# Patient Record
Sex: Female | Born: 1965 | Race: White | Hispanic: No | Marital: Married | Smoking: Never smoker
Health system: Southern US, Community
[De-identification: ages and names within clinical notes are randomized; demographics above are authoritative.]

## PROBLEM LIST (undated history)

## (undated) ENCOUNTER — Ambulatory Visit
Admission: EM | Disposition: A | Discharge status: Other Acute Inpatient Hospital | Payer: No Typology Code available for payment source

## (undated) DIAGNOSIS — M1990 Unspecified osteoarthritis, unspecified site: Secondary | ICD-10-CM

## (undated) DIAGNOSIS — R9431 Abnormal electrocardiogram [ECG] [EKG]: Secondary | ICD-10-CM

## (undated) DIAGNOSIS — I4711 Inappropriate sinus tachycardia, so stated: Secondary | ICD-10-CM

## (undated) DIAGNOSIS — G43909 Migraine, unspecified, not intractable, without status migrainosus: Secondary | ICD-10-CM

## (undated) DIAGNOSIS — A498 Other bacterial infections of unspecified site: Secondary | ICD-10-CM

## (undated) DIAGNOSIS — M329 Systemic lupus erythematosus, unspecified: Secondary | ICD-10-CM

## (undated) DIAGNOSIS — I5189 Other ill-defined heart diseases: Secondary | ICD-10-CM

## (undated) DIAGNOSIS — T7840XA Allergy, unspecified, initial encounter: Secondary | ICD-10-CM

## (undated) DIAGNOSIS — R002 Palpitations: Secondary | ICD-10-CM

## (undated) DIAGNOSIS — R000 Tachycardia, unspecified: Secondary | ICD-10-CM

## (undated) DIAGNOSIS — K219 Gastro-esophageal reflux disease without esophagitis: Secondary | ICD-10-CM

## (undated) DIAGNOSIS — K259 Gastric ulcer, unspecified as acute or chronic, without hemorrhage or perforation: Secondary | ICD-10-CM

## (undated) DIAGNOSIS — J45909 Unspecified asthma, uncomplicated: Secondary | ICD-10-CM

## (undated) DIAGNOSIS — A630 Anogenital (venereal) warts: Secondary | ICD-10-CM

## (undated) DIAGNOSIS — F32A Depression, unspecified: Secondary | ICD-10-CM

## (undated) DIAGNOSIS — F329 Major depressive disorder, single episode, unspecified: Secondary | ICD-10-CM

## (undated) HISTORY — DX: Other ill-defined heart diseases: I51.89

## (undated) HISTORY — DX: Unspecified osteoarthritis, unspecified site: M19.90

## (undated) HISTORY — DX: Other bacterial infections of unspecified site: A49.8

## (undated) HISTORY — DX: Depression, unspecified: F32.A

## (undated) HISTORY — DX: Migraine, unspecified, not intractable, without status migrainosus: G43.909

## (undated) HISTORY — DX: Inappropriate sinus tachycardia, so stated: I47.11

## (undated) HISTORY — PX: UMBILICAL HERNIA REPAIR: SHX196

## (undated) HISTORY — DX: Tachycardia, unspecified: R00.0

## (undated) HISTORY — DX: Unspecified asthma, uncomplicated: J45.909

## (undated) HISTORY — DX: Anogenital (venereal) warts: A63.0

## (undated) HISTORY — DX: Abnormal electrocardiogram (ECG) (EKG): R94.31

## (undated) HISTORY — DX: Systemic lupus erythematosus, unspecified: M32.9

## (undated) HISTORY — DX: Gastric ulcer, unspecified as acute or chronic, without hemorrhage or perforation: K25.9

## (undated) HISTORY — DX: Palpitations: R00.2

## (undated) HISTORY — DX: Gastro-esophageal reflux disease without esophagitis: K21.9

## (undated) HISTORY — PX: ENDOSCOPIC VEIN LASER TREATMENT: SHX1508

## (undated) HISTORY — DX: Allergy, unspecified, initial encounter: T78.40XA

## (undated) HISTORY — PX: BREAST CYST ASPIRATION: SHX578

## (undated) HISTORY — DX: Major depressive disorder, single episode, unspecified: F32.9

---

## 2006-12-06 HISTORY — PX: CATARACT EXTRACTION: SUR2

## 2012-12-06 LAB — HM COLONOSCOPY

## 2014-07-04 HISTORY — PX: OTHER SURGICAL HISTORY: SHX169

## 2015-09-06 LAB — HM MAMMOGRAPHY: HM Mammogram: NORMAL (ref 0–4)

## 2015-09-30 ENCOUNTER — Ambulatory Visit: Payer: Self-pay | Admitting: Cardiovascular Disease

## 2015-10-22 ENCOUNTER — Ambulatory Visit
Admission: RE | Admit: 2015-10-22 | Discharge: 2015-10-22 | Disposition: A | Discharge status: Home / Self Care | Payer: BLUE CROSS/BLUE SHIELD | Source: Ambulatory Visit | Attending: Occupational Medicine | Admitting: Occupational Medicine

## 2015-10-22 ENCOUNTER — Other Ambulatory Visit: Payer: Self-pay | Admitting: Occupational Medicine

## 2015-10-22 DIAGNOSIS — R7611 Nonspecific reaction to tuberculin skin test without active tuberculosis: Secondary | ICD-10-CM | POA: Insufficient documentation

## 2015-11-11 ENCOUNTER — Ambulatory Visit (INDEPENDENT_AMBULATORY_CARE_PROVIDER_SITE_OTHER): Payer: BLUE CROSS/BLUE SHIELD | Admitting: Family Medicine

## 2015-11-11 ENCOUNTER — Encounter: Payer: Self-pay | Admitting: Family Medicine

## 2015-11-11 VITALS — BP 100/60 | HR 117 | Temp 98.2°F | Ht 65.25 in | Wt 147.2 lb

## 2015-11-11 DIAGNOSIS — F418 Other specified anxiety disorders: Secondary | ICD-10-CM | POA: Diagnosis not present

## 2015-11-11 DIAGNOSIS — R000 Tachycardia, unspecified: Secondary | ICD-10-CM

## 2015-11-11 DIAGNOSIS — Z13220 Encounter for screening for lipoid disorders: Secondary | ICD-10-CM | POA: Diagnosis not present

## 2015-11-11 DIAGNOSIS — M329 Systemic lupus erythematosus, unspecified: Secondary | ICD-10-CM | POA: Diagnosis not present

## 2015-11-11 DIAGNOSIS — J4540 Moderate persistent asthma, uncomplicated: Secondary | ICD-10-CM | POA: Insufficient documentation

## 2015-11-11 DIAGNOSIS — Z0000 Encounter for general adult medical examination without abnormal findings: Secondary | ICD-10-CM | POA: Diagnosis not present

## 2015-11-11 DIAGNOSIS — F331 Major depressive disorder, recurrent, moderate: Secondary | ICD-10-CM | POA: Insufficient documentation

## 2015-11-11 DIAGNOSIS — F3341 Major depressive disorder, recurrent, in partial remission: Secondary | ICD-10-CM | POA: Insufficient documentation

## 2015-11-11 DIAGNOSIS — G4700 Insomnia, unspecified: Secondary | ICD-10-CM | POA: Insufficient documentation

## 2015-11-11 DIAGNOSIS — Z1239 Encounter for other screening for malignant neoplasm of breast: Secondary | ICD-10-CM | POA: Diagnosis not present

## 2015-11-11 DIAGNOSIS — F32A Depression, unspecified: Secondary | ICD-10-CM

## 2015-11-11 DIAGNOSIS — J4530 Mild persistent asthma, uncomplicated: Secondary | ICD-10-CM

## 2015-11-11 DIAGNOSIS — F419 Anxiety disorder, unspecified: Secondary | ICD-10-CM

## 2015-11-11 DIAGNOSIS — K219 Gastro-esophageal reflux disease without esophagitis: Secondary | ICD-10-CM | POA: Insufficient documentation

## 2015-11-11 DIAGNOSIS — R7989 Other specified abnormal findings of blood chemistry: Secondary | ICD-10-CM

## 2015-11-11 DIAGNOSIS — F329 Major depressive disorder, single episode, unspecified: Secondary | ICD-10-CM | POA: Insufficient documentation

## 2015-11-11 NOTE — Progress Notes (Signed)
Pre visit review using our clinic review tool, if applicable. No additional management support is needed unless otherwise documented below in the visit note. 

## 2015-11-11 NOTE — Progress Notes (Signed)
Subjective:  Patient ID: Christina Meza, female    DOB: Jul 30, 1966  Age: 49 y.o. MRN: [id number]  CC: Establish care.  HPI Christina Meza is a 49 y.o. female presents to the clinic today to establish care.  Preventative Healthcare  Pap smear: UTD. Last done 9/16.  Mammogram/Breast exam: Patient is due for mammogram. Will schedule today.  Colonoscopy: Up-to-date. Performed in 2014.  Immunizations  Tetanus - 09/2015.  Pneumococcal - patient has not had this vaccination. Does qualify for this given her underlying illnesses.  Flu - UTD.  Zoster - N/A.  Hepatitis C screening - N/A.  Labs: In need of labs today.  Exercise: She reports regular exercise.  Alcohol use: See below.  Smoking/tobacco use: No.  Regular dental exams: Yes.   Wears seat belt: Yes.   PMH, Surgical Hx, Family Hx, Social History reviewed and updated as below.  Past Medical History  Diagnosis Date  . Asthma   . Depression   . Arthritis   . GERD (gastroesophageal reflux disease)   . SLE (systemic lupus erythematosus) (HCC)   . C. difficile diarrhea 1955,8009   Past Surgical History  Procedure Laterality Date  . Vulvar excision for hpv  07/04/2014  . Cataract extraction  2008  . Umbilical hernia repair     Family History  Problem Relation Age of Onset  . Arthritis Mother   . Hypertension Maternal Grandmother   . Hyperlipidemia Maternal Grandfather   . Heart disease Maternal Grandfather   . Stroke Maternal Grandfather   . Hypertension Maternal Grandfather   . Colon cancer Paternal Grandfather     Social History  Substance Use Topics  . Smoking status: Never Smoker   . Smokeless tobacco: Never Used  . Alcohol Use: 0.0 - 0.6 oz/week    0-1 Standard drinks or equivalent per week   Review of Systems  HENT: Positive for ear pain and voice change.   Musculoskeletal:       Muscle cramps/aches. Joint pain/swelling.  Psychiatric/Behavioral:       Anxiety/stress.  All other  systems reviewed and are negative.  Objective:   Today's Vitals: BP 100/60 mmHg  Pulse 117  Temp(Src) 98.2 F (36.8 C) (Oral)  Ht 5' 5.25" (1.657 m)  Wt 147 lb 4 oz (66.792 kg)  BMI 24.33 kg/m2  SpO2 97%  Physical Exam  Constitutional: She is oriented to person, place, and time. She appears well-developed and well-nourished. No distress.  HENT:  Head: Normocephalic and atraumatic.  Nose: Nose normal.  Mouth/Throat: Oropharynx is clear and moist. No oropharyngeal exudate.  Normal TM's bilaterally.   Eyes: Conjunctivae are normal. Pupils are equal, round, and reactive to light. No scleral icterus.  Neck: Neck supple. No thyromegaly present.  Cardiovascular: Regular rhythm.  Tachycardia present.   No murmur heard. Pulmonary/Chest: Effort normal and breath sounds normal. She has no wheezes. She has no rales.  Abdominal: Soft. She exhibits no distension. There is no tenderness. There is no rebound and no guarding.  Musculoskeletal:  Evidence of arthritis in the hands bilaterally.  No obvious joint effusions.  Lymphadenopathy:    She has no cervical adenopathy.  Neurological: She is alert and oriented to person, place, and time.  Skin: Skin is warm and dry. No rash noted.  Psychiatric:  Flat affect and depressed mood.  Vitals reviewed.  Assessment & Plan:   Problem List Items Addressed This Visit    SLE (systemic lupus erythematosus) (HCC)    Patient stable at this time on  Plaquenil, CellCept, and 8 mg of prednisone daily. Patient has had trouble getting to her appointment in [place] with her current rheumatologist given her work schedule. As a result, she would like to see a closer rheumatologist. Will place a referral today.      Relevant Orders   CBC w/Diff   Comp Met (CMET)   TSH   Sinus tachycardia ([place])    Patient has a history of sinus tachycardia. She has already established appointment with Dr. Fletcher Anon.      Preventative health care - Primary    Mammogram  scheduled. Vaccine and tetanus up-to-date. Patient is a candidate for pneumococcal vaccination. This was not discussed today as it was noted after the fact. Will discuss at follow-up visit. Given vulvar excision, patient elects to see GYN for her gynecological needs. Her Pap smear is up-to-date. Ring labs today: CBC, CMP, lipid, TSH.       Insomnia   GERD (gastroesophageal reflux disease)   Relevant Medications   ondansetron (ZOFRAN) 4 MG tablet   Probiotic Product (PROBIOTIC DAILY PO)   ranitidine (ZANTAC) 300 MG tablet   Asthma, chronic   Relevant Medications   predniSONE (DELTASONE) 10 MG tablet   predniSONE (DELTASONE) 10 MG tablet   mometasone (ASMANEX) 220 MCG/INH inhaler   Anxiety and depression    Other Visit Diagnoses    Breast cancer screening        Relevant Orders    MM Digital Screening    Screening, lipid        Relevant Orders    Lipid panel      Outpatient Encounter Prescriptions as of 11/11/2015  Medication Sig  . Biotin 5000 MCG TABS Take by mouth.  . cetirizine (ZYRTEC) 10 MG tablet Take 10 mg by mouth at bedtime.  . Diclofenac Sodium (VOLTAREN OP) Apply to eye.  . hydroxychloroquine (PLAQUENIL) 200 MG tablet Take 400 mg by mouth every other day.  . metronidazole (NORITATE) 1 % cream Apply topically daily.  . mometasone (ASMANEX) 220 MCG/INH inhaler Inhale 2 puffs into the lungs daily.  . Multiple Vitamins-Minerals (MULTIVITAMIN WITH MINERALS) tablet Take 1 tablet by mouth daily.  . mycophenolate (CELLCEPT) 500 MG tablet Take 500 mg by mouth daily.  . ondansetron (ZOFRAN) 4 MG tablet Take 4 mg by mouth every 8 (eight) hours as needed for nausea or vomiting.  . predniSONE (DELTASONE) 10 MG tablet Take 10 mg by mouth every other day.  . predniSONE (DELTASONE) 10 MG tablet Take 9 mg by mouth every other day.  . Probiotic Product (PROBIOTIC DAILY PO) Take by mouth.  . ranitidine (ZANTAC) 300 MG tablet Take 300 mg by mouth at bedtime.  . sertraline (ZOLOFT) 50  MG tablet Take 50 mg by mouth daily.  . valACYclovir (VALTREX) 500 MG tablet Take 500 mg by mouth at bedtime.  . verapamil (CALAN) 120 MG tablet Take 120 mg by mouth at bedtime.  . vitamin E 400 UNIT capsule Take 400 Units by mouth daily.  Marland Kitchen zolpidem (AMBIEN) 5 MG tablet Take 5 mg by mouth daily.   No facility-administered encounter medications on file as of 11/11/2015.   Follow-up: Return in about 6 months (around 05/11/2016).  [place]

## 2015-11-11 NOTE — Patient Instructions (Addendum)
It was nice to see you today.  We will call with your rheumatology referral and the eye referral.  Below is a list of gyn practices in the area. There are also some great ones in [place]. [facility]).  The eye group is [facility], near the hospital.    We will call with your lab results.  Follow up:  Return in about 6 months (around 05/11/2016). (or sooner if needed).  Take care  Dr. Adriana Simas

## 2015-11-12 ENCOUNTER — Encounter: Payer: Self-pay | Admitting: Family Medicine

## 2015-11-12 DIAGNOSIS — R000 Tachycardia, unspecified: Secondary | ICD-10-CM | POA: Insufficient documentation

## 2015-11-12 LAB — COMPREHENSIVE METABOLIC PANEL
ALBUMIN: 3.9 g/dL (ref 3.5–5.2)
ALK PHOS: 76 U/L (ref 39–117)
ALT: 21 U/L (ref 0–35)
AST: 30 U/L (ref 0–37)
BUN: 11 mg/dL (ref 6–23)
CO2: 25 mEq/L (ref 19–32)
CREATININE: 0.9 mg/dL (ref 0.40–1.20)
Calcium: 9.6 mg/dL (ref 8.4–10.5)
Chloride: 104 mEq/L (ref 96–112)
GFR: 70.61 mL/min (ref >60.00)
GLUCOSE: 93 mg/dL (ref 70–99)
Potassium: 4.8 mEq/L (ref 3.5–5.1)
SODIUM: 140 meq/L (ref 135–145)
TOTAL PROTEIN: 7 g/dL (ref 6.0–8.3)
Total Bilirubin: 0.4 mg/dL (ref 0.2–1.2)

## 2015-11-12 LAB — CBC WITH DIFFERENTIAL/PLATELET
BASOS ABS: 0 10*3/uL (ref 0.0–0.1)
Basophils Relative: 0.1 % (ref 0.0–3.0)
EOS ABS: 0 10*3/uL (ref 0.0–0.7)
EOS PCT: 0.2 % (ref 0.0–5.0)
HCT: 44.6 % (ref 36.0–46.0)
HEMOGLOBIN: 14.4 g/dL (ref 12.0–15.0)
Lymphocytes Relative: 12 % (ref 12.0–46.0)
Lymphs Abs: 0.4 10*3/uL — ABNORMAL LOW (ref 0.7–4.0)
MCHC: 32.2 g/dL (ref 30.0–36.0)
MCV: 103 fl — ABNORMAL HIGH (ref 78.0–100.0)
MONO ABS: 0.3 10*3/uL (ref 0.1–1.0)
Monocytes Relative: 8.9 % (ref 3.0–12.0)
Neutro Abs: 2.4 10*3/uL (ref 1.4–7.7)
Neutrophils Relative %: 78.8 % — ABNORMAL HIGH (ref 43.0–77.0)
Platelets: 223 10*3/uL (ref 150.0–400.0)
RBC: 4.33 Mil/uL (ref 3.87–5.11)
RDW: 14.1 % (ref 11.5–15.5)
WBC: 3.1 10*3/uL — AB (ref 4.0–10.5)

## 2015-11-12 LAB — LIPID PANEL
CHOL/HDL RATIO: 4
Cholesterol: 186 mg/dL (ref 0–200)
HDL: 42.8 mg/dL (ref >39.00)
NonHDL: 142.92
Triglycerides: 217 mg/dL — ABNORMAL HIGH (ref 0.0–149.0)
VLDL: 43.4 mg/dL — AB (ref 0.0–40.0)

## 2015-11-12 LAB — LDL CHOLESTEROL, DIRECT: Direct LDL: 110 mg/dL

## 2015-11-12 LAB — TSH: TSH: 1.55 u[IU]/mL (ref 0.35–4.50)

## 2015-11-12 NOTE — Assessment & Plan Note (Signed)
Patient has a history of sinus tachycardia. She has already established appointment with Dr. Kirke Corin.

## 2015-11-12 NOTE — Assessment & Plan Note (Signed)
Mammogram scheduled. Vaccine and tetanus up-to-date. Patient is a candidate for pneumococcal vaccination. This was not discussed today as it was noted after the fact. Will discuss at follow-up visit. Given vulvar excision, patient elects to see GYN for her gynecological needs. Her Pap smear is up-to-date. Ring labs today: CBC, CMP, lipid, TSH.

## 2015-11-12 NOTE — Assessment & Plan Note (Addendum)
Patient stable at this time on Plaquenil, CellCept, and 8 mg of prednisone daily. Patient has had trouble getting to her appointment in [place] with her current rheumatologist given her work schedule. As a result, she would like to see a closer rheumatologist. Will place a referral today. Initially, placing referral to ophthalmology as she needs an eye exam given the fact that she's on Plaquenil.

## 2015-11-17 ENCOUNTER — Ambulatory Visit (INDEPENDENT_AMBULATORY_CARE_PROVIDER_SITE_OTHER): Payer: BLUE CROSS/BLUE SHIELD | Admitting: Cardiovascular Disease

## 2015-11-17 ENCOUNTER — Encounter: Payer: Self-pay | Admitting: Cardiovascular Disease

## 2015-11-17 ENCOUNTER — Encounter (INDEPENDENT_AMBULATORY_CARE_PROVIDER_SITE_OTHER): Payer: Self-pay

## 2015-11-17 VITALS — BP 98/70 | HR 100 | Ht 65.0 in | Wt 148.2 lb

## 2015-11-17 DIAGNOSIS — R000 Tachycardia, unspecified: Secondary | ICD-10-CM

## 2015-11-17 DIAGNOSIS — R002 Palpitations: Secondary | ICD-10-CM

## 2015-11-17 DIAGNOSIS — R0602 Shortness of breath: Secondary | ICD-10-CM | POA: Diagnosis not present

## 2015-11-17 MED ORDER — VERAPAMIL HCL ER 120 MG PO TBCR: 120.0000 mg | EXTENDED_RELEASE_TABLET | Freq: Every day | ORAL | Status: DC

## 2015-11-17 NOTE — Assessment & Plan Note (Addendum)
I spent 30 minutes reviewing her previous records. She has history of inappropriate sinus tachycardia with stable symptoms on current dose of verapamil. I made no changes and refill her medication. The patient does have known history of SLE and I will consider a follow-up echocardiogram next year to make sure she does not have any cardiac involvement. Most recent echocardiogram in 2014 showed no significant abnormalities.

## 2015-11-17 NOTE — Patient Instructions (Signed)
Medication Instructions: No change.   Labwork: None.   Procedures/Testing: None.   Follow-Up: 1 year with Dr. Vincentina Sollers  Any Additional Special Instructions Will Be Listed Below (If Applicable).   

## 2015-11-17 NOTE — Progress Notes (Signed)
HPI  This is a pleasant 49 year old female who is here today to establish cardiovascular care. She moved from [place]. She is a Scientist, clinical (histocompatibility and immunogenetics) at [employer]. She has known history of inappropriate sinus tachycardia treated with verapamil. She has chronic medical conditions that include systemic lupus on chronic immunosuppressive therapy including prednisone, CellCept and Plaquenil. She also has asthma. She is not a smoker and has no family history of heart disease. Her symptoms have been reasonably controlled with verapamil. She does give out of breath with activities. She is usually able to go up 2 flights of stairs but if any more than that she definitely gets dyspneic. No chest pain. Most recent echocardiogram in November 2014 showed normal LV systolic function with mild mitral and tricuspid regurgitation. She had a nuclear stress test in 2010 which showed no evidence of ischemia. No chest pain, orthopnea, dizziness or syncope.   Allergies  Allergen Reactions  . Bactrim [Sulfamethoxazole-Trimethoprim]   . Celebrex [Celecoxib]   . Lasix [Furosemide]   . Sulfa Antibiotics      Current Outpatient Prescriptions on File Prior to Visit  Medication Sig Dispense Refill  . Biotin 5000 MCG TABS Take by mouth.    . cetirizine (ZYRTEC) 10 MG tablet Take 10 mg by mouth at bedtime.    . hydroxychloroquine (PLAQUENIL) 200 MG tablet Take 400 mg by mouth every other day.    . metronidazole (NORITATE) 1 % cream Apply topically daily.    . mometasone (ASMANEX) 220 MCG/INH inhaler Inhale 2 puffs into the lungs daily.    . Multiple Vitamins-Minerals (MULTIVITAMIN WITH MINERALS) tablet Take 1 tablet by mouth daily.    . mycophenolate (CELLCEPT) 500 MG tablet Take 500 mg by mouth 2 (two) times daily.     . ondansetron (ZOFRAN) 4 MG tablet Take 4 mg by mouth every 8 (eight) hours as needed for nausea or vomiting.    . predniSONE (DELTASONE) 10 MG tablet Take 8 mg by mouth every other day.     . Probiotic Product  (PROBIOTIC DAILY PO) Take by mouth.    . ranitidine (ZANTAC) 300 MG tablet Take 300 mg by mouth at bedtime.    . sertraline (ZOLOFT) 50 MG tablet Take 50 mg by mouth daily.    . valACYclovir (VALTREX) 500 MG tablet Take 500 mg by mouth at bedtime.    . vitamin E 400 UNIT capsule Take 400 Units by mouth daily.    Marland Kitchen zolpidem (AMBIEN) 5 MG tablet Take 5 mg by mouth daily.     No current facility-administered medications on file prior to visit.     Past Medical History  Diagnosis Date  . Asthma   . Depression   . Arthritis   . GERD (gastroesophageal reflux disease)   . SLE (systemic lupus erythematosus) (HCC)   . C. difficile diarrhea 1610,9604  . Palpitations   . Inappropriate sinus tachycardia [facility])      Past Surgical History  Procedure Laterality Date  . Vulvar excision for hpv  07/04/2014  . Cataract extraction  2008  . Umbilical hernia repair       Family History  Problem Relation Age of Onset  . Arthritis Mother   . Hypertension Maternal Grandmother   . Hyperlipidemia Maternal Grandfather   . Heart disease Maternal Grandfather   . Stroke Maternal Grandfather   . Hypertension Maternal Grandfather   . Colon cancer Paternal Grandfather      Social History   Social History  . Marital Status: Married  Spouse Name: N/A  . Number of Children: N/A  . Years of Education: N/A   Occupational History  . Not on file.   Social History Main Topics  . Smoking status: Never Smoker   . Smokeless tobacco: Never Used  . Alcohol Use: 0.0 - 0.6 oz/week    0-1 Standard drinks or equivalent per week  . Drug Use: No  . Sexual Activity:    Partners: Male   Other Topics Concern  . 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        ROS A 10 point review of system was performed. It is negative other than that mentioned in the history of present illness.   PHYSICAL EXAM   BP 98/70 mmHg  Pulse 100  Ht 5\' 5"  (1.651 m)  Wt 148 lb 4 oz (67.246 kg)  BMI  24.67 kg/m2 Constitutional: She is oriented to person, place, and time. She appears well-developed and well-nourished. No distress.  HENT: No nasal discharge.  Head: Normocephalic and atraumatic.  Eyes: Pupils are equal and round. No discharge.  Neck: Normal range of motion. Neck supple. No JVD present. No thyromegaly present.  Cardiovascular: Normal rate, regular rhythm, normal heart sounds. Exam reveals no gallop and no friction rub. No murmur heard.  Pulmonary/Chest: Effort normal and breath sounds normal. No stridor. No respiratory distress. She has no wheezes. She has no rales. She exhibits no tenderness.  Abdominal: Soft. Bowel sounds are normal. She exhibits no distension. There is no tenderness. There is no rebound and no guarding.  Musculoskeletal: Normal range of motion. She exhibits no edema and no tenderness.  Neurological: She is alert and oriented to person, place, and time. Coordination normal.  Skin: Skin is warm and dry. No rash noted. She is not diaphoretic. No erythema. No pallor.  Psychiatric: She has a normal mood and affect. Her behavior is normal. Judgment and thought content normal.     EKG: Normal sinus rhythm with mildly prolonged QT interval. No significant ST or T wave changes.   ASSESSMENT AND PLAN

## 2015-11-26 ENCOUNTER — Ambulatory Visit
Admission: RE | Admit: 2015-11-26 | Discharge: 2015-11-26 | Disposition: A | Discharge status: Home / Self Care | Payer: BLUE CROSS/BLUE SHIELD | Source: Ambulatory Visit | Attending: Family Medicine | Admitting: Family Medicine

## 2015-11-26 DIAGNOSIS — Z1231 Encounter for screening mammogram for malignant neoplasm of breast: Secondary | ICD-10-CM | POA: Diagnosis present

## 2015-11-26 DIAGNOSIS — Z1239 Encounter for other screening for malignant neoplasm of breast: Secondary | ICD-10-CM

## 2015-12-16 DIAGNOSIS — Z7952 Long term (current) use of systemic steroids: Secondary | ICD-10-CM | POA: Insufficient documentation

## 2015-12-16 DIAGNOSIS — R768 Other specified abnormal immunological findings in serum: Secondary | ICD-10-CM | POA: Insufficient documentation

## 2015-12-16 DIAGNOSIS — I7300 Raynaud's syndrome without gangrene: Secondary | ICD-10-CM | POA: Insufficient documentation

## 2016-03-14 ENCOUNTER — Emergency Department: Payer: BLUE CROSS/BLUE SHIELD

## 2016-03-14 ENCOUNTER — Inpatient Hospital Stay
Admission: EM | Admit: 2016-03-14 | Discharge: 2016-03-16 | DRG: 872 | Disposition: A | Discharge status: Home / Self Care | Payer: BLUE CROSS/BLUE SHIELD | Attending: Internal Medicine | Admitting: Internal Medicine

## 2016-03-14 DIAGNOSIS — Z8249 Family history of ischemic heart disease and other diseases of the circulatory system: Secondary | ICD-10-CM | POA: Diagnosis not present

## 2016-03-14 DIAGNOSIS — Z823 Family history of stroke: Secondary | ICD-10-CM | POA: Diagnosis not present

## 2016-03-14 DIAGNOSIS — A419 Sepsis, unspecified organism: Secondary | ICD-10-CM | POA: Diagnosis present

## 2016-03-14 DIAGNOSIS — I10 Essential (primary) hypertension: Secondary | ICD-10-CM | POA: Diagnosis present

## 2016-03-14 DIAGNOSIS — Z8261 Family history of arthritis: Secondary | ICD-10-CM

## 2016-03-14 DIAGNOSIS — K529 Noninfective gastroenteritis and colitis, unspecified: Secondary | ICD-10-CM | POA: Diagnosis present

## 2016-03-14 DIAGNOSIS — Z800 Family history of malignant neoplasm of digestive organs: Secondary | ICD-10-CM | POA: Diagnosis not present

## 2016-03-14 DIAGNOSIS — I7300 Raynaud's syndrome without gangrene: Secondary | ICD-10-CM | POA: Diagnosis present

## 2016-03-14 DIAGNOSIS — J45909 Unspecified asthma, uncomplicated: Secondary | ICD-10-CM | POA: Diagnosis present

## 2016-03-14 DIAGNOSIS — K219 Gastro-esophageal reflux disease without esophagitis: Secondary | ICD-10-CM | POA: Diagnosis present

## 2016-03-14 DIAGNOSIS — M329 Systemic lupus erythematosus, unspecified: Secondary | ICD-10-CM | POA: Diagnosis present

## 2016-03-14 LAB — CBC WITH DIFFERENTIAL/PLATELET
BLASTS: 0 %
Band Neutrophils: 13 %
Basophils Absolute: 0 10*3/uL (ref 0–0.1)
Basophils Relative: 0 %
Eosinophils Absolute: 0 10*3/uL (ref 0–0.7)
Eosinophils Relative: 0 %
HCT: 38.2 % (ref 35.0–47.0)
Hemoglobin: 12.5 g/dL (ref 12.0–16.0)
LYMPHS ABS: 0.3 10*3/uL — AB (ref 1.0–3.6)
LYMPHS PCT: 4 %
MCH: 31.5 pg (ref 26.0–34.0)
MCHC: 32.7 g/dL (ref 32.0–36.0)
MCV: 96.4 fL (ref 80.0–100.0)
MONOS PCT: 3 %
Metamyelocytes Relative: 0 %
Monocytes Absolute: 0.2 10*3/uL (ref 0.2–0.9)
Myelocytes: 0 %
NEUTROS ABS: 7.3 10*3/uL — AB (ref 1.4–6.5)
NRBC: 0 /100{WBCs}
Neutrophils Relative %: 80 %
Other: 0 %
PLATELETS: 227 10*3/uL (ref 150–440)
Promyelocytes Absolute: 0 %
RBC: 3.96 MIL/uL (ref 3.80–5.20)
RDW: 13.3 % (ref 11.5–14.5)
WBC: 7.8 10*3/uL (ref 3.6–11.0)

## 2016-03-14 LAB — COMPREHENSIVE METABOLIC PANEL
ALBUMIN: 3.6 g/dL (ref 3.5–5.0)
ALT: 17 U/L (ref 14–54)
ANION GAP: 6 (ref 5–15)
AST: 33 U/L (ref 15–41)
Alkaline Phosphatase: 75 U/L (ref 38–126)
BILIRUBIN TOTAL: 0.6 mg/dL (ref 0.3–1.2)
BUN: 23 mg/dL — ABNORMAL HIGH (ref 6–20)
CO2: 23 mmol/L (ref 22–32)
Calcium: 8.4 mg/dL — ABNORMAL LOW (ref 8.9–10.3)
Chloride: 103 mmol/L (ref 101–111)
Creatinine, Ser: 0.84 mg/dL (ref 0.44–1.00)
GFR calc Af Amer: >60 mL/min (ref >60)
GLUCOSE: 102 mg/dL — AB (ref 65–99)
POTASSIUM: 3.8 mmol/L (ref 3.5–5.1)
Sodium: 132 mmol/L — ABNORMAL LOW (ref 135–145)
TOTAL PROTEIN: 7.4 g/dL (ref 6.5–8.1)

## 2016-03-14 LAB — INFLUENZA PANEL BY PCR (TYPE A & B)
H1N1FLUPCR: NOT DETECTED
Influenza A By PCR: NEGATIVE
Influenza B By PCR: NEGATIVE

## 2016-03-14 LAB — URINALYSIS COMPLETE WITH MICROSCOPIC (ARMC ONLY)
BILIRUBIN URINE: NEGATIVE
Bacteria, UA: NONE SEEN
Glucose, UA: NEGATIVE mg/dL
Hgb urine dipstick: NEGATIVE
KETONES UR: NEGATIVE mg/dL
Leukocytes, UA: NEGATIVE
NITRITE: NEGATIVE
Protein, ur: NEGATIVE mg/dL
SPECIFIC GRAVITY, URINE: 1.024 (ref 1.005–1.030)
Squamous Epithelial / LPF: NONE SEEN
pH: 5 (ref 5.0–8.0)

## 2016-03-14 LAB — RAPID INFLUENZA A&B ANTIGENS
Influenza A (ARMC): NEGATIVE
Influenza B (ARMC): NEGATIVE

## 2016-03-14 LAB — C DIFFICILE QUICK SCREEN W PCR REFLEX
C DIFFICILE (CDIFF) INTERP: NEGATIVE
C DIFFICILE (CDIFF) TOXIN: NEGATIVE
C DIFFICLE (CDIFF) ANTIGEN: NEGATIVE

## 2016-03-14 LAB — SEDIMENTATION RATE: Sed Rate: 54 mm/hr — ABNORMAL HIGH (ref 0–20)

## 2016-03-14 LAB — LACTIC ACID, PLASMA: LACTIC ACID, VENOUS: 1.7 mmol/L (ref 0.5–2.0)

## 2016-03-14 LAB — POCT RAPID STREP A: STREPTOCOCCUS, GROUP A SCREEN (DIRECT): NEGATIVE

## 2016-03-14 MED ORDER — ACETAMINOPHEN 650 MG RE SUPP: 650.0000 mg | RECTAL | Status: DC | PRN

## 2016-03-14 MED ORDER — SODIUM CHLORIDE 0.9 % IV BOLUS (SEPSIS)
1000.0000 mL | INTRAVENOUS | Status: AC
  Administered 2016-03-14 (×2): 1000 mL via INTRAVENOUS

## 2016-03-14 MED ORDER — VALACYCLOVIR HCL 500 MG PO TABS
500.0000 mg | ORAL_TABLET | Freq: Every day | ORAL | Status: DC
  Administered 2016-03-14 – 2016-03-15 (×2): 500 mg via ORAL
  Filled 2016-03-14 (×2): qty 1

## 2016-03-14 MED ORDER — HYDROXYCHLOROQUINE SULFATE 200 MG PO TABS: 400.0000 mg | ORAL_TABLET | ORAL | Status: DC

## 2016-03-14 MED ORDER — VANCOMYCIN HCL IN DEXTROSE 1-5 GM/200ML-% IV SOLN
1000.0000 mg | Freq: Once | INTRAVENOUS | Status: AC
  Administered 2016-03-14: 1000 mg via INTRAVENOUS
  Filled 2016-03-14: qty 200

## 2016-03-14 MED ORDER — METHYLPREDNISOLONE SODIUM SUCC 40 MG IJ SOLR
40.0000 mg | Freq: Every day | INTRAMUSCULAR | Status: DC
  Administered 2016-03-14 – 2016-03-16 (×3): 40 mg via INTRAVENOUS
  Filled 2016-03-14 (×3): qty 1

## 2016-03-14 MED ORDER — MYCOPHENOLATE MOFETIL 500 MG PO TABS
500.0000 mg | ORAL_TABLET | Freq: Two times a day (BID) | ORAL | Status: DC
  Administered 2016-03-14: 500 mg via ORAL
  Filled 2016-03-14 (×3): qty 1

## 2016-03-14 MED ORDER — PIPERACILLIN-TAZOBACTAM 3.375 G IVPB
3.3750 g | Freq: Three times a day (TID) | INTRAVENOUS | Status: DC
  Administered 2016-03-14 – 2016-03-15 (×3): 3.375 g via INTRAVENOUS
  Filled 2016-03-14 (×4): qty 50

## 2016-03-14 MED ORDER — ONDANSETRON HCL 4 MG PO TABS
4.0000 mg | ORAL_TABLET | Freq: Four times a day (QID) | ORAL | Status: DC | PRN
  Administered 2016-03-16: 4 mg via ORAL
  Filled 2016-03-14: qty 1

## 2016-03-14 MED ORDER — PIPERACILLIN-TAZOBACTAM 3.375 G IVPB 30 MIN
3.3750 g | Freq: Once | INTRAVENOUS | Status: AC
  Administered 2016-03-14: 3.375 g via INTRAVENOUS
  Filled 2016-03-14: qty 50

## 2016-03-14 MED ORDER — ZOLPIDEM TARTRATE 5 MG PO TABS
5.0000 mg | ORAL_TABLET | Freq: Every evening | ORAL | Status: DC | PRN
  Administered 2016-03-14: 5 mg via ORAL
  Filled 2016-03-14: qty 1

## 2016-03-14 MED ORDER — MOMETASONE FUROATE 220 MCG/INH IN AEPB: 2.0000 | INHALATION_SPRAY | Freq: Every day | RESPIRATORY_TRACT | Status: DC

## 2016-03-14 MED ORDER — SODIUM CHLORIDE 0.9 % IV BOLUS (SEPSIS)
500.0000 mL | INTRAVENOUS | Status: AC
  Administered 2016-03-14: 500 mL via INTRAVENOUS

## 2016-03-14 MED ORDER — SODIUM CHLORIDE 0.9 % IV SOLN
INTRAVENOUS | Status: DC
  Administered 2016-03-14 – 2016-03-16 (×6): via INTRAVENOUS

## 2016-03-14 MED ORDER — ADULT MULTIVITAMIN W/MINERALS CH
1.0000 | ORAL_TABLET | Freq: Every day | ORAL | Status: DC
  Administered 2016-03-14 – 2016-03-16 (×3): 1 via ORAL
  Filled 2016-03-14 (×4): qty 1

## 2016-03-14 MED ORDER — HYDROXYCHLOROQUINE SULFATE 200 MG PO TABS
200.0000 mg | ORAL_TABLET | ORAL | Status: DC
  Administered 2016-03-14 – 2016-03-16 (×2): 200 mg via ORAL
  Filled 2016-03-14 (×2): qty 1

## 2016-03-14 MED ORDER — LORATADINE 10 MG PO TABS
10.0000 mg | ORAL_TABLET | Freq: Every day | ORAL | Status: DC
  Administered 2016-03-14 – 2016-03-16 (×3): 10 mg via ORAL
  Filled 2016-03-14 (×4): qty 1

## 2016-03-14 MED ORDER — VITAMIN E 45 MG (100 UNIT) PO CAPS
400.0000 [IU] | ORAL_CAPSULE | Freq: Every day | ORAL | Status: DC
  Administered 2016-03-14 – 2016-03-16 (×3): 400 [IU] via ORAL
  Filled 2016-03-14 (×3): qty 4

## 2016-03-14 MED ORDER — VANCOMYCIN HCL IN DEXTROSE 750-5 MG/150ML-% IV SOLN
750.0000 mg | Freq: Two times a day (BID) | INTRAVENOUS | Status: DC
  Administered 2016-03-14 – 2016-03-15 (×2): 750 mg via INTRAVENOUS
  Filled 2016-03-14 (×3): qty 150

## 2016-03-14 MED ORDER — ENOXAPARIN SODIUM 40 MG/0.4ML ~~LOC~~ SOLN
40.0000 mg | SUBCUTANEOUS | Status: DC
  Administered 2016-03-14 – 2016-03-15 (×2): 40 mg via SUBCUTANEOUS
  Filled 2016-03-14 (×2): qty 0.4

## 2016-03-14 MED ORDER — ONDANSETRON HCL 4 MG/2ML IJ SOLN
4.0000 mg | Freq: Four times a day (QID) | INTRAMUSCULAR | Status: DC | PRN
  Administered 2016-03-14: 4 mg via INTRAVENOUS
  Filled 2016-03-14: qty 2

## 2016-03-14 MED ORDER — VERAPAMIL HCL ER 120 MG PO TBCR
120.0000 mg | EXTENDED_RELEASE_TABLET | Freq: Every day | ORAL | Status: DC
  Administered 2016-03-14 – 2016-03-15 (×2): 120 mg via ORAL
  Filled 2016-03-14 (×3): qty 1

## 2016-03-14 MED ORDER — ACETAMINOPHEN 325 MG PO TABS
650.0000 mg | ORAL_TABLET | Freq: Four times a day (QID) | ORAL | Status: DC | PRN
  Administered 2016-03-15: 650 mg via ORAL
  Filled 2016-03-14: qty 2

## 2016-03-14 MED ORDER — SERTRALINE HCL 50 MG PO TABS
50.0000 mg | ORAL_TABLET | Freq: Every day | ORAL | Status: DC
  Administered 2016-03-14 – 2016-03-16 (×3): 50 mg via ORAL
  Filled 2016-03-14 (×4): qty 1

## 2016-03-14 MED ORDER — FAMOTIDINE 20 MG PO TABS
40.0000 mg | ORAL_TABLET | Freq: Every day | ORAL | Status: DC
  Administered 2016-03-14 – 2016-03-15 (×2): 40 mg via ORAL
  Filled 2016-03-14 (×2): qty 2

## 2016-03-14 MED ORDER — BUDESONIDE 0.25 MG/2ML IN SUSP
0.2500 mg | Freq: Two times a day (BID) | RESPIRATORY_TRACT | Status: DC
  Administered 2016-03-14: 0.25 mg via RESPIRATORY_TRACT
  Filled 2016-03-14: qty 2

## 2016-03-14 MED ORDER — VITAMIN D 1000 UNITS PO TABS
2000.0000 [IU] | ORAL_TABLET | Freq: Every day | ORAL | Status: DC
  Administered 2016-03-14 – 2016-03-16 (×3): 2000 [IU] via ORAL
  Filled 2016-03-14 (×4): qty 2

## 2016-03-14 NOTE — ED Notes (Signed)
Strep test negative.

## 2016-03-14 NOTE — H&P (Signed)
[facility] [place] [facility]   PATIENT NAME: Christina Meza    MR#:  [id number]  DATE OF BIRTH:  10-09-1966  DATE OF ADMISSION:  03/14/2016  PRIMARY CARE PHYSICIAN: Tommie Sams, DO   REQUESTING/REFERRING PHYSICIAN: Dr. Jene Every  CHIEF COMPLAINT:   Chief Complaint  Patient presents with  . Tachycardia  . Emesis    HISTORY OF PRESENT ILLNESS:  Christina Meza  is a 50 y.o. female with a known history of Systemic lupus erythematosus, asthma, depression, osteoarthritis, GERD, history of C. difficile colitis, who presents to the hospital due to fatigue, malaise, nausea and vomiting. Patient says that she was in a usual state of health until this morning she developed some nausea and multiple episodes of vomiting which was nonbloody and bilious in nature. It is not any abdominal pain or diarrhea with the symptoms. She does complain of malaise, body aches and chills but did not measure her temperature at home. She was not feeling well and therefore came to the ER for further evaluation and was noted to have a fever of 102 also noted to be tachycardic and suspected to have sepsis but the source is unclear. Patient is on chronic immunosuppressants given her history of lupus and therefore a hospitalist services were contacted for further treatment and evaluation.  PAST MEDICAL HISTORY:   Past Medical History  Diagnosis Date  . Asthma   . Depression   . Arthritis   . GERD (gastroesophageal reflux disease)   . SLE (systemic lupus erythematosus) (HCC)   . C. difficile diarrhea [id number]  . Palpitations   . Inappropriate sinus tachycardia (HCC)     PAST SURGICAL HISTORY:   Past Surgical History  Procedure Laterality Date  . Vulvar excision for hpv  07/04/2014  . Cataract extraction  2008  . Umbilical hernia repair    . Breast cyst aspiration Left 4-5 yrs ago    NEG    SOCIAL HISTORY:   Social History  Substance Use Topics  . Smoking  status: Never Smoker   . Smokeless tobacco: Never Used  . Alcohol Use: 0.0 - 0.6 oz/week    0-1 Standard drinks or equivalent per week    FAMILY HISTORY:   Family History  Problem Relation Age of Onset  . Arthritis Mother   . Hypertension Maternal Grandmother   . Hyperlipidemia Maternal Grandfather   . Heart disease Maternal Grandfather   . Stroke Maternal Grandfather   . Hypertension Maternal Grandfather   . Colon cancer Paternal Grandfather   . Breast cancer Neg Hx     DRUG ALLERGIES:   Allergies  Allergen Reactions  . Bactrim [Sulfamethoxazole-Trimethoprim]   . Celebrex [Celecoxib]   . Lasix [Furosemide]   . Sulfa Antibiotics     REVIEW OF SYSTEMS:   Review of Systems  Constitutional: Positive for fever, chills and malaise/fatigue. Negative for weight loss.  HENT: Negative for congestion, nosebleeds and tinnitus.   Eyes: Negative for blurred vision, double vision and redness.  Respiratory: Positive for shortness of breath. Negative for cough and hemoptysis.   Cardiovascular: Negative for chest pain, orthopnea, leg swelling and PND.  Gastrointestinal: Positive for nausea and vomiting. Negative for abdominal pain, diarrhea and melena.  Genitourinary: Negative for dysuria, urgency and hematuria.  Musculoskeletal: Negative for joint pain and falls.  Neurological: Negative for dizziness, tingling, sensory change, focal weakness, seizures, weakness and headaches.  Endo/Heme/Allergies: Negative for polydipsia. Does not bruise/bleed easily.  Psychiatric/Behavioral: Negative for depression and memory  loss. The patient is not nervous/anxious.     MEDICATIONS AT HOME:   Prior to Admission medications   Medication Sig Start Date End Date Taking? Authorizing Provider  acetaminophen (TYLENOL) 650 MG suppository Place 650 mg rectally every 4 (four) hours as needed.    Historical Provider, MD  Biotin 5000 MCG TABS Take by mouth.    Historical Provider, MD  cetirizine (ZYRTEC)  10 MG tablet Take 10 mg by mouth at bedtime.    Historical Provider, MD  Cholecalciferol (VITAMIN D) 2000 UNITS tablet Take 2,000 Units by mouth daily.    Historical Provider, MD  hydroxychloroquine (PLAQUENIL) 200 MG tablet Take 400 mg by mouth every other day.    Historical Provider, MD  levalbuterol (XOPENEX HFA) 45 MCG/ACT inhaler Inhale 1 puff into the lungs every 4 (four) hours as needed for wheezing.    Historical Provider, MD  metronidazole (NORITATE) 1 % cream Apply topically daily.    Historical Provider, MD  mometasone [facility]) 220 MCG/INH inhaler Inhale 2 puffs into the lungs daily.    Historical Provider, MD  Multiple Vitamins-Minerals (MULTIVITAMIN WITH MINERALS) tablet Take 1 tablet by mouth daily.    Historical Provider, MD  mycophenolate (CELLCEPT) 500 MG tablet Take 500 mg by mouth 2 (two) times daily.     Historical Provider, MD  ondansetron (ZOFRAN) 4 MG tablet Take 4 mg by mouth every 8 (eight) hours as needed for nausea or vomiting.    Historical Provider, MD  predniSONE (DELTASONE) 10 MG tablet Take 8 mg by mouth every other day.     Historical Provider, MD  Probiotic Product (PROBIOTIC DAILY PO) Take by mouth.    Historical Provider, MD  ranitidine (ZANTAC) 300 MG tablet Take 300 mg by mouth at bedtime.    Historical Provider, MD  sertraline (ZOLOFT) 50 MG tablet Take 50 mg by mouth daily.    Historical Provider, MD  valACYclovir (VALTREX) 500 MG tablet Take 500 mg by mouth at bedtime.    Historical Provider, MD  verapamil (CALAN-SR) 120 MG CR tablet Take 1 tablet (120 mg total) by mouth at bedtime. 11/17/15   Iran Ouch, MD  vitamin E 400 UNIT capsule Take 400 Units by mouth daily.    Historical Provider, MD  zolpidem (AMBIEN) 5 MG tablet Take 5 mg by mouth daily.    Historical Provider, MD      VITAL SIGNS:  Blood pressure 112/56, pulse 126, temperature 102.2 F (39 C), temperature source Oral, resp. rate 20, height  (1.626 m), weight 68.04 kg (150 lb),  last menstrual period 07/15/2015, SpO2 98 %.  PHYSICAL EXAMINATION:  Physical Exam  GENERAL:  50 y.o.-year-old patient lying in the bed shivering and in mild distress.  EYES: Pupils equal, round, reactive to light and accommodation. No scleral icterus. Extraocular muscles intact.  HEENT: Head atraumatic, normocephalic. Oropharynx and nasopharynx clear. No oropharyngeal erythema, moist oral mucosa  NECK:  Supple, no jugular venous distention. No thyroid enlargement, no tenderness.  LUNGS: Normal breath sounds bilaterally, no wheezing, rales, rhonchi. No use of accessory muscles of respiration.  CARDIOVASCULAR: S1, S2 RRR. No murmurs, rubs, gallops, clicks.  ABDOMEN: Soft, nontender, nondistended. Bowel sounds present. No organomegaly or mass.  EXTREMITIES: No pedal edema, cyanosis, or clubbing. + 2 pedal & radial pulses b/l.   NEUROLOGIC: Cranial nerves II through XII are intact. No focal Motor or sensory deficits appreciated b/l PSYCHIATRIC: The patient is alert and oriented x 3. Good affect.  SKIN: No obvious  rash, lesion, or ulcer.   LABORATORY PANEL:   CBC  Recent Labs Lab 03/14/16 1233  WBC 7.8  HGB 12.5  HCT 38.2  PLT 227   ------------------------------------------------------------------------------------------------------------------  Chemistries   Recent Labs Lab 03/14/16 1233  NA 132*  K 3.8  CL 103  CO2 23  GLUCOSE 102*  BUN 23*  CREATININE 0.84  CALCIUM 8.4*  AST 33  ALT 17  ALKPHOS 75  BILITOT 0.6   ------------------------------------------------------------------------------------------------------------------  Cardiac Enzymes No results for input(s): TROPONINI in the last 168 hours. ------------------------------------------------------------------------------------------------------------------  RADIOLOGY:  Dg Chest Port 1 View  03/14/2016  CLINICAL DATA:  50 year old with current history of lupus, presenting with nausea and vomiting,  tachycardia and hypotension. EXAM: PORTABLE CHEST 1 VIEW COMPARISON:  10/22/2015. FINDINGS: Suboptimal inspiration accounts for crowded bronchovascular markings diffusely and atelectasis in the bases, and accentuates the cardiac silhouette. Taking this into account, cardiac silhouette likely upper normal in size. Lungs otherwise clear. No localized airspace consolidation. No pleural effusions. No pneumothorax. Normal pulmonary vascularity. IMPRESSION: Suboptimal inspiration accounts for bibasilar atelectasis. No acute cardiopulmonary disease otherwise. Electronically Signed   By: Hulan Saas M.D.   On: 03/14/2016 12:51     IMPRESSION AND PLAN:   49 year old female with past medical history of lupus, GERD, osteoarthritis, history of C. difficile colitis, depression, asthma who presents to the hospital due to fever, malaise, body aches and noted to be septic.  1. Sepsis-patient needs criteria given her fever, tachycardia. The source is still unclear. -Her chest x-ray is negative for any acute abnormalities, urinalysis is negative. She does have nonspecific symptoms consistent with flu but her antigens are negative. I will get a flu for PCR. -Continue supportive care with IV fluids, antipyretics, follow blood, sputum cultures. -Empirically treated with vancomycin and Zosyn. Follow fever curve.  2. Nausea/vomiting-etiology unclear and could be related to the underlying viral illness. -Supportive care with IV fluids, antiemetics. Place on clear liquid diet. If continues to have symptoms we'll get CT scan of abdomen pelvis.  3. History of systemic lupus-continue CellCept, Plaquenil  -I will give her stress dose steroids given her sepsis. -I will get a rheumatology consult to see if this is a lupus flare. I will check a CRP, sedimentation rate.  4. GERD-continue Pepcid.  5. Essential hypertension-continue verapamil.  6. Asthma-no acute exacerbation. Continue Asmanex.    All the records are  reviewed and case discussed with ED provider. Management plans discussed with the patient, family and they are in agreement.  CODE STATUS: Full code  TOTAL TIME TAKING CARE OF THIS PATIENT: 45 minutes.    Houston Siren M.D on 03/14/2016 at 3:44 PM  Between 7am to 6pm - Pager - [phone]  After 6pm go to [email or web address] - password [email or web address] [facility]  [facility]  Office  [phone]  CC: Primary care physician; Tommie Sams, DO

## 2016-03-14 NOTE — ED Notes (Addendum)
History of lupus states with fever vomiting and joint pain since yesterday. Multiple episodes of vomiting today. Husband states she has blacked out a few times today that lasted about 10 secs or more.

## 2016-03-14 NOTE — ED Provider Notes (Signed)
[facility] Emergency Department Provider Note  ____________________________________________    I have reviewed the triage vital signs and the nursing notes.   HISTORY  Chief Complaint Tachycardia and Emesis    HPI Christina Meza is a 50 y.o. female who presents with complaints of nausea vomiting and body aches since yesterday. Patient reports numerous episodes of vomiting today. Husband reports she has "blacked out" several times today. Patient does have a history of lupus she is on CellCept, prednisone and Plaquenil. She has had chills, she is unsure if she has been febrile. She denies short of breath. No recent travel. She has had a mild sore throat.    Past Medical History  Diagnosis Date  . Asthma   . Depression   . Arthritis   . GERD (gastroesophageal reflux disease)   . SLE (systemic lupus erythematosus) (HCC)   . C. difficile diarrhea [id number]  . Palpitations   . Inappropriate sinus tachycardia [facility])     Patient Active Problem List   Diagnosis Date Noted  . Inappropriate sinus tachycardia (HCC) 11/17/2015  . Sinus tachycardia (HCC) 11/12/2015  . SLE (systemic lupus erythematosus) (HCC) 11/11/2015  . Preventative health care 11/11/2015  . Anxiety and depression 11/11/2015  . Asthma, chronic 11/11/2015  . GERD (gastroesophageal reflux disease) 11/11/2015  . Insomnia 11/11/2015    Past Surgical History  Procedure Laterality Date  . Vulvar excision for hpv  07/04/2014  . Cataract extraction  2008  . Umbilical hernia repair    . Breast cyst aspiration Left 4-5 yrs ago    NEG    Current Outpatient Rx  Name  Route  Sig  Dispense  Refill  . acetaminophen (TYLENOL) 650 MG suppository   Rectal   Place 650 mg rectally every 4 (four) hours as needed.         . Biotin 5000 MCG TABS   Oral   Take by mouth.         . cetirizine (ZYRTEC) 10 MG tablet   Oral   Take 10 mg by mouth at bedtime.         . Cholecalciferol (VITAMIN  D) 2000 UNITS tablet   Oral   Take 2,000 Units by mouth daily.         . hydroxychloroquine (PLAQUENIL) 200 MG tablet   Oral   Take 400 mg by mouth every other day.         . levalbuterol (XOPENEX HFA) 45 MCG/ACT inhaler   Inhalation   Inhale 1 puff into the lungs every 4 (four) hours as needed for wheezing.         . metronidazole (NORITATE) 1 % cream   Topical   Apply topically daily.         . mometasone (ASMANEX) 220 MCG/INH inhaler   Inhalation   Inhale 2 puffs into the lungs daily.         . Multiple Vitamins-Minerals (MULTIVITAMIN WITH MINERALS) tablet   Oral   Take 1 tablet by mouth daily.         . mycophenolate (CELLCEPT) 500 MG tablet   Oral   Take 500 mg by mouth 2 (two) times daily.          . ondansetron (ZOFRAN) 4 MG tablet   Oral   Take 4 mg by mouth every 8 (eight) hours as needed for nausea or vomiting.         . predniSONE (DELTASONE) 10 MG tablet   Oral  Take 8 mg by mouth every other day.          . Probiotic Product (PROBIOTIC DAILY PO)   Oral   Take by mouth.         . ranitidine (ZANTAC) 300 MG tablet   Oral   Take 300 mg by mouth at bedtime.         . sertraline (ZOLOFT) 50 MG tablet   Oral   Take 50 mg by mouth daily.         . valACYclovir (VALTREX) 500 MG tablet   Oral   Take 500 mg by mouth at bedtime.         . verapamil (CALAN-SR) 120 MG CR tablet   Oral   Take 1 tablet (120 mg total) by mouth at bedtime.   90 tablet   3   . vitamin E 400 UNIT capsule   Oral   Take 400 Units by mouth daily.         Marland Kitchen zolpidem (AMBIEN) 5 MG tablet   Oral   Take 5 mg by mouth daily.           Allergies Bactrim; Celebrex; Lasix; and Sulfa antibiotics  Family History  Problem Relation Age of Onset  . Arthritis Mother   . Hypertension Maternal Grandmother   . Hyperlipidemia Maternal Grandfather   . Heart disease Maternal Grandfather   . Stroke Maternal Grandfather   . Hypertension Maternal  Grandfather   . Colon cancer Paternal Grandfather   . Breast cancer Neg Hx     Social History Social History  Substance Use Topics  . Smoking status: Never Smoker   . Smokeless tobacco: Never Used  . Alcohol Use: 0.0 - 0.6 oz/week    0-1 Standard drinks or equivalent per week    Review of Systems  Constitutional: Positive for chills Eyes: Negative for redness ENT: Positive for sore throat Cardiovascular: Negative for chest pain Respiratory: Negative for shortness of breath. Negative for cough Gastrointestinal: Negative for abdominal pain. Positive for nausea and vomiting, negative for diarrhea Genitourinary: Negative for dysuria. Musculoskeletal: Mild low back pain and joint pain diffusely Skin: Negative for rash. Neurological: Negative for focal weakness Psychiatric: no anxiety    ____________________________________________   PHYSICAL EXAM:  VITAL SIGNS: ED Triage Vitals  Enc Vitals Group     BP 03/14/16 1216 123/58 mmHg     Pulse Rate 03/14/16 1216 146     Resp 03/14/16 1216 20     Temp 03/14/16 1216 99.6 F (37.6 C)     Temp Source 03/14/16 1216 Oral     SpO2 03/14/16 1216 96 %     Weight 03/14/16 1216 150 lb (68.04 kg)     Height 03/14/16 1216 5\' 4"  (1.626 m)     Head Cir --      Peak Flow --      Pain Score 03/14/16 1217 8     Pain Loc --      Pain Edu? --      Excl. in GC? --     Constitutional: Alert and oriented. Ill-appearing Eyes: Conjunctivae are normal. No erythema or injection ENT   Head: Normocephalic and atraumatic.   Mouth/Throat: Mucous membranes are moist. Cardiovascular: Significant tachycardia, 145, regular rhythm. Normal and symmetric distal pulses are present in the upper extremities. No murmurs or rubs  Respiratory: Normal respiratory effort without tachypnea nor retractions. Breath sounds are clear and equal bilaterally.  Gastrointestinal: Soft and non-tender in all quadrants.  No distention. There is no CVA  tenderness. Genitourinary: deferred Musculoskeletal: Nontender with normal range of motion in all extremities. No lower extremity tenderness nor edema. Neurologic:  Normal speech and language. No gross focal neurologic deficits are appreciated. Skin:  Skin is warm, dry and intact. No rash noted. Psychiatric: Patient exhibits appropriate insight and judgment.  ____________________________________________    LABS (pertinent positives/negatives)  Labs Reviewed  COMPREHENSIVE METABOLIC PANEL - Abnormal; Notable for the following:    Sodium 132 (*)    Glucose, Bld 102 (*)    BUN 23 (*)    Calcium 8.4 (*)    All other components within normal limits  CULTURE, BLOOD (ROUTINE X 2)  CULTURE, BLOOD (ROUTINE X 2)  URINE CULTURE  RAPID INFLUENZA A&B ANTIGENS ([facility] ONLY)  LACTIC ACID, PLASMA  CBC WITH DIFFERENTIAL/PLATELET  LACTIC ACID, PLASMA  URINALYSIS COMPLETEWITH MICROSCOPIC ([facility] ONLY)  MYCOPHENOLIC ACID (CELLCEPT)  POCT RAPID STREP A    ____________________________________________   EKG  ED ECG REPORT I, Jene Every, the attending physician, personally viewed and interpreted this ECG.   Date: 03/14/2016  EKG Time: 12:23 PM  Rate: 145  Rhythm: sinus tachycardia  Axis: Normal  Intervals:none  ST&T Change: Nonspecific   ____________________________________________    RADIOLOGY  Chest x-ray unremarkable  ____________________________________________   PROCEDURES  Procedure(s) performed: none  Critical Care performed: none  ____________________________________________   INITIAL IMPRESSION / ASSESSMENT AND PLAN / ED COURSE  Pertinent labs & imaging results that were available during my care of the patient were reviewed by me and considered in my medical decision making (see chart for details).  Patient presents with heart rate of 145, ill-appearing with reports of fever at home. Further, she is immunosuppressed because of her lupus. Code sepsis called, 2  IVs started, lactate sent, blood cultures drawn and 30 MLS per kilogram fluid started. Empiric antibiotics given as well  Differential diagnosis includes  sepsis or possibly influenza or gastroenteritis   ----------------------------------------- 2:45 PM on 03/14/2016 -----------------------------------------  Patient's heart rate has improved with fluids to mid 120s. Lab work is reassuring. This point I will admit the patient to the hospital for further workup  ____________________________________________   FINAL CLINICAL IMPRESSION(S) / ED DIAGNOSES  Final diagnoses:  Sepsis, due to unspecified organism [facility])          Jene Every, MD 03/14/16 1445

## 2016-03-14 NOTE — Progress Notes (Signed)
Pharmacy Antibiotic Note  Christina Meza is a 50 y.o. female admitted on 03/14/2016 with sepsis.  Pharmacy has been consulted for vancomycin & piperacillin/tazobactam dosing.  Plan: Zosyn 3.375g IV q8h (4 hour infusion).   Vancomycin 1000 mg one time dose given today ~1400. Will order vancomycin 750 mg IV q12h (stacked dose to begin 6 hours after initial dose at 2000 tonight).  Vancomycin trough goal 15-20 mcg/mL Vancomycin trough scheduled for 4/11 at 0730 which is prior to 5th dose and should represent steady state  Kinetics: Adj BW: 60 kg     CrCl 76 mL/min Ke: 0.067 Half-life: ~10.3 hrs    Vd: 42 L   Height: 5\' 4"  (162.6 cm) Weight: 150 lb (68.04 kg) IBW/kg (Calculated) : 54.7  Temp (24hrs), Avg:100.3 F (37.9 C), Min:99.3 F (37.4 C), Max:102.2 F (39 C)   Recent Labs Lab 03/14/16 1233  WBC 7.8  CREATININE 0.84  LATICACIDVEN 1.7    Estimated Creatinine Clearance: 76.7 mL/min (by C-G formula based on Cr of 0.84).    Allergies  Allergen Reactions  . Bactrim [Sulfamethoxazole-Trimethoprim] Itching  . Celebrex [Celecoxib] Other (See Comments)    Recommended not taking due to a stomach issue.   . Lasix [Furosemide] Itching  . Sulfa Antibiotics Itching   Thank you for allowing pharmacy to be a part of this patient's care.  Cindi Carbon, PharmD Clinical Pharmacist 03/14/2016 7:01 PM

## 2016-03-14 NOTE — Progress Notes (Signed)
Pt came to floor at 1700. VSS. Pt and husband were oriented to room and safety plan.

## 2016-03-15 LAB — CBC
HCT: 33.7 % — ABNORMAL LOW (ref 35.0–47.0)
Hemoglobin: 11.2 g/dL — ABNORMAL LOW (ref 12.0–16.0)
MCH: 31.3 pg (ref 26.0–34.0)
MCHC: 33.1 g/dL (ref 32.0–36.0)
MCV: 94.5 fL (ref 80.0–100.0)
PLATELETS: 205 10*3/uL (ref 150–440)
RBC: 3.56 MIL/uL — AB (ref 3.80–5.20)
RDW: 13.1 % (ref 11.5–14.5)
WBC: 6.3 10*3/uL (ref 3.6–11.0)

## 2016-03-15 LAB — BASIC METABOLIC PANEL
Anion gap: 4 — ABNORMAL LOW (ref 5–15)
BUN: 15 mg/dL (ref 6–20)
CHLORIDE: 112 mmol/L — AB (ref 101–111)
CO2: 22 mmol/L (ref 22–32)
CREATININE: 0.64 mg/dL (ref 0.44–1.00)
Calcium: 8 mg/dL — ABNORMAL LOW (ref 8.9–10.3)
GFR calc non Af Amer: >60 mL/min (ref >60)
GLUCOSE: 119 mg/dL — AB (ref 65–99)
Potassium: 3.8 mmol/L (ref 3.5–5.1)
Sodium: 138 mmol/L (ref 135–145)

## 2016-03-15 LAB — C-REACTIVE PROTEIN: CRP: 8.6 mg/dL — ABNORMAL HIGH (ref <1.0)

## 2016-03-15 MED ORDER — METRONIDAZOLE IN NACL 5-0.79 MG/ML-% IV SOLN
500.0000 mg | Freq: Three times a day (TID) | INTRAVENOUS | Status: DC
  Administered 2016-03-15 – 2016-03-16 (×2): 500 mg via INTRAVENOUS
  Filled 2016-03-15 (×6): qty 100

## 2016-03-15 MED ORDER — MYCOPHENOLATE MOFETIL 250 MG PO CAPS
500.0000 mg | ORAL_CAPSULE | Freq: Two times a day (BID) | ORAL | Status: DC
  Administered 2016-03-15: 500 mg via ORAL
  Filled 2016-03-15 (×2): qty 2

## 2016-03-15 MED ORDER — CIPROFLOXACIN IN D5W 400 MG/200ML IV SOLN
400.0000 mg | Freq: Two times a day (BID) | INTRAVENOUS | Status: DC
Start: 2016-03-15 — End: 2016-03-16
  Administered 2016-03-15 – 2016-03-16 (×2): 400 mg via INTRAVENOUS
  Filled 2016-03-15 (×4): qty 200

## 2016-03-15 MED ORDER — LOPERAMIDE HCL 2 MG PO CAPS
2.0000 mg | ORAL_CAPSULE | Freq: Once | ORAL | Status: AC
  Administered 2016-03-15: 2 mg via ORAL
  Filled 2016-03-15: qty 1

## 2016-03-15 MED ORDER — BUDESONIDE 0.25 MG/2ML IN SUSP: 0.2500 mg | Freq: Two times a day (BID) | RESPIRATORY_TRACT | Status: DC | PRN

## 2016-03-15 MED FILL — Mycophenolate Mofetil Cap 250 MG: ORAL | Qty: 2 | Status: AC

## 2016-03-15 NOTE — Consult Note (Signed)
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 Presented with fatigue, positive AMA, skin photosensitivity, malar rash. History of occasional Raynaud's. She has been on immunosuppression. Most recently Plaquenil, prednisone and CellCept. Recent evaluation showed a white count 3100. CellCept was lowered to 500 mg every other day. Some increased in joint stiffness and fatigue at the end of the day on lower dose She's not had any recent rash. There's been no recent Raynaud's or pleurisy. No ulcers. Yesterday she awakened in the night with nausea. Then developed vomiting. Since then to stop vomiting is now had diarrhea. Did have fever with some chills. Negative. She is on broad antibiotics. She had some tachycardia. Now able to keep down liquids. She was given stress steroid dose in the form of Solu-Medrol IV 40. Recent creatinine normal at 0.6. White count 6000. Negative urinalysis. Cultures negative. Sedimentation rate 54.  PMH: Lupus. Tachycardia. Asthma. Depression. Reflux.  SURGICAL HISTORY: Cataracts. Hernia repair  Family History: Negative for connective tissue disease  Social History: No regular cigarettes or alcohol  Allergies:  Allergies  Allergen Reactions  . Bactrim [Sulfamethoxazole-Trimethoprim] Itching  . Celebrex [Celecoxib] Other (See Comments)    Recommended not taking due to a stomach issue.   . Lasix [Furosemide] Itching  . Sulfa Antibiotics Itching    Medications:  Scheduled: . cholecalciferol  2,000 Units Oral Daily  . enoxaparin (LOVENOX) injection  40 mg Subcutaneous Q24H  . famotidine  40 mg Oral QHS  . hydroxychloroquine  200 mg Oral QODAY  . hydroxychloroquine  400 mg Oral QODAY  . loratadine  10 mg Oral Daily  . methylPREDNISolone (SOLU-MEDROL) injection  40 mg Intravenous Daily  . multivitamin with minerals  1 tablet Oral Daily  .  mycophenolate  500 mg Oral BID  . piperacillin-tazobactam (ZOSYN)  IV  3.375 g Intravenous 3 times per day  . sertraline  50 mg Oral Daily  . valACYclovir  500 mg Oral QHS  . vancomycin  750 mg Intravenous Q12H  . verapamil  120 mg Oral QHS  . vitamin E  400 Units Oral Daily        ROS: No chest pain. No abdominal pain. No photosensitivity. No facial rash. No recent hair thinning.   PHYSICAL EXAM: Blood pressure 104/63, pulse 95, temperature 98.1 F (36.7 C), temperature source Oral, resp. rate 26, height 5\' 4"  (1.626 m), weight 68.04 kg (150 lb), last menstrual period 07/15/2015, SpO2 99 %. Pleasant female. No acute distress. Sclera clear. Face without any discoid lesions. Oropharynx clear. No thyromegaly. Clear chest. Regular rhythm. No murmur. Minimally tender abdomen with good bowel sounds. No significant edema. No hand or knee synovitis  Assessment: Recent gastroenteritis No evidence of active lupus flare  Recommendations: Agree with's stress steroids until she is able to eat and take oral meds Hold CellCept during this infection. May resume at prior dose after discharge  Christina Meza 03/15/2016, 1:41 PM

## 2016-03-15 NOTE — Progress Notes (Signed)
[facility] [place] at [facility]   PATIENT NAME: Christina Meza    MR#:  [id number]  DATE OF BIRTH:  June 12, 1966  SUBJECTIVE:  CHIEF COMPLAINT:  Patient is having diarrhea. Denies any blood in stool.. Decreased appetite. Has nausea but no vomiting ,reporting lower abdominal cramps  REVIEW OF SYSTEMS:  CONSTITUTIONAL: No fever, fatigue or weakness.  EYES: No blurred or double vision.  EARS, NOSE, AND THROAT: No tinnitus or ear pain.  RESPIRATORY: No cough, shortness of breath, wheezing or hemoptysis.  CARDIOVASCULAR: No chest pain, orthopnea, edema.  GASTROINTESTINAL: Has nausea, diarrhea and lower abdominal cramps. Denies any abdominal pain and vomiting improved GENITOURINARY: No dysuria, hematuria.  ENDOCRINE: No polyuria, nocturia,  HEMATOLOGY: No anemia, easy bruising or bleeding SKIN: No rash or lesion. MUSCULOSKELETAL: No joint pain or arthritis.   NEUROLOGIC: No tingling, numbness, weakness.  PSYCHIATRY: No anxiety or depression.   DRUG ALLERGIES:   Allergies  Allergen Reactions  . Bactrim [Sulfamethoxazole-Trimethoprim] Itching  . Celebrex [Celecoxib] Other (See Comments)    Recommended not taking due to a stomach issue.   . Lasix [Furosemide] Itching  . Sulfa Antibiotics Itching    VITALS:  Blood pressure 115/64, pulse 103, temperature 98.6 F (37 C), temperature source Oral, resp. rate 17, height  (1.626 m), weight 68.04 kg (150 lb), last menstrual period 07/15/2015, SpO2 100 %.  PHYSICAL EXAMINATION:  GENERAL:  50 y.o.-year-old patient lying in the bed with no acute distress.  EYES: Pupils equal, round, reactive to light and accommodation. No scleral icterus. Extraocular muscles intact.  HEENT: Head atraumatic, normocephalic. Oropharynx and nasopharynx clear.  NECK:  Supple, no jugular venous distention. No thyroid enlargement, no tenderness.  LUNGS: Normal breath sounds bilaterally, no wheezing, rales,rhonchi or crepitation.  No use of accessory muscles of respiration.  CARDIOVASCULAR: S1, S2 normal. No murmurs, rubs, or gallops.  ABDOMEN: Soft, nontender, nondistended. Bowel sounds present. No organomegaly or mass.  EXTREMITIES: No pedal edema, cyanosis, or clubbing.  NEUROLOGIC: Cranial nerves II through XII are intact. Muscle strength 5/5 in all extremities. Sensation intact. Gait not checked.  PSYCHIATRIC: The patient is alert and oriented x 3.  SKIN: No obvious rash, lesion, or ulcer.    LABORATORY PANEL:   CBC  Recent Labs Lab 03/15/16 0631  WBC 6.3  HGB 11.2*  HCT 33.7*  PLT 205   ------------------------------------------------------------------------------------------------------------------  Chemistries   Recent Labs Lab 03/14/16 1233 03/15/16 0631  NA 132* 138  K 3.8 3.8  CL 103 112*  CO2 23 22  GLUCOSE 102* 119*  BUN 23* 15  CREATININE 0.84 0.64  CALCIUM 8.4* 8.0*  AST 33  --   ALT 17  --   ALKPHOS 75  --   BILITOT 0.6  --    ------------------------------------------------------------------------------------------------------------------  Cardiac Enzymes No results for input(s): TROPONINI in the last 168 hours. ------------------------------------------------------------------------------------------------------------------  RADIOLOGY:  Dg Chest Port 1 View  03/14/2016  CLINICAL DATA:  50 year old with current history of lupus, presenting with nausea and vomiting, tachycardia and hypotension. EXAM: PORTABLE CHEST 1 VIEW COMPARISON:  10/22/2015. FINDINGS: Suboptimal inspiration accounts for crowded bronchovascular markings diffusely and atelectasis in the bases, and accentuates the cardiac silhouette. Taking this into account, cardiac silhouette likely upper normal in size. Lungs otherwise clear. No localized airspace consolidation. No pleural effusions. No pneumothorax. Normal pulmonary vascularity. IMPRESSION: Suboptimal inspiration accounts for bibasilar atelectasis. No  acute cardiopulmonary disease otherwise. Electronically Signed   By: Hulan Saas M.D.   On: 03/14/2016 12:51  EKG:   Orders placed or performed during the hospital encounter of 03/14/16  . EKG 12-Lead  . EKG 12-Lead    ASSESSMENT AND PLAN:   50 year old female with past medical history of lupus, GERD, osteoarthritis, history of C. difficile colitis, depression, asthma who presents to the hospital due to fever, malaise, body aches and noted to be septic.  1. Sepsis-patient needs criteria given her fever, tachycardia. The source is probably acute gastroenteritis he is he -Her chest x-ray is negative for any acute abnormalities, urinalysis is negative. She does have nonspecific symptoms consistent with flu but her antigens are negative. Negative flu for PCR. -Continue supportive care with IV fluids, antipyretics -Blood and urine cultures are negative to date -Empirically treated with vancomycin and Zosyn. Changing antibiotics to IV Cipro and Flagyl  Follow fever curve. On stress dose steroids-  2-acute gastroenteritis probably viral etiology- Will get stool culture and sensitivity given the past history of C. difficile colitis will check stool for C. Difficile -Supportive care with IV fluids, antiemetics.  Place on clear liquid diet. If continues to have symptoms we'll get CT scan of abdomen pelvis. Patient is started on Cipro and Flagyl  3. History of systemic lupus Hold CellCept, May resume at prior dose after discharge, continue  Plaquenil  -I will continue  stress dose steroids given her sepsis. -Appreciate rheumatology recommendations  4. GERD-continue Pepcid.  5. Essential hypertension-continue verapamil.  6. Asthma-no acute exacerbation. Continue Asmanex.    All the records are reviewed and case discussed with Care Management/Social Workerr. Management plans discussed with the patient, husband and they are in agreement.  CODE STATUS: fc   TOTAL TIME TAKING  CARE OF THIS PATIENT: 35  minutes.   POSSIBLE D/C IN 2-3 DAYS, DEPENDING ON CLINICAL CONDITION.   Ramonita Lab M.D on 03/15/2016 at 3:35 PM  Between 7am to 6pm - Pager - [phone] After 6pm go to [email or web address] - password [email or web address] [facility]  [facility]  Office  [phone]  CC: Primary care physician; Tommie Sams, DO

## 2016-03-16 LAB — BASIC METABOLIC PANEL
ANION GAP: 4 — AB (ref 5–15)
BUN: 13 mg/dL (ref 6–20)
CALCIUM: 8 mg/dL — AB (ref 8.9–10.3)
CHLORIDE: 112 mmol/L — AB (ref 101–111)
CO2: 20 mmol/L — AB (ref 22–32)
CREATININE: 0.66 mg/dL (ref 0.44–1.00)
GFR calc non Af Amer: >60 mL/min (ref >60)
GLUCOSE: 111 mg/dL — AB (ref 65–99)
Potassium: 3.6 mmol/L (ref 3.5–5.1)
Sodium: 136 mmol/L (ref 135–145)

## 2016-03-16 LAB — CBC
HCT: 31.7 % — ABNORMAL LOW (ref 35.0–47.0)
HEMOGLOBIN: 10.7 g/dL — AB (ref 12.0–16.0)
MCH: 31.8 pg (ref 26.0–34.0)
MCHC: 33.7 g/dL (ref 32.0–36.0)
MCV: 94.2 fL (ref 80.0–100.0)
PLATELETS: 186 10*3/uL (ref 150–440)
RBC: 3.36 MIL/uL — AB (ref 3.80–5.20)
RDW: 13.6 % (ref 11.5–14.5)
WBC: 8.2 10*3/uL (ref 3.6–11.0)

## 2016-03-16 LAB — OCCULT BLOOD X 1 CARD TO LAB, STOOL: FECAL OCCULT BLD: NEGATIVE

## 2016-03-16 LAB — MYCOPHENOLIC ACID (CELLCEPT)
MPA Glucuronide: 12 ug/mL — ABNORMAL LOW (ref 15–125)
MPA: 1.6 ug/mL (ref 1.0–3.5)

## 2016-03-16 LAB — URINE CULTURE: CULTURE: NO GROWTH

## 2016-03-16 MED ORDER — METOCLOPRAMIDE HCL 5 MG/5ML PO SOLN
5.0000 mg | Freq: Four times a day (QID) | ORAL | Status: DC | PRN
  Filled 2016-03-16: qty 5

## 2016-03-16 MED ORDER — METOCLOPRAMIDE HCL 5 MG/5ML PO SOLN
5.0000 mg | Freq: Four times a day (QID) | ORAL | Status: DC | PRN
Start: 2016-03-16 — End: 2016-03-16
  Administered 2016-03-16: 5 mg via ORAL
  Filled 2016-03-16 (×2): qty 10

## 2016-03-16 MED ORDER — METRONIDAZOLE 500 MG PO TABS: 500.0000 mg | ORAL_TABLET | Freq: Three times a day (TID) | ORAL | Status: DC

## 2016-03-16 MED ORDER — LEVOFLOXACIN 500 MG PO TABS: 500.0000 mg | ORAL_TABLET | Freq: Every day | ORAL | Status: DC

## 2016-03-16 NOTE — Progress Notes (Signed)
Patient discharged home with family.  All discharge instructions reviewed and discharge paperwork given to patient.  Patient verbalized understanding.  IV removed in tact. Prescriptions given to patient with all questions and concerns addressed. Patient's parents at bedside for transfer home.

## 2016-03-22 LAB — CULTURE, BLOOD (ROUTINE X 2)
Culture: NO GROWTH
Culture: NO GROWTH

## 2016-03-24 NOTE — Discharge Summary (Signed)
[place] [facility] [place] NAME: Christina Meza    MR#:  [id number]  DATE OF BIRTH:  May 15, 1966  DATE OF ADMISSION:  03/14/2016 ADMITTING PHYSICIAN: Henreitta Leber, MD  DATE OF DISCHARGE: 03/16/2016  4:39 PM  PRIMARY CARE PHYSICIAN: Coral Spikes, DO    ADMISSION DIAGNOSIS:  Sepsis, due to unspecified organism ([place]) [A41.9]  DISCHARGE DIAGNOSIS:  Active Problems:   Sepsis ([place])   SECONDARY DIAGNOSIS:   Past Medical History  Diagnosis Date  . Asthma   . Depression   . Arthritis   . GERD (gastroesophageal reflux disease)   . SLE (systemic lupus erythematosus) ([place])   . C. difficile diarrhea 0539,7673  . Palpitations   . Inappropriate sinus tachycardia [facility])     HOSPITAL COURSE:   50 year old female with past medical history of lupus, GERD, osteoarthritis, history of C. difficile colitis, depression, asthma who presents to the hospital due to fever, malaise, body aches and noted to be septic.  1. Sepsis-patient met criteria given her fever, tachycardia. The source is probably acute gastroenteritis  -Her chest x-ray is negative for any acute abnormalities, urinalysis is negative. She does have nonspecific symptoms consistent with flu but her antigens are negative. Negative flu for PCR. -Continue supportive care with IV fluids, antipyretics -Blood and urine cultures are negative to date -Empirically treated with vancomycin and Zosyn. Changed  antibiotics to  Cipro and Flagyl Given stress dose steroids during the hospital course  2-acute gastroenteritis probably viral etiology- Neg stool for C. difficile colitis -Supportive care with IV fluids, antiemetics provided during the hospital course and clinically patient's situation improved. Place on clear liquid diet and diet advanced as tolerated Patient was given ciprofloxacin and Flagyl   3. History of systemic lupus Held CellCept during hospital course and resumed at   discharge, continue Plaquenil  - stress dose steroids given her sepsis. -Appreciate rheumatology recommendations  4. GERD-continue Pepcid.  5. Essential hypertension-continue verapamil.  6. Asthma-no acute exacerbation. Continue Asmanex.   DISCHARGE CONDITIONS:   fair  CONSULTS OBTAINED:  Treatment Team:  Emmaline Kluver., MD   PROCEDURES none   DRUG ALLERGIES:   Allergies  Allergen Reactions  . Bactrim [Sulfamethoxazole-Trimethoprim] Itching  . Celebrex [Celecoxib] Other (See Comments)    Recommended not taking due to a stomach issue.   . Lasix [Furosemide] Itching  . Sulfa Antibiotics Itching    DISCHARGE MEDICATIONS:   Discharge Medication List as of 03/16/2016  3:03 PM    START taking these medications   Details  levofloxacin (LEVAQUIN) 500 MG tablet Take 1 tablet (500 mg total) by mouth daily., Starting 03/16/2016, Until Discontinued, Print    metroNIDAZOLE (FLAGYL) 500 MG tablet Take 1 tablet (500 mg total) by mouth 3 (three) times daily., Starting 03/16/2016, Until Discontinued, Print      CONTINUE these medications which have NOT CHANGED   Details  Biotin 5000 MCG TABS Take by mouth., Until Discontinued, Historical Med    cetirizine (ZYRTEC) 10 MG tablet Take 10 mg by mouth at bedtime., Until Discontinued, Historical Med    Cholecalciferol (VITAMIN D) 2000 UNITS tablet Take 2,000 Units by mouth daily., Until Discontinued, Historical Med    diclofenac sodium (VOLTAREN) 1 % GEL Apply 1 application topically as needed., Until Discontinued, Historical Med    gabapentin (NEURONTIN) 300 MG capsule Take 600 mg by mouth at bedtime., Until Discontinued, Historical Med    !! hydroxychloroquine (PLAQUENIL) 200 MG tablet Take 200 mg  by mouth every other day., Until Discontinued, Historical Med    metronidazole (NORITATE) 1 % cream Apply topically daily., Until Discontinued, Historical Med    mometasone (ASMANEX) 220 MCG/INH inhaler Inhale 2 puffs into the  lungs daily., Until Discontinued, Historical Med    Multiple Vitamins-Minerals (MULTIVITAMIN WITH MINERALS) tablet Take 1 tablet by mouth every morning. , Until Discontinued, Historical Med    mycophenolate (CELLCEPT) 500 MG tablet Take 500 mg by mouth every other day. , Until Discontinued, Historical Med    ondansetron (ZOFRAN) 4 MG tablet Take 4 mg by mouth every 8 (eight) hours as needed for nausea or vomiting., Until Discontinued, Historical Med    !! predniSONE (DELTASONE) 1 MG tablet Take 6 mg by mouth every other day., Until Discontinued, Historical Med    !! predniSONE (DELTASONE) 5 MG tablet Take 5 mg by mouth every other day., Until Discontinued, Historical Med    Probiotic Product (PROBIOTIC DAILY PO) Take 1 tablet by mouth daily. , Until Discontinued, Historical Med    ranitidine (ZANTAC) 300 MG tablet Take 300 mg by mouth at bedtime., Until Discontinued, Historical Med    sertraline (ZOLOFT) 50 MG tablet Take 50 mg by mouth daily., Until Discontinued, Historical Med    valACYclovir (VALTREX) 500 MG tablet Take 500 mg by mouth at bedtime., Until Discontinued, Historical Med    verapamil (CALAN-SR) 120 MG CR tablet Take 1 tablet (120 mg total) by mouth at bedtime., Starting 11/17/2015, Until Discontinued, Normal    vitamin E 400 UNIT capsule Take 400 Units by mouth daily., Until Discontinued, Historical Med    zolpidem (AMBIEN) 5 MG tablet Take 5 mg by mouth at bedtime as needed for sleep. , Until Discontinued, Historical Med    acetaminophen (TYLENOL) 650 MG suppository Place 650 mg rectally every 4 (four) hours as needed., Until Discontinued, Historical Med    !! hydroxychloroquine (PLAQUENIL) 200 MG tablet Take 400 mg by mouth every other day., Until Discontinued, Historical Med    levalbuterol (XOPENEX HFA) 45 MCG/ACT inhaler Inhale 1 puff into the lungs every 4 (four) hours as needed for wheezing. Reported on 03/14/2016, Until Discontinued, Historical Med     !! -  Potential duplicate medications found. Please discuss with provider.       DISCHARGE INSTRUCTIONS:  Activity as tolerated Follow-up with primary care physician in a week Follow-up with   Rheumatologist in a week Low-salt diet    DIET:   Low-salt diet DISCHARGE CONDITION:  Fair  ACTIVITY:  Activity as tolerated  OXYGEN:  Home Oxygen: No.   Oxygen Delivery: room air  DISCHARGE LOCATION:  home   If you experience worsening of your admission symptoms, develop shortness of breath, life threatening emergency, suicidal or homicidal thoughts you must seek medical attention immediately by calling 911 or calling your MD immediately  if symptoms less severe.  You Must read complete instructions/literature along with all the possible adverse reactions/side effects for all the Medicines you take and that have been prescribed to you. Take any new Medicines after you have completely understood and accpet all the possible adverse reactions/side effects.   Please note  You were cared for by a hospitalist during your hospital stay. If you have any questions about your discharge medications or the care you received while you were in the hospital after you are discharged, you can call the unit and asked to speak with the hospitalist on call if the hospitalist that took care of you is not available. Once you  are discharged, your primary care physician will handle any further medical issues. Please note that NO REFILLS for any discharge medications will be authorized once you are discharged, as it is imperative that you return to your primary care physician (or establish a relationship with a primary care physician if you do not have one) for your aftercare needs so that they can reassess your need for medications and monitor your lab values.     Today  Chief Complaint  Patient presents with  . Tachycardia  . Emesis   Patient's diarrhea significantly improved. Nausea and vomiting resolved and  tolerating diet which was advanced gradually. Feels better today and wants to go home    ROS:  CONSTITUTIONAL: Denies fevers, chills. Denies any fatigue, weakness.  EYES: Denies blurry vision, double vision, eye pain. EARS, NOSE, THROAT: Denies tinnitus, ear pain, hearing loss. RESPIRATORY: Denies cough, wheeze, shortness of breath.  CARDIOVASCULAR: Denies chest pain, palpitations, edema.  GASTROINTESTINAL: Denies nausea, vomiting, diarrhea, abdominal pain. Denies bright red blood per rectum. GENITOURINARY: Denies dysuria, hematuria. ENDOCRINE: Denies nocturia or thyroid problems. HEMATOLOGIC AND LYMPHATIC: Denies easy bruising or bleeding. SKIN: Denies rash or lesion. MUSCULOSKELETAL: Denies pain in neck, back, shoulder, knees, hips or arthritic symptoms.  NEUROLOGIC: Denies paralysis, paresthesias.  PSYCHIATRIC: Denies anxiety or depressive symptoms.   VITAL SIGNS:  Blood pressure 121/71, pulse 95, temperature 98.2 F (36.8 C), temperature source Oral, resp. rate 16, height '5\' 4"'  (1.626 m), weight 68.04 kg (150 lb), last menstrual period 07/15/2015, SpO2 100 %.  I/O:  No intake or output data in the 24 hours ending 03/24/16 1807  PHYSICAL EXAMINATION:  GENERAL:  50 y.o.-year-old patient lying in the bed with no acute distress.  EYES: Pupils equal, round, reactive to light and accommodation. No scleral icterus. Extraocular muscles intact.  HEENT: Head atraumatic, normocephalic. Oropharynx and nasopharynx clear.  NECK:  Supple, no jugular venous distention. No thyroid enlargement, no tenderness.  LUNGS: Normal breath sounds bilaterally, no wheezing, rales,rhonchi or crepitation. No use of accessory muscles of respiration.  CARDIOVASCULAR: S1, S2 normal. No murmurs, rubs, or gallops.  ABDOMEN: Soft, non-tender, non-distended. Bowel sounds present. No organomegaly or mass.  EXTREMITIES: No pedal edema, cyanosis, or clubbing.  NEUROLOGIC: Cranial nerves II through XII are intact.  Muscle strength 5/5 in all extremities. Sensation intact. Gait not checked.  PSYCHIATRIC: The patient is alert and oriented x 3.  SKIN: No obvious rash, lesion, or ulcer.   DATA REVIEW:   CBC No results for input(s): WBC, HGB, HCT, PLT in the last 168 hours.  Chemistries  No results for input(s): NA, K, CL, CO2, GLUCOSE, BUN, CREATININE, CALCIUM, MG, AST, ALT, ALKPHOS, BILITOT in the last 168 hours.  Invalid input(s): GFRCGP  Cardiac Enzymes No results for input(s): TROPONINI in the last 168 hours.  Microbiology Results  Results for orders placed or performed during the hospital encounter of 03/14/16  Blood Culture (routine x 2)     Status: None   Collection Time: 03/14/16 12:33 PM  Result Value Ref Range Status   Specimen Description BLOOD RIGHT ARM  Final   Special Requests BOTTLES DRAWN AEROBIC AND ANAEROBIC [facility]  Final   Culture NO GROWTH 8 DAYS  Final   Report Status 03/22/2016 FINAL  Final  Rapid Influenza A&B Antigens ([employer] only)     Status: None   Collection Time: 03/14/16 12:33 PM  Result Value Ref Range Status   Influenza A ([facility]) NEGATIVE NEGATIVE Final   Influenza B ([facility]) NEGATIVE NEGATIVE  Final  Blood Culture (routine x 2)     Status: None   Collection Time: 03/14/16 12:38 PM  Result Value Ref Range Status   Specimen Description BLOOD LEFT WRIST  Final   Special Requests BOTTLES DRAWN AEROBIC AND ANAEROBIC [place]  Final   Culture NO GROWTH 8 DAYS  Final   Report Status 03/22/2016 FINAL  Final  Urine culture     Status: None   Collection Time: 03/14/16  2:15 PM  Result Value Ref Range Status   Specimen Description URINE, RANDOM  Final   Special Requests NONE  Final   Culture NO GROWTH 2 DAYS  Final   Report Status 03/16/2016 FINAL  Final  C difficile quick scan w PCR reflex     Status: None   Collection Time: 03/14/16  7:01 PM  Result Value Ref Range Status   C Diff antigen NEGATIVE NEGATIVE Final   C Diff toxin NEGATIVE NEGATIVE Final   C Diff  interpretation Negative for C. difficile  Final    RADIOLOGY:  No results found.  EKG:   Orders placed or performed during the hospital encounter of 03/14/16  . EKG 12-Lead  . EKG 12-Lead  . EKG      Management plans discussed with the patient, family and they are in agreement.  CODE STATUS:  Code Status History    Date Active Date Inactive Code Status Order ID Comments User Context   03/14/2016  5:10 PM 03/16/2016  7:40 PM Full Code [id number]  Henreitta Leber, MD Inpatient    Advance Directive Documentation        Most Recent Value   Type of Advance Directive  Living will, Healthcare Power of Attorney   Pre-existing out of facility DNR order (yellow form or pink MOST form)     "MOST" Form in Place?        TOTAL TIME TAKING CARE OF THIS PATIENT: 45 minutes.    '@[facility]' @  on 03/24/2016 at 6:07 PM  Between 7am to 6pm - Pager - [phone]  After 6pm go to [email or web address] - password [email or web address] [place] [facility]  Office  [phone]  CC: Primary care physician; Coral Spikes, DO

## 2016-05-21 ENCOUNTER — Encounter: Payer: Self-pay | Admitting: Family Medicine

## 2016-05-21 ENCOUNTER — Ambulatory Visit (INDEPENDENT_AMBULATORY_CARE_PROVIDER_SITE_OTHER): Payer: BLUE CROSS/BLUE SHIELD | Admitting: Family Medicine

## 2016-05-21 VITALS — BP 102/68 | HR 97 | Temp 98.2°F | Ht 65.0 in | Wt 148.5 lb

## 2016-05-21 DIAGNOSIS — K219 Gastro-esophageal reflux disease without esophagitis: Secondary | ICD-10-CM

## 2016-05-21 DIAGNOSIS — M25531 Pain in right wrist: Secondary | ICD-10-CM | POA: Diagnosis not present

## 2016-05-21 DIAGNOSIS — M25532 Pain in left wrist: Secondary | ICD-10-CM

## 2016-05-21 MED ORDER — ONDANSETRON HCL 4 MG PO TABS: 4.0000 mg | ORAL_TABLET | Freq: Three times a day (TID) | ORAL | Status: DC | PRN

## 2016-05-21 MED ORDER — SERTRALINE HCL 50 MG PO TABS
50.0000 mg | ORAL_TABLET | Freq: Every day | ORAL | Status: DC
Start: 2016-05-21 — End: 2016-08-10

## 2016-05-21 MED ORDER — ZOLPIDEM TARTRATE 5 MG PO TABS: 5.0000 mg | ORAL_TABLET | Freq: Every evening | ORAL | Status: DC | PRN

## 2016-05-21 MED ORDER — PANTOPRAZOLE SODIUM 40 MG PO TBEC: 40.0000 mg | DELAYED_RELEASE_TABLET | Freq: Every day | ORAL | Status: DC

## 2016-05-21 NOTE — Progress Notes (Signed)
Pre visit review using our clinic review tool, if applicable. No additional management support is needed unless otherwise documented below in the visit note. 

## 2016-05-21 NOTE — Patient Instructions (Signed)
Stop the Zantac.  Start the protonix.  Go ahead and increase the prednisone.  Follow up in 6 months.  Take care  Dr. Adriana Simas

## 2016-05-24 ENCOUNTER — Telehealth: Payer: Self-pay

## 2016-05-24 DIAGNOSIS — M25531 Pain in right wrist: Secondary | ICD-10-CM | POA: Insufficient documentation

## 2016-05-24 DIAGNOSIS — M25532 Pain in left wrist: Principal | ICD-10-CM

## 2016-05-24 NOTE — Assessment & Plan Note (Signed)
New problem. Suspect secondary to SLE. Advised to go ahead and increase prednisone to 6 mg daily and discuss with rheumatology.

## 2016-05-24 NOTE — Progress Notes (Signed)
Subjective:  Patient ID: Ginger Carne, female    DOB: 04/18/1966  Age: 50 y.o. MRN: [id number]  CC: Wrist pain, Follow/med refill  HPI:  50 year old female with a history of SLE presents for follow up.  She has a couple of concerns today.  Wrist pain  Patient reports a 3 week history of bilateral wrist pain.  Pain is moderate in severity.  Pain is located on the radial side (dorsum).  She reports associated weakness with hand grip.   No recent fall, trauma, injury.  No known exacerbating or relieving factors.  Of note, patient has been slowly tapering down on steroids and recently went down from 6 mg to 5 mg.   GERD  Patient reports that her GERD is controlled on Zantac.  She states she frequently has symptoms.  She would like to discuss treatment options today.  Social Hx   Social History   Social History  . Marital Status: Married    Spouse Name: N/A  . Number of Children: N/A  . Years of Education: N/A   Social History Main Topics  . Smoking status: Never Smoker   . Smokeless tobacco: Never Used  . Alcohol Use: 0.0 - 0.6 oz/week    0-1 Standard drinks or equivalent per week  . Drug Use: No  . Sexual Activity:    Partners: Male   Other Topics Concern  . None   Social History Tourist information centre manager at [employer]      Review of Systems  Gastrointestinal:       GERD.  Musculoskeletal:       Wrist pain, bilaterally.    Objective:  BP 102/68 mmHg  Pulse 97  Temp(Src) 98.2 F (36.8 C) (Oral)  Ht 5\' 5"  (1.651 m)  Wt 148 lb 8 oz (67.359 kg)  BMI 24.71 kg/m2  SpO2 98%  LMP 07/15/2015 (Within Days)  BP/Weight 05/21/2016 03/16/2016 03/14/2016  Systolic BP 102 121 -  Diastolic BP 68 71 -  Wt. (Lbs) 148.5 - 150  BMI 24.71 - 25.73   Physical Exam  Constitutional: She is oriented to person, place, and time. She appears well-developed. No distress.  Pulmonary/Chest: Effort normal.  Musculoskeletal:  Wrist (bilateral) - tenderness to palpation of the  radial aspect of the wrists bilaterally. Normal range of motion.  Neurological: She is alert and oriented to person, place, and time.  Psychiatric:  Flat affect.   Vitals reviewed.   Lab Results  Component Value Date   WBC 8.2 03/16/2016   HGB 10.7* 03/16/2016   HCT 31.7* 03/16/2016   PLT 186 03/16/2016   GLUCOSE 111* 03/16/2016   CHOL 186 11/11/2015   TRIG 217.0* 11/11/2015   HDL 42.80 11/11/2015   LDLDIRECT 110.0 11/11/2015   ALT 17 03/14/2016   AST 33 03/14/2016   NA 136 03/16/2016   K 3.6 03/16/2016   CL 112* 03/16/2016   CREATININE 0.66 03/16/2016   BUN 13 03/16/2016   CO2 20* 03/16/2016   TSH 1.55 11/11/2015   Assessment & Plan:   Problem List Items Addressed This Visit    Bilateral wrist pain - Primary    New problem. Suspect secondary to SLE. Advised to go ahead and increase prednisone to 6 mg daily and discuss with rheumatology.      GERD (gastroesophageal reflux disease)    Established problem, uncontrolled. Stopping Zantac. Starting Protonix.      Relevant Medications   ondansetron (ZOFRAN) 4 MG tablet   pantoprazole (PROTONIX) 40 MG tablet  Meds ordered this encounter  Medications  . ondansetron (ZOFRAN) 4 MG tablet    Sig: Take 1 tablet (4 mg total) by mouth every 8 (eight) hours as needed for nausea or vomiting.    Dispense:  30 tablet    Refill:  3  . zolpidem (AMBIEN) 5 MG tablet    Sig: Take 1 tablet (5 mg total) by mouth at bedtime as needed for sleep.    Dispense:  30 tablet    Refill:  3  . sertraline (ZOLOFT) 50 MG tablet    Sig: Take 1 tablet (50 mg total) by mouth daily.    Dispense:  90 tablet    Refill:  3  . pantoprazole (PROTONIX) 40 MG tablet    Sig: Take 1 tablet (40 mg total) by mouth daily.    Dispense:  90 tablet    Refill:  3   Follow-up: 6 months.  Everlene Other DO [facility]

## 2016-05-24 NOTE — Assessment & Plan Note (Signed)
Established problem, uncontrolled. Stopping Zantac. Starting Protonix.

## 2016-05-24 NOTE — Telephone Encounter (Signed)
PA for Ambien faxed to pharmacy.

## 2016-05-25 NOTE — Telephone Encounter (Signed)
PA approved from 05/24/2016-05/25/2019. thanks

## 2016-06-14 ENCOUNTER — Telehealth: Payer: Self-pay | Admitting: *Deleted

## 2016-06-14 NOTE — Telephone Encounter (Signed)
See below , thanks

## 2016-06-14 NOTE — Telephone Encounter (Signed)
Thanks, got her scheduled

## 2016-06-14 NOTE — Telephone Encounter (Signed)
Please advise if this is ok, 430appt time.  thanks

## 2016-06-14 NOTE — Telephone Encounter (Signed)
That is fine 

## 2016-06-14 NOTE — Telephone Encounter (Signed)
Patient would like to be seen by Dr. Adriana Simas in 06-17-16 at 4:30 for left neck pain, tingling in fingers and weakness. Please advise if this will be ok. Pt contact [phone]

## 2016-06-17 ENCOUNTER — Encounter: Payer: Self-pay | Admitting: Family Medicine

## 2016-06-17 ENCOUNTER — Ambulatory Visit (INDEPENDENT_AMBULATORY_CARE_PROVIDER_SITE_OTHER): Payer: BLUE CROSS/BLUE SHIELD | Admitting: Family Medicine

## 2016-06-17 ENCOUNTER — Ambulatory Visit (INDEPENDENT_AMBULATORY_CARE_PROVIDER_SITE_OTHER): Payer: BLUE CROSS/BLUE SHIELD

## 2016-06-17 VITALS — BP 126/74 | HR 87 | Temp 98.0°F | Wt 147.2 lb

## 2016-06-17 DIAGNOSIS — M542 Cervicalgia: Secondary | ICD-10-CM | POA: Diagnosis not present

## 2016-06-17 NOTE — Patient Instructions (Signed)
We will call with the results.  Take care  Dr. Agustina Witzke  

## 2016-06-17 NOTE — Progress Notes (Signed)
Pre visit review using our clinic review tool, if applicable. No additional management support is needed unless otherwise documented below in the visit note. 

## 2016-06-18 ENCOUNTER — Encounter: Payer: Self-pay | Admitting: Family Medicine

## 2016-06-18 DIAGNOSIS — M542 Cervicalgia: Secondary | ICD-10-CM | POA: Insufficient documentation

## 2016-06-18 NOTE — Assessment & Plan Note (Signed)
New problem. Suspect that patient's pain is coming from cervical disc disease vs. Muscular.  Shoulder exam normal. Xray of the neck today. Advised short increase in Prednisone if needed. Stop NSAID's.

## 2016-06-18 NOTE — Progress Notes (Signed)
Subjective:  Patient ID: Christina Meza, female    DOB: [birth year]  Age: 50 y.o. MRN: [id number]  CC: Shoulder/neck pain  HPI:  50 year old female with a past medical history of asthma and SLE presents with the above complaint.  Patient states that approximately 3 weeks ago she developed right scapular pain. This subsequently resolved but then she developed left shoulder pain and neck pain. This worsened over the weekend. She has been taking her regular dose of prednisone as well as NSAIDs for pain. She's had little improvement. She reports associated left arm/hand numbness and tingling. No known exacerbating factors. No recent fall, trauma, injury. Symptoms are moderate in severity. No other complaints at this time.  Social Hx   Social History   Social History  . Marital Status: Married    Spouse Name: N/A  . Number of Children: N/A  . Years of Education: N/A   Social History Main Topics  . Smoking status: Never Smoker   . Smokeless tobacco: Never Used  . Alcohol Use: 0.0 - 0.6 oz/week    0-1 Standard drinks or equivalent per week  . Drug Use: No  . Sexual Activity:    Partners: Male   Other Topics Concern  . None   Social History Tourist information centre manager at [employer].      Review of Systems  Constitutional: Negative.   Musculoskeletal: Positive for neck pain.       Shoulder pain.   Objective:  BP 126/74 mmHg  Pulse 87  Temp(Src) 98 F (36.7 C) (Oral)  Wt 147 lb 4 oz (66.792 kg)  SpO2 98%  LMP 07/15/2015 (Within Days)  BP/Weight 06/17/2016 05/21/2016 03/16/2016  Systolic BP 126 102 121  Diastolic BP 74 68 71  Wt. (Lbs) 147.25 148.5 -  BMI 24.5 24.71 -   Physical Exam  Constitutional: She is oriented to person, place, and time. She appears well-developed. No distress.  Cardiovascular: Normal rate and regular rhythm.   Pulmonary/Chest: Effort normal. She has no wheezes. She has no rales.  Musculoskeletal:  Shoulder: Left Inspection reveals no abnormalities, atrophy  or asymmetry. Palpation - no tenderness over AC joint.  ROM is full in all planes. Rotator cuff strength normal throughout. No signs of impingement with negative Neer and Hawkin's tests, empty can. Speeds and Yergason's tests normal.  Negative Tinel's & Phalens tests. Negative Spurling's.  No painful arc and no drop arm sign.  Neurological: She is alert and oriented to person, place, and time.  Psychiatric:  Flat affect.  Vitals reviewed.   Lab Results  Component Value Date   WBC 8.2 03/16/2016   HGB 10.7* 03/16/2016   HCT 31.7* 03/16/2016   PLT 186 03/16/2016   GLUCOSE 111* 03/16/2016   CHOL 186 11/11/2015   TRIG 217.0* 11/11/2015   HDL 42.80 11/11/2015   LDLDIRECT 110.0 11/11/2015   ALT 17 03/14/2016   AST 33 03/14/2016   NA 136 03/16/2016   K 3.6 03/16/2016   CL 112* 03/16/2016   CREATININE 0.66 03/16/2016   BUN 13 03/16/2016   CO2 20* 03/16/2016   TSH 1.55 11/11/2015   Assessment & Plan:   Problem List Items Addressed This Visit    Neck pain - Primary    New problem. Suspect that patient's pain is coming from cervical disc disease vs. Muscular.  Shoulder exam normal. Xray of the neck today. Advised short increase in Prednisone if needed. Stop NSAID's.       Relevant Orders   DG Cervical  Spine Complete     Follow-up: PRN  Everlene Other DO [facility]

## 2016-07-06 LAB — HM PAP SMEAR: HM Pap smear: NORMAL

## 2016-08-10 ENCOUNTER — Encounter: Payer: Self-pay | Admitting: Family Medicine

## 2016-08-10 ENCOUNTER — Ambulatory Visit (INDEPENDENT_AMBULATORY_CARE_PROVIDER_SITE_OTHER): Payer: BLUE CROSS/BLUE SHIELD | Admitting: Family Medicine

## 2016-08-10 VITALS — BP 99/67 | HR 97 | Temp 98.6°F | Resp 16 | Ht 64.0 in | Wt 137.4 lb

## 2016-08-10 DIAGNOSIS — E781 Pure hyperglyceridemia: Secondary | ICD-10-CM | POA: Diagnosis not present

## 2016-08-10 DIAGNOSIS — Z7952 Long term (current) use of systemic steroids: Secondary | ICD-10-CM | POA: Diagnosis not present

## 2016-08-10 DIAGNOSIS — F418 Other specified anxiety disorders: Secondary | ICD-10-CM | POA: Diagnosis not present

## 2016-08-10 DIAGNOSIS — E785 Hyperlipidemia, unspecified: Secondary | ICD-10-CM | POA: Insufficient documentation

## 2016-08-10 DIAGNOSIS — J4530 Mild persistent asthma, uncomplicated: Secondary | ICD-10-CM | POA: Diagnosis not present

## 2016-08-10 DIAGNOSIS — B009 Herpesviral infection, unspecified: Secondary | ICD-10-CM | POA: Diagnosis not present

## 2016-08-10 DIAGNOSIS — M329 Systemic lupus erythematosus, unspecified: Secondary | ICD-10-CM | POA: Diagnosis not present

## 2016-08-10 DIAGNOSIS — R000 Tachycardia, unspecified: Secondary | ICD-10-CM

## 2016-08-10 DIAGNOSIS — G4700 Insomnia, unspecified: Secondary | ICD-10-CM

## 2016-08-10 DIAGNOSIS — F329 Major depressive disorder, single episode, unspecified: Secondary | ICD-10-CM

## 2016-08-10 DIAGNOSIS — F32A Depression, unspecified: Secondary | ICD-10-CM

## 2016-08-10 DIAGNOSIS — K219 Gastro-esophageal reflux disease without esophagitis: Secondary | ICD-10-CM

## 2016-08-10 DIAGNOSIS — F419 Anxiety disorder, unspecified: Secondary | ICD-10-CM

## 2016-08-10 MED ORDER — CITALOPRAM HYDROBROMIDE 20 MG PO TABS: 20.0000 mg | ORAL_TABLET | Freq: Every day | ORAL | 1 refills | Status: DC

## 2016-08-10 MED ORDER — NAPROXEN SODIUM 220 MG PO TABS: 220.0000 mg | ORAL_TABLET | Freq: Every day | ORAL | Status: AC

## 2016-08-10 NOTE — Assessment & Plan Note (Signed)
Last lipid panel 03/2015, with elevated TG 217. Has implemented lifestyle modifications. Not on therapy currently Follow-up fasting lipid panel within 6 months

## 2016-08-10 NOTE — Assessment & Plan Note (Signed)
Currently stable, chronic problem since 1995. Currently controlled on Plaquenil 200mg  BID, chronic prednisone 6mg  daily. Followed by Rheumatology [facility], Dr Renard Matter)  Plan: 1. Continue current regimen with Rheum 2. Continue PRN Aleve at night, topical Voltaren 3. Follow-up with Ophthalmology on plaquenil

## 2016-08-10 NOTE — Assessment & Plan Note (Signed)
Stable without recent flare, controlled on Mometasone 2 puffs daily. Xopenx PRN infrequently used.

## 2016-08-10 NOTE — Assessment & Plan Note (Signed)
Currently stable on Verapmail. Followed by Cardiology Dr Kirke Corin [facility]) Follow-up as planned, per Cardiology may monitor repeat ECHO, given SLE

## 2016-08-10 NOTE — Progress Notes (Signed)
Subjective:    Patient ID: Christina Meza, female    DOB: 04/23/1966, 50 y.o.   MRN: [id number]  Christina Meza is a 50 y.o. female presenting on 08/10/2016 for Establish Care  Establishing care here, previously at [facility] primary care, would like a change of setting for new PCP.  HPI  LUPUS / SLE, Chronic: - Initially diagnosed with SLE in 1995 by Rheumatology in [place], now followed by [facility] (Dr Renard Matter), follows every 3 months, currently treated with Plaquenil 200mg  BID and Chronic prednisone daily 6mg . States she has been on chronic pred since 2000, lowest dose was 4mg  daily but too many flares. - Describes chronic joint pain with SLE as primary symptom, mostly smaller joints hands, fingers, elbows, bilateral, currently without significant pain or flare. - Also takes topical Voltaren gel 1% as needed with relief, Aleve 220mg  nightly PRN (unable to take celebrex due to h/o PUD) - Admits chronic intermittent nausea, possible med side effect  Anxiety / Adjustment disorder: - Chronic problem over past 3 years, onset most consistent with increased stress / anxiety related to acting as primary caregiver for ill parents and living in same household. Significant psych history with Mother see below. Diagnosed with anxiety in 2014, started on Sertraline 25 up to 50mg  without significant relief. - Today interested in alternative medication for anxiety. Never been on any other meds. Never seen psychiatry or therapy/counseling - Admits to insomnia, see below - Denies any depression, sadness, guilt, history of bipolar, mania - Denies any suicidal or homicidal ideation  INSOMNIA, Chronic - See above Anxiety - Reports chronic insomnia with difficulty initiating and maintaining sleep for past 3 years around similar time of diagnosis anxiety. Initially tried OTC sleep aids including melatonin, ineffective, has been on sertraline without relief, and had trial briefly with 0.5  Xanax without help. - States she adheres to most sleep hygiene, limited caffeine only green tea x 1 in AM, regular exercise with walking, no screen / TV before bed - Currently takes Ambien for past 3 years with significant improvement, nightly not on PRN basis, has been lowered appropriately to 5mg  daily. Never tried CR.  Mild Persistent Asthma, Controlled - Reports chronic problem with asthma for years, worse in [place] during winter, especially with URI or other triggers. Now seems much improved in [place]. Controlled on Mometasone 2 puffs daily, and infrequent Xopenex PRN use < 1 monthly.  HYPERLIPIDEMIA, H/o HyperTG - Reports no new concerns today. Last lipid panel 03/2015 with elevated TG, not on any therapy. Never on statin. 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  Additional Social History: - She moved from [place] 1 year ago, currently lives with her husband and her parents. 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 Recent worsening health of her father, stayed in ICU discharged to home over 1 week ago.  Mother has bipolar and schizophrenia.  - Works full time as Scientist, clinical (histocompatibility and immunogenetics) at [employer], additionally had hospital employment testing >1 year ago, had indeterminate TB testing, treated with INH x 9 months, developed peripheral neuropathy treated with gabapentin, could not tolerate, discontinued both after 6 months.  Past Medical History:  Diagnosis Date  . Allergy   . Arthritis   . Asthma   .  C. difficile diarrhea [id number]  . Clostridium difficile infection    2015x2 , 2016x1  . Depression   . Genital warts   . GERD (gastroesophageal reflux disease)   . Inappropriate sinus tachycardia (HCC)   . Migraine   . Palpitations   . SLE  (systemic lupus erythematosus) (HCC)   . Stomach ulcer    Social History   Social History  . Marital status: Married    Spouse name: N/A  . Number of children: N/A  . Years of education: N/A   Occupational History  . Not on file.   Social History Main Topics  . Smoking status: Never Smoker  . Smokeless tobacco: Never Used  . Alcohol use 0.0 - 0.6 oz/week  . Drug use: No  . Sexual activity: Yes    Partners: Male   Other Topics Concern  . 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      Family History  Problem Relation Age of Onset  . Arthritis Mother   . Bipolar disorder Mother   . Depression Mother   . Mental illness Mother   . Hypertension Maternal Grandmother   . Hyperlipidemia Maternal Grandfather   . Heart disease Maternal Grandfather   . Stroke Maternal Grandfather   . Hypertension Maternal Grandfather   . Colon cancer Paternal Grandfather   . Breast cancer Neg Hx    Current Outpatient Prescriptions on File Prior to Visit  Medication Sig  . acetaminophen (TYLENOL) 650 MG suppository Place 650 mg rectally every 4 (four) hours as needed.  . Biotin 5000 MCG TABS Take by mouth.  . Cholecalciferol (VITAMIN D) 2000 UNITS tablet Take 2,000 Units by mouth daily.  . diclofenac sodium (VOLTAREN) 1 % GEL Apply 1 application topically as needed.  . hydroxychloroquine (PLAQUENIL) 200 MG tablet Take 200 mg by mouth 2 (two) times daily.   Marland Kitchen. levalbuterol (XOPENEX HFA) 45 MCG/ACT inhaler Inhale 1 puff into the lungs every 4 (four) hours as needed for wheezing. Reported on 03/14/2016  . metronidazole (NORITATE) 1 % cream Apply topically daily.  . mometasone (ASMANEX) 220 MCG/INH inhaler Inhale 2 puffs into the lungs daily.  . Multiple Vitamins-Minerals (MULTIVITAMIN WITH MINERALS) tablet Take 1 tablet by mouth every morning.   . ondansetron (ZOFRAN) 4 MG tablet Take 1 tablet (4 mg total) by mouth every 8 (eight) hours as needed for nausea or vomiting.  .  pantoprazole (PROTONIX) 40 MG tablet Take 1 tablet (40 mg total) by mouth daily.  . predniSONE (DELTASONE) 5 MG tablet Take 6 mg by mouth every other day.   . Probiotic Product (PROBIOTIC DAILY PO) Take 1 tablet by mouth daily.   . valACYclovir (VALTREX) 500 MG tablet Take 500 mg by mouth at bedtime.  . verapamil (CALAN-SR) 120 MG CR tablet Take 1 tablet (120 mg total) by mouth at bedtime.  . vitamin E 400 UNIT capsule Take 400 Units by mouth daily.  Marland Kitchen. zolpidem (AMBIEN) 5 MG tablet Take 1 tablet (5 mg total) by mouth at bedtime as needed for sleep.   No current facility-administered medications on file prior to visit.     Review of Systems Per HPI unless specifically indicated above     Objective:    BP 99/67 (BP Location: Left Arm, Patient Position: Sitting, Cuff Size: Normal)   Pulse 97   Temp 98.6 F (37 C) (Oral)   Resp 16   Ht 5\' 4"  (1.626 m)   Wt 137 lb  6.4 oz (62.3 kg)   LMP 07/15/2015 (Within Days)   BMI 23.58 kg/m   Wt Readings from Last 3 Encounters:  08/10/16 137 lb 6.4 oz (62.3 kg)  06/17/16 147 lb 4 oz (66.8 kg)  05/21/16 148 lb 8 oz (67.4 kg)    Physical Exam  Constitutional: She is oriented to person, place, and time. She appears well-developed and well-nourished. No distress.  Well-appearing, comfortable, cooperative, pleasant  HENT:  Head: Normocephalic and atraumatic.  Mouth/Throat: Oropharynx is clear and moist.  Eyes: Conjunctivae and EOM are normal. Pupils are equal, round, and reactive to light.  Neck: Normal range of motion. Neck supple. No thyromegaly present.  Cardiovascular: Normal rate, regular rhythm, normal heart sounds and intact distal pulses.   No murmur heard. Pulmonary/Chest: Effort normal and breath sounds normal. No respiratory distress. She has no wheezes. She has no rales.  Abdominal: Soft. Bowel sounds are normal. She exhibits no distension and no mass. There is no tenderness.  Musculoskeletal: Normal range of motion. She exhibits no  edema or tenderness.  Lymphadenopathy:    She has no cervical adenopathy.  Neurological: She is alert and oriented to person, place, and time.  Skin: Skin is warm and dry. No rash noted. She is not diaphoretic. There is erythema (Very mild facial erythema bilateral cheeks without eruption or rash).  Psychiatric: She has a normal mood and affect. Her behavior is normal. Thought content normal.  Nursing note and vitals reviewed.  Results for orders placed or performed in visit on 08/10/16  HM MAMMOGRAPHY  Result Value Ref Range   HM Mammogram Self Reported Normal 0-4 Bi-Rad, Self Reported Normal  HM PAP SMEAR  Result Value Ref Range   HM Pap smear normal   HM COLONOSCOPY  Result Value Ref Range   HM Colonoscopy Patient Reported See Report (in chart), Patient Reported      Assessment & Plan:   Problem List Items Addressed This Visit    SLE (systemic lupus erythematosus) (HCC) - Primary    Currently stable, chronic problem since 1995. Currently controlled on Plaquenil 200mg  BID, chronic prednisone 6mg  daily. Followed by Rheumatology [facility], Dr Renard Matter)  Plan: 1. Continue current regimen with Rheum 2. Continue PRN Aleve at night, topical Voltaren 3. Follow-up with Ophthalmology on plaquenil      Long term current use of systemic steroids    Continue Prednisone 6mg  daily for SLE, per Rheumatology      Insomnia    Suboptimal control, but improved on daily Ambien 5mg , still some difficulty with sleep maintenance, likely multifactorial primarily related to underlying poorly controlled chronic anxiety  Plan: 1. Change treatment of Anxiety, discontinue Sertraline and start Celexa 2. Continue Ambien 5mg  at night, no new refill needed today, rx at next apt if needed, consider future rx sustained/continued release may provide more benefit of sleep maintenance if remains uncontrolled 3. Counseled on sleep hygiene      Inappropriate sinus tachycardia (HCC)    Currently stable on  Verapmail. Followed by Cardiology Dr Kirke Corin [facility]) Follow-up as planned, per Cardiology may monitor repeat ECHO, given SLE      Hypertriglyceridemia    Last lipid panel 03/2015, with elevated TG 217. Has implemented lifestyle modifications. Not on therapy currently Follow-up fasting lipid panel within 6 months      HSV-2 (herpes simplex virus 2) infection    Controlled on chronic suppression therapy with Valtrex 500mg  nightly      GERD (gastroesophageal reflux disease)  Continue on Protonix      Asthma, chronic    Stable without recent flare, controlled on Mometasone 2 puffs daily. Xopenx PRN infrequently used.      Anxiety and depression    Inadequate control with chronic anxiety >3 years suspected underlying etiology includes significant life stressors with primary caregiver for parents, also +psych fam history Currently on Sertraline 50mg  daily without improvement. No significant depressive symptoms, secondary insomnia.  Plan: 1. Discontinue Sertraline 50mg  daily - Start Celexa 20mg  daily - cut in half 10mg  daily x 1 week, then continue 20 for 4-6 weeks 2. Will provide information for patient self referral to local Therapy/Counseling, [facility] vs [facility]. 3. Follow-up 4-6 weeks adjust Celexa dose      Relevant Medications   citalopram (CELEXA) 20 MG tablet    Other Visit Diagnoses   None.     Meds ordered this encounter  Medications  . DISCONTD: mometasone (ASMANEX) 220 MCG/INH inhaler    Sig: Inhale into the lungs.  Marland Kitchen DISCONTD: citalopram (CELEXA) 20 MG tablet    Sig: Take 1 tablet (20 mg total) by mouth daily. Take half tablet 10mg  for 1 week, then take whole tab 20mg  daily.    Dispense:  30 tablet    Refill:  1  . citalopram (CELEXA) 20 MG tablet    Sig: Take 1 tablet (20 mg total) by mouth daily. Take half tablet 10mg  for 1 week, then take whole tab 20mg  daily.    Dispense:  30 tablet    Refill:  1  . naproxen  sodium (ALEVE) 220 MG tablet    Sig: Take 1 tablet (220 mg total) by mouth at bedtime.      Follow up plan: Return in about 6 weeks (around 09/21/2016) for anxiety, med adjust.  Saralyn Pilar, DO [facility] 08/10/2016, 1:00 PM

## 2016-08-10 NOTE — Assessment & Plan Note (Signed)
Suboptimal control, but improved on daily Ambien 5mg , still some difficulty with sleep maintenance, likely multifactorial primarily related to underlying poorly controlled chronic anxiety  Plan: 1. Change treatment of Anxiety, discontinue Sertraline and start Celexa 2. Continue Ambien 5mg  at night, no new refill needed today, rx at next apt if needed, consider future rx sustained/continued release may provide more benefit of sleep maintenance if remains uncontrolled 3. Counseled on sleep hygiene

## 2016-08-10 NOTE — Assessment & Plan Note (Signed)
Controlled on chronic suppression therapy with Valtrex 500mg  nightly

## 2016-08-10 NOTE — Assessment & Plan Note (Signed)
Inadequate control with chronic anxiety >3 years suspected underlying etiology includes significant life stressors with primary caregiver for parents, also +psych fam history Currently on Sertraline 50mg  daily without improvement. No significant depressive symptoms, secondary insomnia.  Plan: 1. Discontinue Sertraline 50mg  daily - Start Celexa 20mg  daily - cut in half 10mg  daily x 1 week, then continue 20 for 4-6 weeks 2. Will provide information for patient self referral to local Therapy/Counseling, [facility] vs [facility]. 3. Follow-up 4-6 weeks adjust Celexa dose

## 2016-08-10 NOTE — Assessment & Plan Note (Signed)
Continue on Protonix

## 2016-08-10 NOTE — Assessment & Plan Note (Signed)
Continue Prednisone 6mg  daily for SLE, per Rheumatology

## 2016-08-10 NOTE — Patient Instructions (Signed)
Thank you for coming in to clinic today.  1. For your anxiety, we will discontinue Sertraline, the day you stop it, please replace the dose with Celexa, start at half tab or 10mg  daily for 1 week, then increase to 1 whole tab 20mg  daily for next 4-6 weeks, we will consider adjusting dose next visit, max dose is up to 40mg  daily  2. Ordered referral to Therapy for counseling, stay tuned for this appointment  3. Follow-up in approx 03/2017 for annual physical and lab works, remember to work on improving diet reducing carbs / sugar, continue regular walking, to control cholesterol  Due for flu shot anytime this season  Please schedule a follow-up appointment with Dr. Althea Charon in 4-6 weeks to follow-up Anxiety / Medication   If you have any other questions or concerns, please feel free to call the clinic or send a message through MyChart. You may also schedule an earlier appointment if necessary.  Christina Pilar, DO [facility], [place]

## 2016-09-03 ENCOUNTER — Ambulatory Visit: Payer: BLUE CROSS/BLUE SHIELD

## 2016-09-22 ENCOUNTER — Telehealth: Payer: Self-pay | Admitting: Cardiovascular Disease

## 2016-09-22 ENCOUNTER — Encounter: Payer: Self-pay | Admitting: Family Medicine

## 2016-09-22 ENCOUNTER — Ambulatory Visit (INDEPENDENT_AMBULATORY_CARE_PROVIDER_SITE_OTHER): Payer: BLUE CROSS/BLUE SHIELD | Admitting: Family Medicine

## 2016-09-22 VITALS — BP 118/73 | HR 83 | Temp 98.3°F | Resp 16 | Ht 64.0 in | Wt 138.8 lb

## 2016-09-22 DIAGNOSIS — G4709 Other insomnia: Secondary | ICD-10-CM

## 2016-09-22 DIAGNOSIS — F329 Major depressive disorder, single episode, unspecified: Secondary | ICD-10-CM

## 2016-09-22 DIAGNOSIS — R590 Localized enlarged lymph nodes: Secondary | ICD-10-CM

## 2016-09-22 DIAGNOSIS — F32A Depression, unspecified: Secondary | ICD-10-CM

## 2016-09-22 DIAGNOSIS — F419 Anxiety disorder, unspecified: Principal | ICD-10-CM

## 2016-09-22 DIAGNOSIS — F418 Other specified anxiety disorders: Secondary | ICD-10-CM | POA: Diagnosis not present

## 2016-09-22 DIAGNOSIS — M328 Other forms of systemic lupus erythematosus: Secondary | ICD-10-CM | POA: Diagnosis not present

## 2016-09-22 MED ORDER — CITALOPRAM HYDROBROMIDE 20 MG PO TABS: 20.0000 mg | ORAL_TABLET | Freq: Every day | ORAL | 3 refills | Status: DC

## 2016-09-22 NOTE — Assessment & Plan Note (Signed)
Stable to improved off Ambien 5mg . Self discontinued, and no change in sleep. - Still difficulty with sleep maintenance, improved initiating sleep  Plan: 1. Offered Ambien CR 6.25mg  nightly, but patient not ready to start this today, will consider for future 2. Continue following sleep hygiene 3. Follow-up 6 mo

## 2016-09-22 NOTE — Assessment & Plan Note (Signed)
Significant improvement on Celexa 20mg  daily. Recent stressor with passing of father, tolerated well, mother still living with her. - Chronic problem prior with inadequate control on Sertraline (now off) - Secondary insomnia - PHQ score inc from 7 to 9, but clinically much improved  Plan: 1. Continue Celexa 20mg  daily, considered future increase to 40mg  daily, will hold off for now 2. May establish with therapy as needed 3. Follow-up 6 months for annual physical, re-eval depression/anxiety

## 2016-09-22 NOTE — Telephone Encounter (Signed)
[facility] faxed possible drug interaction concerns. Per Dr. Kirke Corin, pt should not take tizanidine and verapamil d/t interaction. Reviewed information w/pt. She states rheumatologist prescribed tizanidine. She had it filled but has not taken any.  Pt verbalized understanding to not take tizanidine while taking verapamil.

## 2016-09-22 NOTE — Assessment & Plan Note (Signed)
Stable, chronic problem. Followed by local Rheumatology [facility]) continues Plaquenil and Prednisone - Per patient request today, placed referral to [facility] (Dr Perlie Mayo), same Rheum as her mother, she wants to get different opinion and re-establish

## 2016-09-22 NOTE — Assessment & Plan Note (Signed)
Suspected benign isolated acute L axillary LAD x 4 days, not consistent with focal infection vs abscess given exam and lack of other symptoms. No additional concerning symptoms for more systemic problem vs malignancy. - Up to date on mammogram, last 11/2015 negative  Plan: 1. Reassurance, likely self limited within 4-6 weeks, no treatment today - may use topical lidocaine 2. Monitor for change, inc size, pain, swelling, other LAD, worsening symptoms, return criteria given 3. Follow-up if not resolving in 4-6 weeks to re-check

## 2016-09-22 NOTE — Patient Instructions (Signed)
Thank you for coming in to clinic today.  1. For your Depression / Anxiety - Continue Celexa 20mg  daily - as discussed, sent to your mail order pharmacy for 90 day supply, if you feel like you may need the higher dose up to 40mg  over next several weeks or months, please let me know and we can adjust this - Remember to establish with a therapist if you feel like this would help, at least to have someone to talk to if stress changed in future  If you want to consider Ambien continued release, we can discuss next time.  Referral to Dr Perlie Mayo [facility] sent, call their office to follow-up appointment time  You will be due for in FASTING BLOOD WORK (no food or drink after midnight before, only water or coffee without cream/sugar on the morning of)  - Please go ahead and schedule a "Lab Only" visit in the morning at the clinic for lab draw in April 2018 before next Annual Physical - Make sure Lab Only appointment is at least 1-2 weeks before your next appointment, so that results will be available  For Lab Results, once available within 2-3 days of blood draw, you can can log in to MyChart online to view your results and a brief explanation. If you prefer let us know to contact you by phone with results OR we can discuss at next follow-up visit.  Please schedule a follow-up for Left arm lymph node swelling IF not resolved or improving after 4 to 6 weeks  Please schedule a follow-up appointment with Dr. Althea Charon around 03/2017 for Annual Physical with labs and follow-up depression, anxiety, sleep   If you have any other questions or concerns, please feel free to call the clinic or send a message through MyChart. You may also schedule an earlier appointment if necessary.  Saralyn Pilar, DO [facility], [place]

## 2016-09-22 NOTE — Progress Notes (Signed)
Subjective:    Patient ID: Christina Meza, female    DOB: 10-15-1966, 50 y.o.   MRN: [id number]  Christina Meza is a 50 y.o. female presenting on 09/22/2016 for Anxiety (improved with meds ) and Cyst (underarm onset 5 days)  HPI  Depression / Anxiety / Adjustment disorder: - Recently established care on 08/10/16, discussion about chronic anxiety and depression, most consistent with adjustment disorder due to changes in life with stressors, admitted a lot of stress due to ill family members and both parents now living with her again. She was started on Celexa taper up to 20mg  daily at that time. - Today presents to follow-up depression/anxiety. She took Celexa half tab 10mg  for 1 week and then increased up to 20mg  whole tab daily, she tolerated well without significant side effects. Admits significant improvement over past 6 weeks, reduced stress levels, feels more "like herself", compared to prior treatment on chronic Sertraline 50mg  (this made her unable to cry or have more emotions) - Recent stressor with her father passing away at end of 08/2016, she was able to grieve and cry and overall felt better emotionally - She is comfortable today on current dose, not interested in increasing - Did not establish with therapy yet - Denies significant med side effects - Admits to associated insomnia, see below, admits some fatigue and decreased energy still - Denies any suicidal or homicidal ideation, worsening sadness  INSOMNIA, Chronic - Reports recently she was feeling better with Celexa, decided to try off Ambien 5mg  nightly. She noticed essentially no change off of Ambien nightly for 3 weeks, and has not stopped taking it completely. She still has same problem of difficulty maintaining sleep now, less problem initiating sleep. She will wake up to void usually, and then has difficulty falling back asleep. - Continues to follow sleep hygiene, limited caffeine - Denies excessive sleepiness,  tired, racing thoughts or anxiety keeping awake  Left Axillary Lymphadenopathy: - Reported new problem left midline axilla small 1 cm round "bump" first noticed x 4 days ago, slight discoloration of skin on top but did not have redness, swelling. Does not shave armpit regularly but has recently. Mild tender to touch. Has not had swollen lymph node similar to this before. - Denies other lymph nodes swollen, drainage of pus, upper extremity / head neck scratch or recent illness, fever/chills, sweats, unintentional weight loss  LUPUS / SLE, Chronic: - Chronic problem, followed by Rheumatology at [facility], still taking Plaquenil and chronic prednisone - No recent joint pain flares, thought she may be getting a flare due to recent stressor of father passing, last visit with Rheum she was interested to re-try [facility], as she had attempted previously for 3 months. Not offered to her. - She is interested in re-establishing at different Rheumatology office, would like to go to same doctor as her mother, Dr Perlie Mayo [facility]), requests referral today  Review of Systems Per HPI unless specifically indicated above   Depression screen [facility] 2/9 09/22/2016 08/10/2016 08/10/2016  Decreased Interest 0 1 1  Down, Depressed, Hopeless 1 1 1   PHQ - 2 Score 1 2 2   Altered sleeping 3 3 3   Tired, decreased energy 3 1 1   Change in appetite 1 0 0  Feeling bad or failure about yourself  0 0 0  Trouble concentrating 1 1 1   Moving slowly or fidgety/restless 0 0 0  Suicidal thoughts 0 0 0  PHQ-9 Score 9 7 7   Difficult doing work/chores Not difficult  at all Not difficult at all -        Objective:    BP 118/73   Pulse 83   Temp 98.3 F (36.8 C) (Oral)   Resp 16   Ht 5\' 4"  (1.626 m)   Wt 138 lb 12.8 oz (63 kg)   LMP 07/15/2015 (Within Days)   BMI 23.82 kg/m   Wt Readings from Last 3 Encounters:  09/22/16 138 lb 12.8 oz (63 kg)  08/10/16 137 lb 6.4 oz (62.3 kg)  06/17/16 147 lb 4 oz (66.8  kg)    Physical Exam  Constitutional: She is oriented to person, place, and time. She appears well-developed and well-nourished. No distress.  Well-appearing, comfortable, cooperative  HENT:  Head: Normocephalic and atraumatic.  Mouth/Throat: Oropharynx is clear and moist.  Neck: Normal range of motion. Neck supple. No thyromegaly present.  Cardiovascular: Normal rate and intact distal pulses.   Pulmonary/Chest: Effort normal.  Musculoskeletal: Normal range of motion. She exhibits no edema or tenderness.  Lymphadenopathy:    She has no cervical adenopathy.    She has axillary adenopathy (Left isolated 1 cm discrete mildly tender presumed lymph node, mild tender to touch, very minimal discoloration on skin without erythema, no induration or drainage).  Neurological: She is alert and oriented to person, place, and time.  Skin: Skin is warm and dry. No rash noted. She is not diaphoretic.  Psychiatric: She has a normal mood and affect. Her behavior is normal. Thought content normal.  Well groomed, good eye contact, normal speech  Nursing note and vitals reviewed.     Assessment & Plan:   Problem List Items Addressed This Visit    SLE (systemic lupus erythematosus) (HCC)    Stable, chronic problem. Followed by local Rheumatology [facility]) continues Plaquenil and Prednisone - Per patient request today, placed referral to [facility] (Dr Perlie Mayo), same Rheum as her mother, she wants to get different opinion and re-establish      Relevant Orders   Ambulatory referral to Rheumatology   Insomnia    Stable to improved off Ambien 5mg . Self discontinued, and no change in sleep. - Still difficulty with sleep maintenance, improved initiating sleep  Plan: 1. Offered Ambien CR 6.25mg  nightly, but patient not ready to start this today, will consider for future 2. Continue following sleep hygiene 3. Follow-up 6 mo      Axillary lymphadenopathy    Suspected benign isolated acute  L axillary LAD x 4 days, not consistent with focal infection vs abscess given exam and lack of other symptoms. No additional concerning symptoms for more systemic problem vs malignancy. - Up to date on mammogram, last 11/2015 negative  Plan: 1. Reassurance, likely self limited within 4-6 weeks, no treatment today - may use topical lidocaine 2. Monitor for change, inc size, pain, swelling, other LAD, worsening symptoms, return criteria given 3. Follow-up if not resolving in 4-6 weeks to re-check      Anxiety and depression - Primary    Significant improvement on Celexa 20mg  daily. Recent stressor with passing of father, tolerated well, mother still living with her. - Chronic problem prior with inadequate control on Sertraline (now off) - Secondary insomnia - PHQ score inc from 7 to 9, but clinically much improved  Plan: 1. Continue Celexa 20mg  daily, considered future increase to 40mg  daily, will hold off for now 2. May establish with therapy as needed 3. Follow-up 6 months for annual physical, re-eval depression/anxiety      Relevant Medications  citalopram (CELEXA) 20 MG tablet    Other Visit Diagnoses   None.     Meds ordered this encounter  Medications  .       . citalopram (CELEXA) 20 MG tablet    Sig: Take 1 tablet (20 mg total) by mouth daily.    Dispense:  90 tablet    Refill:  3      Follow up plan: Return in about 6 months (around 03/23/2017) for Annual physical, labs.  Saralyn PilarAlexander Adajah Cocking, DO [facility] 09/22/2016, 5:12 PM

## 2016-10-31 ENCOUNTER — Other Ambulatory Visit: Payer: Self-pay | Admitting: Cardiovascular Disease

## 2016-11-12 ENCOUNTER — Encounter: Payer: Self-pay | Admitting: Nurse Practitioner

## 2016-11-12 ENCOUNTER — Ambulatory Visit (INDEPENDENT_AMBULATORY_CARE_PROVIDER_SITE_OTHER): Payer: BLUE CROSS/BLUE SHIELD | Admitting: Nurse Practitioner

## 2016-11-12 VITALS — BP 102/64 | HR 94 | Ht 64.0 in | Wt 141.2 lb

## 2016-11-12 DIAGNOSIS — M329 Systemic lupus erythematosus, unspecified: Secondary | ICD-10-CM | POA: Diagnosis not present

## 2016-11-12 DIAGNOSIS — R9431 Abnormal electrocardiogram [ECG] [EKG]: Secondary | ICD-10-CM | POA: Diagnosis not present

## 2016-11-12 DIAGNOSIS — R000 Tachycardia, unspecified: Secondary | ICD-10-CM

## 2016-11-12 MED ORDER — VERAPAMIL HCL ER 120 MG PO TBCR: 120.0000 mg | EXTENDED_RELEASE_TABLET | Freq: Every day | ORAL | 3 refills | Status: DC

## 2016-11-12 NOTE — Progress Notes (Signed)
Office Visit    Patient Name: Christina Meza Date of Encounter: 11/12/2016  Primary Care Provider:  Saralyn Pilar, DO Primary Cardiologist:  Judie Petit. Kirke Corin, MD   Chief Complaint    50 year old female with a history of inappropriate sinus tachycardia, prolonged QT, and lupus, who presents for annual follow-up.  Past Medical History    Past Medical History:  Diagnosis Date  . Allergy   . Arthritis   . Asthma   . C. difficile diarrhea [id number]  . Clostridium difficile infection    2015x2 , 2016x1  . Depression   . Genital warts   . GERD (gastroesophageal reflux disease)   . Inappropriate sinus tachycardia    a. 2010 Nuc Stress test: no ischemia/infarct;  b. 10/2013 Echo: Nl LV fxn, mild MR/TR.  . Migraine   . Palpitations   . Prolonged QT interval   . SLE (systemic lupus erythematosus) (HCC)   . Stomach ulcer    Past Surgical History:  Procedure Laterality Date  . BREAST CYST ASPIRATION Left 4-5 yrs ago   NEG  . CATARACT EXTRACTION  2008  . ENDOSCOPIC VEIN LASER TREATMENT    . UMBILICAL HERNIA REPAIR    . vulvar excision for HPV  07/04/2014    Allergies  Allergies  Allergen Reactions  . Bactrim [Sulfamethoxazole-Trimethoprim] Itching  . Celebrex [Celecoxib] Other (See Comments)    Recommended not taking due to a stomach issue.   . Lasix [Furosemide] Itching  . Sulfa Antibiotics Itching    History of Present Illness    50 year old female with the above complex past medical history including systemic lupus erythematosus, prolonged QT, inappropriate sinus tachycardia, depression, asthma, and GERD. She was last seen in clinic 1 year ago. She had a negative stress test in 2010 with normal LV function by echo in 2014. She has done well over the past year without any episodes of palpitations or inappropriate tachycardia. She remains on verapamil therapy and tolerates this well. She remains active without chest pain, dyspnea, dizziness, syncope, or  edema.  Home Medications    Prior to Admission medications   Medication Sig Start Date End Date Taking? Authorizing Provider  acetaminophen (TYLENOL) 650 MG suppository Place 650 mg rectally every 4 (four) hours as needed.   Yes Historical Provider, MD  Biotin 5000 MCG TABS Take by mouth.   Yes Historical Provider, MD  Cholecalciferol (VITAMIN D3) 3000 units TABS Take by mouth daily.   Yes Historical Provider, MD  citalopram (CELEXA) 20 MG tablet Take 1 tablet (20 mg total) by mouth daily. 09/22/16  Yes Alexander Fredric Mare, DO  diclofenac sodium (VOLTAREN) 1 % GEL Apply 1 application topically as needed.   Yes Historical Provider, MD  hydroxychloroquine (PLAQUENIL) 200 MG tablet Take 200 mg by mouth 2 (two) times daily.    Yes Historical Provider, MD  levalbuterol (XOPENEX HFA) 45 MCG/ACT inhaler Inhale 1 puff into the lungs every 4 (four) hours as needed for wheezing. Reported on 03/14/2016   Yes Historical Provider, MD  metronidazole (NORITATE) 1 % cream Apply topically daily.   Yes Historical Provider, MD  mometasone [facility]) 220 MCG/INH inhaler Inhale 2 puffs into the lungs daily.   Yes Historical Provider, MD  Multiple Vitamins-Minerals (MULTIVITAMIN WITH MINERALS) tablet Take 1 tablet by mouth every morning.    Yes Historical Provider, MD  naproxen sodium (ALEVE) 220 MG tablet Take 1 tablet (220 mg total) by mouth at bedtime. 08/10/16  Yes Alexander Fredric Mare, DO  ondansetron [facility]) 4  MG tablet Take 1 tablet (4 mg total) by mouth every 8 (eight) hours as needed for nausea or vomiting. 05/21/16  Yes Tommie SamsJayce G Cook, DO  pantoprazole (PROTONIX) 40 MG tablet Take 1 tablet (40 mg total) by mouth daily. 05/21/16  Yes Tommie SamsJayce G Cook, DO  predniSONE (DELTASONE) 5 MG tablet Take 7 mg by mouth daily with breakfast.    Yes Historical Provider, MD  Probiotic Product (PROBIOTIC DAILY PO) Take 1 tablet by mouth daily.    Yes Historical Provider, MD  valACYclovir (VALTREX) 500 MG tablet Take 500 mg by  mouth at bedtime.   Yes Historical Provider, MD  verapamil (CALAN-SR) 120 MG CR tablet Take 1 tablet (120 mg total) by mouth at bedtime. 11/12/16  Yes Ok Anishristopher R Brynnley Dayrit, NP  vitamin E 400 UNIT capsule Take 400 Units by mouth daily.   Yes Historical Provider, MD  zolpidem (AMBIEN) 5 MG tablet Take 1 tablet (5 mg total) by mouth at bedtime as needed for sleep. 05/21/16  Yes Tommie SamsJayce G Cook, DO    Review of Systems    She denies chest pain, palpitations, dyspnea, pnd, orthopnea, n, v, dizziness, syncope, edema, weight gain, or early satiety.  All other systems reviewed and are otherwise negative except as noted above.  Physical Exam    VS:  BP 102/64 (BP Location: Left Arm, Patient Position: Sitting, Cuff Size: Normal)   Pulse 94   Ht 5\' 4"  (1.626 m)   Wt 141 lb 4 oz (64.1 kg)   LMP 07/15/2015 (Within Days)   BMI 24.25 kg/m  , BMI Body mass index is 24.25 kg/m. GEN: Well nourished, well developed, in no acute distress.  HEENT: normal.  Neck: Supple, no JVD, carotid bruits, or masses. Cardiac: RRR, no murmurs, rubs, or gallops. No clubbing, cyanosis, edema.  Radials/DP/PT 2+ and equal bilaterally.  Respiratory:  Respirations regular and unlabored, clear to auscultation bilaterally. GI: Soft, nontender, nondistended, BS + x 4. MS: no deformity or atrophy. Skin: warm and dry, no rash. Neuro:  Strength and sensation are intact. Psych: Normal affect.  Accessory Clinical Findings    ECG - Regular sinus rhythm, 94, prolonged QTc at 485 ms  Assessment & Plan    1.  Inappropriate sinus tachycardia: Overall, patient has done well in the past year. Symptoms are well managed with verapamil therapy and she has not had any recent palpitations. She remains relatively active.  2. Prolonged QTc: QTc measures at 485. I went back and looked at prior ECGs and this is actually a slight improvement over last year. She is on both plaque window and Celexa, which may contribute to QT prolongation. She says  she was only placed on Celexa about 3 months ago. I recommend that even though QT appears to be stable over time, she showed follow-up with primary care to consider an alternate agent.  3. Systemic lupus erythematosus: This is managed with steroids and Plaquenil. As planned from last year, I will arrange for follow-up 2-D echocardiogram to exclude cardiac involvement.  4. Disposition: Follow-up echo. Follow-up with Dr. Kirke CorinArida in one year or sooner if necessary.  Nicolasa Duckinghristopher Saira Kramme, NP 11/12/2016, 3:44 PM

## 2016-11-12 NOTE — Patient Instructions (Addendum)
Medication Instructions:  Please continue your current medications  Labwork: None  Testing/Procedures: Your physician has requested that you have an echocardiogram. Echocardiography is a painless test that uses sound waves to create images of your heart. It provides your doctor with information about the size and shape of your heart and how well your heart's chambers and valves are working. This procedure takes approximately one hour. There are no restrictions for this procedure.   Follow-Up: Your physician wants you to follow-up in: 1 year w/ Dr. Katheren Puller will receive a reminder letter in the mail two months in advance.  If you don't receive a letter, please call our office to schedule the follow-up appointment.  If you need a refill on your cardiac medications before your next appointment, please call your pharmacy.   Echocardiogram An echocardiogram, or echocardiography, uses sound waves (ultrasound) to produce an image of your heart. The echocardiogram is simple, painless, obtained within a short period of time, and offers valuable information to your health care provider. The images from an echocardiogram can provide information such as:  Evidence of coronary artery disease (CAD).  Heart size.  Heart muscle function.  Heart valve function.  Aneurysm detection.  Evidence of a past heart attack.  Fluid buildup around the heart.  Heart muscle thickening.  Assess heart valve function. Tell a health care provider about:  Any allergies you have.  All medicines you are taking, including vitamins, herbs, eye drops, creams, and over-the-counter medicines.  Any problems you or family members have had with anesthetic medicines.  Any blood disorders you have.  Any surgeries you have had.  Any medical conditions you have.  Whether you are pregnant or may be pregnant. What happens before the procedure? No special preparation is needed. Eat and drink normally. What  happens during the procedure?  In order to produce an image of your heart, gel will be applied to your chest and a wand-like tool (transducer) will be moved over your chest. The gel will help transmit the sound waves from the transducer. The sound waves will harmlessly bounce off your heart to allow the heart images to be captured in real-time motion. These images will then be recorded.  You may need an IV to receive a medicine that improves the quality of the pictures. What happens after the procedure? You may return to your normal schedule including diet, activities, and medicines, unless your health care provider tells you otherwise. This information is not intended to replace advice given to you by your health care provider. Make sure you discuss any questions you have with your health care provider. Document Released: 11/19/2000 Document Revised: 07/10/2016 Document Reviewed: 07/30/2013 Elsevier Interactive Patient Education  2017 [employer].

## 2016-11-23 ENCOUNTER — Ambulatory Visit
Admission: RE | Admit: 2016-11-23 | Discharge: 2016-11-23 | Disposition: A | Discharge status: Home / Self Care | Payer: BLUE CROSS/BLUE SHIELD | Source: Ambulatory Visit | Attending: Family Medicine | Admitting: Family Medicine

## 2016-11-23 ENCOUNTER — Encounter: Payer: Self-pay | Admitting: Family Medicine

## 2016-11-23 ENCOUNTER — Ambulatory Visit (INDEPENDENT_AMBULATORY_CARE_PROVIDER_SITE_OTHER): Payer: BLUE CROSS/BLUE SHIELD | Admitting: Family Medicine

## 2016-11-23 VITALS — BP 109/73 | HR 101 | Temp 99.0°F | Resp 16 | Ht 64.0 in | Wt 136.4 lb

## 2016-11-23 DIAGNOSIS — W19XXXA Unspecified fall, initial encounter: Secondary | ICD-10-CM | POA: Diagnosis not present

## 2016-11-23 DIAGNOSIS — G4709 Other insomnia: Secondary | ICD-10-CM | POA: Diagnosis not present

## 2016-11-23 DIAGNOSIS — M25531 Pain in right wrist: Secondary | ICD-10-CM

## 2016-11-23 DIAGNOSIS — M25532 Pain in left wrist: Secondary | ICD-10-CM | POA: Diagnosis not present

## 2016-11-23 MED ORDER — ZOLPIDEM TARTRATE ER 6.25 MG PO TBCR: 6.2500 mg | EXTENDED_RELEASE_TABLET | Freq: Every evening | ORAL | 2 refills | Status: DC | PRN

## 2016-11-23 NOTE — Patient Instructions (Signed)
Thank you for coming in to clinic today.  1. Right wrist x-rays today to rule out fracture, including scaphoid or hand bone fracture than can be a common problem with wrist injuries - If negative x-rays, most likely just sprained wrist from the fall, and in setting of your arthritis, you are more prone to swelling and pain flare up worst with the described activities  Use RICE therapy: - R - Rest / relative rest with activity modification avoid overuse of joint - I - Ice packs (make sure you use a towel or sock / something to protect skin), topical icy hot - C - Compression and support - use thumb spica (whole wrist brace) overnight for next 2-4 weeks, maybe longer - E - Elevation - if significant swelling  Stop Voltaren if not helping  Recommend trial of Anti-inflammatory with Aleve OTC up to 2 tablets - take one with food and plenty of water TWICE daily every day (breakfast and dinner), for next 2 to 4 weeks, then you may take only as needed - DO NOT TAKE any ibuprofen, motrin while you are taking this medicine - It is safe to take Tylenol Ext Str 500mg  tabs - take 1 to 2 (max dose 1000mg ) every 6-8 (3 times daily) hours as needed for breakthrough pain, max 24 hour daily dose is 6 to 8 tablets or 4000mg   Insomnia - try Ambien Continued Release (CR) 6.25mg  nightly  Please schedule a follow-up appointment with Dr. Althea Charon in 4-6 weeks follow-up Right wrist pain / insomnia as needed if not improved  If you have any other questions or concerns, please feel free to call the clinic or send a message through MyChart. You may also schedule an earlier appointment if necessary.  Saralyn Pilar, DO [facility] [place]

## 2016-11-23 NOTE — Assessment & Plan Note (Signed)
Stable but persistent difficulty staying asleep overnight, essentially unchanged off of ambien 5mg  at night PRN. - Still difficulty with sleep maintenance, improved initiating sleep on celexa  Plan: 1. Start trial Ambien CR 6.25mg  nightly PRN, printed rx #30 tabs, +2 refills, initially can take nightly for few nights and then use PRN 2. Continue following sleep hygiene 3. Follow-up 4-6 weeks

## 2016-11-23 NOTE — Assessment & Plan Note (Addendum)
Acute R palmar wrist pain, with some intermittent swelling following new fall injury 2 weeks ago, also in setting of known chronic wrist/hand pain with OA / SLE. No significant weakness on exam, no acute swelling today, no acute bony tenderness less likely fracture, some pain over anatomical snuff box (cannot rule out scaphoid), not consistent with DeQuervain's tenosynovitis or carpal tunnel. Consider possible TFCC strain, if x-rays negative. - No prior history of wrist fracture or surgery - Not responding to conservative therapy (some improved on Aleve), not improved on Voltaren, wrist splint, icy hot  Plan: 1. Check R wrist x-ray series today including scaphoid view 2. Continue OTC Aleve 1-2 tabs BID for 2-4 weeks regular dosing, with Tylenol breakthrough PRN 3. Continue Thumb Spica Wrist splint to avoid wrist flex/ext overnight, and at work with activity 2-4 weeks 4. RICE therapy (rest, ice, compression, elevation) for swelling, activity modification 5. Follow-up 4-6 weeks, if still worsening, consider referral to Ortho for further eval

## 2016-11-23 NOTE — Progress Notes (Signed)
Subjective:    Patient ID: Christina Meza, female    DOB: 1966-06-08, 50 y.o.   MRN: [id number]  Christina Meza is a 50 y.o. female presenting on 11/23/2016 for Wrist Pain (right side pt fell 2 weeks ago)   HPI   Right Wrist Pain, s/p fall - Reports about 2 weeks ago acute injury with R wrist pain and swelling, describes fall she got up overnight and her dog had moved in the bed, and she tripped getting out of bed fell forward braced herself with Right wrist, and hit her head and leg. She had acute pain overall after the fall, wrist pain continued to bother since this injury, and has persisted without improvement or worsening. If works >2 days in a row (works as [employer]) then will have worse pain with most activities including worse with grip motions and opening door / jar, mostly pain with movements not weakness, can get increased swelling and sometimes redness. - Tried OTC Aleve 1-2 tabs BID with improvement, Voltaren cream (previously rx for knee), used it twice a day for 2 weeks without significant relief (mild improvement initially) - Tried wearing wrist support and thumb spica splint with some relief but not significant resolution, wearing at work but not Retail buyereverynight - H/o of chronic hand/thumb arthritis, and some chronic wrist pain, previously had worn wrist support at times without significant relief in past - Admits associated Right hand 4th and 5th finger numbness tingling intermittent, not provoked by anything in particular, swelling in wrist worst at end of day - Taking Vitamin D3 supplement 2,000 daily, no h/o deficiency. Last DEXA 2015 reported normal, without history of osteoporosis - Denies any other joint pain or swelling, prior fracture, weakness  INSOMNIA, Chronic - Last visit with me 09/2016, she had been stable on Celexa 20mg  daily did not want to titrate up to 40mg , and she was still on PRN Ambien 5mg  but not using regularly as it was not helping her stay  asleep - Today reports would like to consider medication for helping her stay asleep longer, last visit we had also discussed Ambien CR. She had previously tolerated ambien well in past, without grogginess or other side effects next day. Currently she can fall asleep easily, but will wake up every few hours and difficulty going back to sleep, ends up sleeping about same amount in total each night, feeling tired and not rested - Continues to follow sleep hygiene, limited caffeine - Denies excessive sleepiness, tired, racing thoughts or anxiety keeping awake   Social History  Substance Use Topics  . Smoking status: Never Smoker  . Smokeless tobacco: Never Used  . Alcohol use 0.0 - 0.6 oz/week    Review of Systems Per HPI unless specifically indicated above     Objective:    BP 109/73   Pulse (!) 101   Temp 99 F (37.2 C) (Oral)   Resp 16   Ht 5\' 4"  (1.626 m)   Wt 136 lb 6.4 oz (61.9 kg)   LMP 07/15/2015 (Within Days)   BMI 23.41 kg/m   Wt Readings from Last 3 Encounters:  11/23/16 136 lb 6.4 oz (61.9 kg)  11/12/16 141 lb 4 oz (64.1 kg)  09/22/16 138 lb 12.8 oz (63 kg)    Physical Exam  Constitutional: She appears well-developed and well-nourished. No distress.  Well-appearing, comfortable, cooperative  Cardiovascular: Intact distal pulses.   Tachycardia  Pulmonary/Chest: Effort normal.  Musculoskeletal: She exhibits no edema.  Right Hand/Wrist Inspection: Bilateral Thumb  CMC joint with bulky appearance (R>L), otherwise hands mostly symmetrical, no bulky MCP joints, no edema or erythema. Palpation: Mild to moderate tender over R palmar wrist carpal bones and in TFCC area (more radial than ulnar), less tender but still point tenderness over R anatomical snuff box scaphoid. Non tender over thumb CMC. Hand and fingers non-tender, forearm non tender, Left side non tender. No tenderness proximally over APL / EPB tendons radially. ROM: full active wrist ROM flex / ext, ulnar /  radial deviation, mild discomfort with wrist flexion/extension Special Testing: Negative Finkelstein's test. Negative Tinel's median and ulnar nerve at elbow. Strength: 5/5 grip, thumb opposition, wrist flex/ext Neurovascular: distally intact  Neurological: She is alert.  Skin: Skin is warm and dry. No rash noted. She is not diaphoretic. No erythema.  Psychiatric: She has a normal mood and affect. Her behavior is normal. Thought content normal.  Nursing note and vitals reviewed.       Assessment & Plan:   Problem List Items Addressed This Visit    Insomnia    Stable but persistent difficulty staying asleep overnight, essentially unchanged off of ambien 5mg  at night PRN. - Still difficulty with sleep maintenance, improved initiating sleep on celexa  Plan: 1. Start trial Ambien CR 6.25mg  nightly PRN, printed rx #30 tabs, +2 refills, initially can take nightly for few nights and then use PRN 2. Continue following sleep hygiene 3. Follow-up 4-6 weeks      Relevant Medications   zolpidem (AMBIEN CR) 6.25 MG CR tablet   Bilateral wrist pain    Chronic problem, now with recent worsening R wrist pain flare from fall, suspect some underlying component of chronic OA vs SLE contributing with her history. - See A&P for Right wrist pain - Follow-up in future with Rheumatology (awaiting new rheum in 01/2017)      Relevant Orders   DG Wrist Complete Right   Acute pain of right wrist - Primary    Acute R palmar wrist pain, with some intermittent swelling following new fall injury 2 weeks ago, also in setting of known chronic wrist/hand pain with OA / SLE. No significant weakness on exam, no acute swelling today, no acute bony tenderness less likely fracture, some pain over anatomical snuff box (cannot rule out scaphoid), not consistent with DeQuervain's tenosynovitis or carpal tunnel. Consider possible TFCC strain, if x-rays negative. - No prior history of wrist fracture or surgery - Not  responding to conservative therapy (some improved on Aleve), not improved on Voltaren, wrist splint, icy hot  Plan: 1. Check R wrist x-ray series today including scaphoid view 2. Continue OTC Aleve 1-2 tabs BID for 2-4 weeks regular dosing, with Tylenol breakthrough PRN 3. Continue Thumb Spica Wrist splint to avoid wrist flex/ext overnight, and at work with activity 2-4 weeks 4. RICE therapy (rest, ice, compression, elevation) for swelling, activity modification 5. Follow-up 4-6 weeks, if still worsening, consider referral to Ortho for further eval      Relevant Orders   DG Wrist Complete Right    Other Visit Diagnoses    Fall, initial encounter       Relevant Orders   DG Wrist Complete Right      Meds ordered this encounter  Medications  . zolpidem (AMBIEN CR) 6.25 MG CR tablet    Sig: Take 1 tablet (6.25 mg total) by mouth at bedtime as needed for sleep.    Dispense:  30 tablet    Refill:  2  Follow up plan: Return in about 4 weeks (around 12/21/2016), or if symptoms worsen or fail to improve, for right wrist pain.  Saralyn Pilar, DO [facility] 11/23/2016, 6:48 PM

## 2016-11-23 NOTE — Assessment & Plan Note (Signed)
Chronic problem, now with recent worsening R wrist pain flare from fall, suspect some underlying component of chronic OA vs SLE contributing with her history. - See A&P for Right wrist pain - Follow-up in future with Rheumatology (awaiting new rheum in 01/2017)

## 2016-11-24 ENCOUNTER — Encounter: Payer: Self-pay | Admitting: Family Medicine

## 2016-11-24 DIAGNOSIS — F419 Anxiety disorder, unspecified: Principal | ICD-10-CM

## 2016-11-24 DIAGNOSIS — F329 Major depressive disorder, single episode, unspecified: Secondary | ICD-10-CM

## 2016-11-24 DIAGNOSIS — F32A Depression, unspecified: Secondary | ICD-10-CM

## 2016-12-02 MED ORDER — DULOXETINE HCL 30 MG PO CPEP: 30.0000 mg | ORAL_CAPSULE | Freq: Every day | ORAL | 5 refills | Status: DC

## 2016-12-07 ENCOUNTER — Encounter: Payer: Self-pay | Admitting: Family Medicine

## 2016-12-07 ENCOUNTER — Ambulatory Visit (INDEPENDENT_AMBULATORY_CARE_PROVIDER_SITE_OTHER): Payer: BLUE CROSS/BLUE SHIELD | Admitting: Family Medicine

## 2016-12-07 VITALS — Temp 98.2°F | Resp 16 | Ht 64.0 in | Wt 135.0 lb

## 2016-12-07 DIAGNOSIS — I951 Orthostatic hypotension: Secondary | ICD-10-CM | POA: Diagnosis not present

## 2016-12-07 DIAGNOSIS — E860 Dehydration: Secondary | ICD-10-CM | POA: Diagnosis not present

## 2016-12-07 DIAGNOSIS — A084 Viral intestinal infection, unspecified: Secondary | ICD-10-CM | POA: Diagnosis not present

## 2016-12-07 DIAGNOSIS — R42 Dizziness and giddiness: Secondary | ICD-10-CM | POA: Diagnosis not present

## 2016-12-07 DIAGNOSIS — R112 Nausea with vomiting, unspecified: Secondary | ICD-10-CM | POA: Diagnosis not present

## 2016-12-07 DIAGNOSIS — R1013 Epigastric pain: Secondary | ICD-10-CM

## 2016-12-07 MED ORDER — METOCLOPRAMIDE HCL 10 MG PO TABS: 10.0000 mg | ORAL_TABLET | Freq: Three times a day (TID) | ORAL | 0 refills | Status: DC | PRN

## 2016-12-07 NOTE — Patient Instructions (Signed)
Thank you for coming in to clinic today.  1. I am not sure the exact cause of your symptoms, most likely is Viral Enteritis or stomach bug still. I think that you definitely have some level of dehydration with poor intake. - Try Reglan 10mg  up to 3 times a day with meals for nausea, to see if this help, caution on taking this longer than few weeks - If not helping switch back to Zofran, may take x 2 tabs per dose for 8mg  per dose as needed - Also these medications can increase your risk of QTc syndrome also, so neither is good option long-term - Try to stay well hydrated, and gradually increase food intake as tolerated - I do not think this is due to Duloxetine - No clear evidence of BPPV or vertigo on my exam today  We will check labs today, go to [facility] to get blood drawn, and we will notify you of results within 24 hours  If dramatically worsening or no improvement, especially with fever/chills, persistent vomiting, abdominal pain discomfort, reduced appetite, may need IV fluids or other therapy, may need to go to Emergency Dept or Urgent Care.  Please schedule a follow-up appointment with Dr. Althea Charon in 1-2 weeks as needed if not improved nausea  If you have any other questions or concerns, please feel free to call the clinic or send a message through MyChart. You may also schedule an earlier appointment if necessary.  Saralyn Pilar, DO [facility], [place]

## 2016-12-07 NOTE — Progress Notes (Signed)
Subjective:    Patient ID: Christina Meza, female    DOB: 12/28/1965, 51 y.o.   MRN: [id number]  Christina Meza is a 51 y.o. female presenting on 12/07/2016 for Nausea (HA nausea on and off sweats onset week was nausea and friday when she first vomited)  Patient presents for a same day appointment.  HPI   Nausea, Vomiting, Dizzy Spells: - Reports onset symptoms about 1 week ago with nausea, vomiting, headache, dizziness and felt motion sickness, did have episodes of sweating. Nausea seems more persistent lasting most days, occasionally she will get relief from nausea, and will be able to eat okay, eating smaller portions, feels like "not digesting food", feels epigastric fullness and some discomfort with mild pain 2-3/10 but not constant. Describes dizziness episodes often worse when standing up, concern for orthostatic, does improve, does not seem to have room spinning but feels some "motion" - Taking Zofran 4mg  ODT every 4 hours, some improvement, has not taken 2 per dose - Sick contacts witht several people at work out sick due to stomach virus - Seems to have mostly normal appetite, but worsens after eating small portions - Recent medication change off of Celexa 20mg  daily to Duloxetine 30mg  daily 12/03/16, however her current symptoms started about 4-5 days prior to this change - Admits chills occasionally - Denies any fever/sweats, diarrhea, hematemesis, heartburn, bloating, burping, abdominal pain, blood in stool, headache, vision changes, near syncope or syncope, chest pain, dyspnea, numbness, weakness, tingling  Social History  Substance Use Topics  . Smoking status: Never Smoker  . Smokeless tobacco: Never Used  . Alcohol use 0.0 - 0.6 oz/week    Review of Systems Per HPI unless specifically indicated above     Objective:    Temp 98.2 F (36.8 C) (Oral)   Resp 16   Ht 5\' 4"  (1.626 m)   Wt 135 lb (61.2 kg)   LMP 07/15/2015 (Within Days)   BMI 23.17 kg/m   Wt  Readings from Last 3 Encounters:  12/07/16 135 lb (61.2 kg)  11/23/16 136 lb 6.4 oz (61.9 kg)  11/12/16 141 lb 4 oz (64.1 kg)    Orthostatic Vital Signs:  Lying 129/69, HR 82 Sitting 105/67, HR 103 Standing 93/66, HR 113 (admits some mild dizziness and lightheadedness on standing)  Physical Exam  Constitutional: She is oriented to person, place, and time. She appears well-developed and well-nourished. No distress.  Tired and mildly ill-appearing but non toxic, mostly comfortable currently, cooperative  HENT:  Head: Normocephalic and atraumatic.  Mouth/Throat: Oropharynx is clear and moist.  Frontal / maxillary sinuses non-tender. Nares patent without purulence or edema. Bilateral TMs clear without erythema, effusion or bulging. Oropharynx clear without erythema, exudates, edema or asymmetry. Seems to have moist mucus mem.  Dix-Hallpike maneuver bilaterally negative without provoked nystagmus or symptoms of vertigo.  Eyes: Conjunctivae are normal. Right eye exhibits no discharge. Left eye exhibits no discharge.  Neck: Normal range of motion. Neck supple. No thyromegaly present.  Cardiovascular: Regular rhythm, normal heart sounds and intact distal pulses.   No murmur heard. Tachycardic  Pulmonary/Chest: Effort normal and breath sounds normal. No respiratory distress. She has no wheezes. She has no rales.  Abdominal: Soft. Bowel sounds are normal. She exhibits no distension and no mass. There is tenderness (Mild discomfort to palpation epigastric only). There is no rebound and no guarding.  Negative McBurneys RLQ. Negative Murphys RUQ.  Musculoskeletal: Normal range of motion. She exhibits no edema or tenderness.  Lymphadenopathy:  She has no cervical adenopathy.  Neurological: She is alert and oriented to person, place, and time.  Skin: Skin is warm and dry. No rash noted. She is not diaphoretic.  Psychiatric: Her behavior is normal.  Nursing note and vitals reviewed.        Assessment & Plan:   Problem List Items Addressed This Visit    None    Visit Diagnoses    Non-intractable vomiting with nausea, unspecified vomiting type    -  Primary   Relevant Medications   metoCLOPramide (REGLAN) 10 MG tablet   Other Relevant Orders   Comprehensive metabolic panel   Viral enteritis       Episode of dizziness       Relevant Orders   Comprehensive metabolic panel   Epigastric abdominal pain       Relevant Orders   CBC with Differential/Platelet   Dehydration, mild       Relevant Orders   Comprehensive metabolic panel      Constellation of symptoms most suggestive of viral enteritis with potential sick contacts (without any diarrhea) x 1 week with some associated likely mild dehydration (positive orthostatic vitals today) and some reduced appetite, some dizziness seems more likely related to hydration/illness, not entirely consistent with BPPV (negative Dix-hallpike today), no focal neurological findings today. - Abdominal exam is benign today only mild epigastric discomfort, but no distention, no rebound, no RLQ pain, differential considered acute appendicitis, seems less likely given lack of abdominal pain, however cannot rule out.  Plan: 1. Check labs (given printed labs to [facility]) Chemistry, CBC (WBC) - follow-up 2. Reassurance, likely self-limited 7-10 days if more viral gastroenteritis 3. Increase hydration, starting with small sips clear fluids (gatorade, pedialyate, water), improve regular PO as well 4. Trial on Reglan 10mg  q 8 hr PRN nausea/vomiting, requested by patient, she had never taken before, may stop and return to zofran if needed can increase to 8mg  q 8 hr PRN. Cautioned on prolong QTc and side effects of these medications 5. Close follow-up within 1-2 weeks if gradually resolving and suspect more viral with negative labs, however if abnormal labs or worsening symptoms urged closer follow-up, may seek more immediate treatment at ED or Urgent Care  if concern for worsening hydration, may need IVF and other testing, imaging   Meds ordered this encounter  Medications  . metoCLOPramide (REGLAN) 10 MG tablet    Sig: Take 1 tablet (10 mg total) by mouth every 8 (eight) hours as needed for nausea. Take with food    Dispense:  30 tablet    Refill:  0      Follow up plan: Return in about 2 weeks (around 12/21/2016), or if symptoms worsen or fail to improve, for nausea, dizziness.  Saralyn Pilar, DO [facility] 12/07/2016, 5:17 PM

## 2016-12-08 LAB — CBC WITH DIFFERENTIAL/PLATELET
BASOS ABS: 0 10*3/uL (ref 0.0–0.2)
Basos: 0 %
EOS (ABSOLUTE): 0 10*3/uL (ref 0.0–0.4)
Eos: 0 %
Hematocrit: 40.1 % (ref 34.0–46.6)
Hemoglobin: 13.3 g/dL (ref 11.1–15.9)
IMMATURE GRANS (ABS): 0 10*3/uL (ref 0.0–0.1)
IMMATURE GRANULOCYTES: 0 %
LYMPHS: 24 %
Lymphocytes Absolute: 0.8 10*3/uL (ref 0.7–3.1)
MCH: 30.2 pg (ref 26.6–33.0)
MCHC: 33.2 g/dL (ref 31.5–35.7)
MCV: 91 fL (ref 79–97)
MONOS ABS: 0.4 10*3/uL (ref 0.1–0.9)
Monocytes: 11 %
NEUTROS PCT: 65 %
Neutrophils Absolute: 2.1 10*3/uL (ref 1.4–7.0)
PLATELETS: 204 10*3/uL (ref 150–379)
RBC: 4.41 x10E6/uL (ref 3.77–5.28)
RDW: 14.2 % (ref 12.3–15.4)
WBC: 3.2 10*3/uL — ABNORMAL LOW (ref 3.4–10.8)

## 2016-12-08 LAB — COMPREHENSIVE METABOLIC PANEL
A/G RATIO: 1.2 (ref 1.2–2.2)
ALT: 14 IU/L (ref 0–32)
AST: 20 IU/L (ref 0–40)
Albumin: 3.9 g/dL (ref 3.5–5.5)
Alkaline Phosphatase: 85 IU/L (ref 39–117)
BUN/Creatinine Ratio: 19 (ref 9–23)
BUN: 14 mg/dL (ref 6–24)
Bilirubin Total: 0.4 mg/dL (ref 0.0–1.2)
CALCIUM: 9.2 mg/dL (ref 8.7–10.2)
CO2: 24 mmol/L (ref 18–29)
CREATININE: 0.74 mg/dL (ref 0.57–1.00)
Chloride: 100 mmol/L (ref 96–106)
GFR calc Af Amer: 109 mL/min/{1.73_m2} (ref >59)
GFR, EST NON AFRICAN AMERICAN: 95 mL/min/{1.73_m2} (ref >59)
GLUCOSE: 90 mg/dL (ref 65–99)
Globulin, Total: 3.2 g/dL (ref 1.5–4.5)
POTASSIUM: 4.4 mmol/L (ref 3.5–5.2)
Sodium: 140 mmol/L (ref 134–144)
TOTAL PROTEIN: 7.1 g/dL (ref 6.0–8.5)

## 2016-12-09 ENCOUNTER — Other Ambulatory Visit: Payer: BLUE CROSS/BLUE SHIELD

## 2016-12-17 ENCOUNTER — Other Ambulatory Visit: Payer: Self-pay

## 2016-12-17 ENCOUNTER — Ambulatory Visit (INDEPENDENT_AMBULATORY_CARE_PROVIDER_SITE_OTHER): Payer: BLUE CROSS/BLUE SHIELD

## 2016-12-17 DIAGNOSIS — R000 Tachycardia, unspecified: Secondary | ICD-10-CM | POA: Diagnosis not present

## 2016-12-28 ENCOUNTER — Telehealth: Payer: Self-pay | Admitting: Family Medicine

## 2016-12-28 DIAGNOSIS — F329 Major depressive disorder, single episode, unspecified: Secondary | ICD-10-CM

## 2016-12-28 DIAGNOSIS — F419 Anxiety disorder, unspecified: Principal | ICD-10-CM

## 2016-12-28 DIAGNOSIS — F32A Depression, unspecified: Secondary | ICD-10-CM

## 2016-12-28 MED ORDER — DULOXETINE HCL 30 MG PO CPEP: 30.0000 mg | ORAL_CAPSULE | Freq: Every day | ORAL | 3 refills | Status: DC

## 2016-12-28 NOTE — Telephone Encounter (Signed)
Changed rx and sent e-script to [facility] mail order for Duloxetine (Cymbalta) 30mg  daily #90 for 90 day supply +3 refills.  Saralyn Pilar, DO [facility] 12/28/2016, 11:45 AM

## 2016-12-28 NOTE — Telephone Encounter (Signed)
Pt said cymbalta needs to be a 90 day supply for insurance to pay.  Pt's call back number is [phone]

## 2017-01-06 ENCOUNTER — Encounter: Payer: Self-pay | Admitting: Family Medicine

## 2017-01-06 NOTE — Progress Notes (Signed)
FMLA Form Completion: Received FMLA paperwork for patient on 01/06/17 for renewal (initial request for this provider).  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 Approximate date of onset for condition requesting leave - 12/30/16 2. Probable duration of patient's condition: Unknown. Condition has exacerbations & remission 3. Dates you have treated patient: 08/10/16, 09/22/16, 11/23/16, 12/07/16  Part B: Serious Health Condition: 1. Inpatient Care - No 2. Pregnancy - No 3. Incapacity - No        b) Yes - requires 1 office visit resulting in a regimen of continuing treatment (rx medication)        c) Date of first treatment for condition: 06/1994        d) Date of most recent treatment for condition: 09/14/16 (by Specialist)        e) Date of next scheduled apt: 01/24/17         f) Prescribed medication for condition other than OTC? - Yes        G) Referred to othe rhealth care providers for eval / treatment? - Yes, Dr Morene Rankins (Rheumatology, 01/24/17), Dr Kirke Corin (Cardiology, 10/13/16) 4. Chronic Condition     - Yes (requires at least 2 visits per year for treatment - SLE Arthritis, non-specific Sinus Tachycardia (ST) 5. Permanent Long Term Condition - Yes 6. Conditions Requiring Multiple Treatments - No (continuous absence from multiple therapies)  Part C: Amount and Type of Leave Needed 1. Continuous Leave - No 2. Reduced Schedule - No 3. Intermittent Leave - Yes       - Start Date / End Date - n/a       - Estimated Frequency - #5-6 per year       - Duration each episode - #3-4 days  Reduced Schedule or Intermittent Leave Only - Yes (medically necessary) - due to SLE periods of exacerbations, remissions, arthritis periods of swollen painful joints limited ability to work  Completed, signed, and dated FMLA paperwork 01/06/17. To be scanned into chart and submitted via fax.  Saralyn Pilar, DO [facility] 01/06/2017, 2:27 PM

## 2017-01-18 ENCOUNTER — Other Ambulatory Visit: Payer: Self-pay | Admitting: Family Medicine

## 2017-01-18 DIAGNOSIS — Z1231 Encounter for screening mammogram for malignant neoplasm of breast: Secondary | ICD-10-CM

## 2017-02-09 ENCOUNTER — Ambulatory Visit
Admission: RE | Admit: 2017-02-09 | Discharge: 2017-02-09 | Disposition: A | Discharge status: Home / Self Care | Payer: BLUE CROSS/BLUE SHIELD | Source: Ambulatory Visit | Attending: Family Medicine | Admitting: Family Medicine

## 2017-02-09 DIAGNOSIS — Z1231 Encounter for screening mammogram for malignant neoplasm of breast: Secondary | ICD-10-CM | POA: Insufficient documentation

## 2017-02-09 DIAGNOSIS — R928 Other abnormal and inconclusive findings on diagnostic imaging of breast: Secondary | ICD-10-CM | POA: Insufficient documentation

## 2017-02-10 ENCOUNTER — Other Ambulatory Visit: Payer: Self-pay | Admitting: Family Medicine

## 2017-02-10 DIAGNOSIS — N6320 Unspecified lump in the left breast, unspecified quadrant: Secondary | ICD-10-CM

## 2017-02-10 DIAGNOSIS — R928 Other abnormal and inconclusive findings on diagnostic imaging of breast: Secondary | ICD-10-CM

## 2017-02-10 DIAGNOSIS — N6489 Other specified disorders of breast: Secondary | ICD-10-CM

## 2017-02-15 ENCOUNTER — Telehealth: Payer: Self-pay | Admitting: *Deleted

## 2017-02-15 NOTE — Telephone Encounter (Signed)
Contacted patient to make sure she is aware additional views are needed for abnormal mammogram.  Patient states she is aware but has not scheduled yet.Gregory

## 2017-02-22 ENCOUNTER — Ambulatory Visit
Admission: RE | Admit: 2017-02-22 | Discharge: 2017-02-22 | Disposition: A | Discharge status: Home / Self Care | Payer: BLUE CROSS/BLUE SHIELD | Source: Ambulatory Visit | Attending: Family Medicine | Admitting: Family Medicine

## 2017-02-22 DIAGNOSIS — N6489 Other specified disorders of breast: Secondary | ICD-10-CM | POA: Insufficient documentation

## 2017-02-22 DIAGNOSIS — R928 Other abnormal and inconclusive findings on diagnostic imaging of breast: Secondary | ICD-10-CM

## 2017-02-22 DIAGNOSIS — N6321 Unspecified lump in the left breast, upper outer quadrant: Secondary | ICD-10-CM | POA: Insufficient documentation

## 2017-02-22 DIAGNOSIS — N6320 Unspecified lump in the left breast, unspecified quadrant: Secondary | ICD-10-CM

## 2017-02-23 ENCOUNTER — Other Ambulatory Visit: Payer: Self-pay

## 2017-02-23 ENCOUNTER — Other Ambulatory Visit: Payer: Self-pay | Admitting: Family Medicine

## 2017-02-23 ENCOUNTER — Telehealth: Payer: Self-pay | Admitting: Family Medicine

## 2017-02-23 DIAGNOSIS — N6310 Unspecified lump in the right breast, unspecified quadrant: Secondary | ICD-10-CM | POA: Insufficient documentation

## 2017-02-23 DIAGNOSIS — R928 Other abnormal and inconclusive findings on diagnostic imaging of breast: Secondary | ICD-10-CM

## 2017-02-23 NOTE — Telephone Encounter (Signed)
Pt received a call from Dr. Kirtland Bouchard about her [place] results and she asked for a phone call to discuss answer [phone]

## 2017-03-01 ENCOUNTER — Ambulatory Visit
Admission: RE | Admit: 2017-03-01 | Discharge: 2017-03-01 | Disposition: A | Discharge status: Home / Self Care | Payer: BLUE CROSS/BLUE SHIELD | Source: Ambulatory Visit | Attending: Family Medicine | Admitting: Family Medicine

## 2017-03-01 DIAGNOSIS — R920 Mammographic microcalcification found on diagnostic imaging of breast: Secondary | ICD-10-CM | POA: Diagnosis not present

## 2017-03-01 DIAGNOSIS — N6021 Fibroadenosis of right breast: Secondary | ICD-10-CM | POA: Insufficient documentation

## 2017-03-01 DIAGNOSIS — R928 Other abnormal and inconclusive findings on diagnostic imaging of breast: Secondary | ICD-10-CM | POA: Diagnosis present

## 2017-03-01 HISTORY — PX: BREAST BIOPSY: SHX20

## 2017-03-02 LAB — SURGICAL PATHOLOGY

## 2017-03-06 ENCOUNTER — Other Ambulatory Visit: Payer: Self-pay | Admitting: Family Medicine

## 2017-03-09 ENCOUNTER — Other Ambulatory Visit: Payer: Self-pay | Admitting: Family Medicine

## 2017-03-16 ENCOUNTER — Encounter: Payer: Self-pay | Admitting: Family Medicine

## 2017-03-16 ENCOUNTER — Ambulatory Visit (INDEPENDENT_AMBULATORY_CARE_PROVIDER_SITE_OTHER): Payer: BLUE CROSS/BLUE SHIELD | Admitting: Family Medicine

## 2017-03-16 VITALS — BP 111/67 | HR 103 | Temp 98.5°F | Resp 16 | Ht 64.0 in | Wt 139.0 lb

## 2017-03-16 DIAGNOSIS — L299 Pruritus, unspecified: Secondary | ICD-10-CM | POA: Diagnosis not present

## 2017-03-16 DIAGNOSIS — R21 Rash and other nonspecific skin eruption: Secondary | ICD-10-CM

## 2017-03-16 DIAGNOSIS — M328 Other forms of systemic lupus erythematosus: Secondary | ICD-10-CM | POA: Diagnosis not present

## 2017-03-16 MED ORDER — TRIAMCINOLONE ACETONIDE 0.5 % EX CREA: 1.0000 "application " | TOPICAL_CREAM | Freq: Two times a day (BID) | CUTANEOUS | 1 refills | Status: DC

## 2017-03-16 NOTE — Telephone Encounter (Signed)
Reviewed chart. This phone message was not routed to my inbox back on 02/23/17. Patient had breast biopsy and results negative. She was given all results that were released through MyChart and per [facility].  Patient scheduled today 03/16/17 for office visit, will answer any other questions.  Saralyn Pilar, DO [facility] 03/16/2017, 10:20 AM

## 2017-03-16 NOTE — Patient Instructions (Signed)
Thank you for coming in to clinic today.   1. Not exactly sure cause of this rash. We will cover with topical steroid for symptoms and if it is related to any type of inflammation or dermatitis then it should gradually improve  Referral to Dermatology  [facility] [address] [place], [place] [address] Phone: [phone]  Arin L. Roseanne Kaufman, MD ? Pilar Plate, MD  Can get worse due to a variety of factors (excessive dry skin from bathing/showering, soaps, cold weather / indoor heaters, outdoor exposures).  Use the topical steroid creams twice a day for up to 1 week, maximum duration of use per one flare is 10 to 14 days, then STOP using it and allow skin to recover. Caution with over-use may cause lightening of the skin.   Hydrocortisone on face only and the Triamcinolone / Kenalog on body only.  Tips to reduce Eczema Flares: For baths/showers, limit bathing to every other day if you can (max 1 x daily)  Use a gentle, unscented soap and lukewarm water (hot water is most irritating to skin) Never scrub skin with too much pressure, this causes more irritation. Pat skin dry, then leave it slightly damp. DO NOT scrub it dry. Apply steroid cream to skin and rub in all the way, wait 15 min, then apply a daily moisturizer (Vaseline, Eucerin, Aveeno). Continue daily moisturizer every day of the year (even after flare is resolved) - If you have eczema on hands or dry hands, recommend wearing any type of gloves overnight (cloth, fabric, or even nitrile/latex) to improve effect of topical moisturizer  If develops redness, honey colored crust oozing, drainage of pus, bleeding, or redness / swelling, pain, please return for re-evaluation, may have become infected after scratching.  If needed we can increase Prednisone for a 1 week burst to see if this controls if related to Lupus, also may discuss this with your Rheumatologist  Please schedule a follow-up appointment with Dr. Althea Charon  within 3 months for Annual Physical - call office first for fasting labs about 1-2 weeks before.  If you have any other questions or concerns, please feel free to call the clinic or send a message through MyChart. You may also schedule an earlier appointment if necessary.  Saralyn Pilar, DO [facility], [place]

## 2017-03-16 NOTE — Assessment & Plan Note (Signed)
Unlikely etiology for this localized rash on forearm and chest, however etiology remains unclear, may be medication side effect if not responding to conventional treatment. - Followed by [facility] Rheum Dr Morene Rankins - On Benlysta, Prednisone, Plaquenil

## 2017-03-16 NOTE — Progress Notes (Signed)
Subjective:    Patient ID: Christina Meza, female    DOB: 1966-08-14, 51 y.o.   MRN: [id number]  Christina Meza is a 51 y.o. female presenting on 03/16/2017 for Rash (rash on Left arm and neck area onset 3 weeks)   HPI   RASH: Reports symptoms started about 3 weeks ago on front middle chest and left forearm, red raised, itchy, mildly tender as well, did have some very small clear fluid containing vesicles. Overall without significant improvement in 3 weeks. She has not seen a Dermatologist, she tried to get in but was told would take 6 months. - Requesting referral today - No new recent exposures topical, detergents, no new medications. No prior similar rash before. - Still using topical hydrocortisone as needed for itching with some mild relief - Admits some discomfort at times due to rash but no radiating pain or other significant associated symptoms - Admits stable generalized joint pains from SLE, see below - Denies nausea, vomiting, chest pain or pressure  SLE - Followed by Rheumatology [facility]) Dr Morene Rankins - last seen 01/24/17, was told that her SLE is currently active based on recent lab tests, she was started on Benlysta subq injections, stopped on MTX due to abnormal lab results in past. Continues on Plaquenil 200 BID, and Prednisone 7mg  daily - Next visit with Rheumatology is June 2018   Social History  Substance Use Topics  . Smoking status: Never Smoker  . Smokeless tobacco: Never Used  . Alcohol use 0.0 - 0.6 oz/week    Review of Systems Per HPI unless specifically indicated above     Objective:    BP 111/67   Pulse (!) 103   Temp 98.5 F (36.9 C)   Resp 16   Ht 5\' 4"  (1.626 m)   Wt 139 lb (63 kg)   LMP 07/15/2015 (Within Days)   BMI 23.86 kg/m   Wt Readings from Last 3 Encounters:  03/16/17 139 lb (63 kg)  12/07/16 135 lb (61.2 kg)  11/23/16 136 lb 6.4 oz (61.9 kg)    Physical Exam  Constitutional: She appears well-developed and well-nourished. No  distress.  Well-appearing, comfortable, cooperative  HENT:  Head: Normocephalic and atraumatic.  Mouth/Throat: Oropharynx is clear and moist.  Eyes: Conjunctivae are normal.  Cardiovascular:  Mild tachycardic  Pulmonary/Chest: Effort normal.  Skin: Skin is warm and dry. Rash (Left forearm and central sternal chest, fairly non descript relatively flat minimal raised localized erythematous macular rash with some areas of dry skin with flaking. No vesicles or bulla. No ulceration. No induration.) noted. She is not diaphoretic.  Nursing note and vitals reviewed.           Assessment & Plan:   Problem List Items Addressed This Visit    SLE (systemic lupus erythematosus) (HCC)    Unlikely etiology for this localized rash on forearm and chest, however etiology remains unclear, may be medication side effect if not responding to conventional treatment. - Followed by [facility] Rheum Dr Morene Rankins - On Benlysta, Prednisone, Plaquenil       Other Visit Diagnoses    Rash and nonspecific skin eruption    -  Primary  Unclear exact etiology, does have some relatively benign appearance, non descript rash. Some pruritic features may be more atopic / dermatitis. Alternatively could be related to medication side effect vs SLE.  Plan: 1. Increase potency of topical steroid to Triamcinolone 0.5mg  topical cream BID for 2 weeks, may repeat as needed 2. Referral to [facility]  Dermatology as requested for second opinion if not resolving 3. Also offered future may need burst of Prednisone for up to 1 week taper back down to daily 7mg  daily if this is actually related to SLE or other cause. May follow-up with her Rheumatologist as well.    Relevant Medications   triamcinolone cream (KENALOG) 0.5 %   Other Relevant Orders   Ambulatory referral to Dermatology   Pruritus       Relevant Medications   triamcinolone cream (KENALOG) 0.5 %      Meds ordered this encounter                  . triamcinolone cream  (KENALOG) 0.5 %    Sig: Apply 1 application topically 2 (two) times daily. To affected areas, for up to 2 weeks.    Dispense:  30 g    Refill:  1      Follow up plan: Return as needed if not improved rash.  Return in about 3 months (around 06/15/2017) for Annual Physical - call office first for fasting labs about 1-2 weeks before.  Saralyn Pilar, DO [facility] 03/16/2017, 5:28 PM

## 2017-04-27 ENCOUNTER — Other Ambulatory Visit: Payer: Self-pay | Admitting: Family Medicine

## 2017-04-27 DIAGNOSIS — Z7952 Long term (current) use of systemic steroids: Secondary | ICD-10-CM

## 2017-04-27 DIAGNOSIS — Z79899 Other long term (current) drug therapy: Secondary | ICD-10-CM

## 2017-04-27 DIAGNOSIS — E559 Vitamin D deficiency, unspecified: Secondary | ICD-10-CM

## 2017-04-27 DIAGNOSIS — M328 Other forms of systemic lupus erythematosus: Secondary | ICD-10-CM

## 2017-04-27 DIAGNOSIS — Z0000 Encounter for general adult medical examination without abnormal findings: Secondary | ICD-10-CM

## 2017-04-27 DIAGNOSIS — E782 Mixed hyperlipidemia: Secondary | ICD-10-CM

## 2017-04-27 DIAGNOSIS — R7309 Other abnormal glucose: Secondary | ICD-10-CM

## 2017-05-04 ENCOUNTER — Other Ambulatory Visit: Payer: BLUE CROSS/BLUE SHIELD

## 2017-05-04 DIAGNOSIS — Z7952 Long term (current) use of systemic steroids: Secondary | ICD-10-CM

## 2017-05-04 DIAGNOSIS — R7309 Other abnormal glucose: Secondary | ICD-10-CM

## 2017-05-04 DIAGNOSIS — Z79899 Other long term (current) drug therapy: Secondary | ICD-10-CM

## 2017-05-04 DIAGNOSIS — E559 Vitamin D deficiency, unspecified: Secondary | ICD-10-CM

## 2017-05-04 DIAGNOSIS — Z0000 Encounter for general adult medical examination without abnormal findings: Secondary | ICD-10-CM

## 2017-05-04 DIAGNOSIS — M328 Other forms of systemic lupus erythematosus: Secondary | ICD-10-CM

## 2017-05-04 DIAGNOSIS — E782 Mixed hyperlipidemia: Secondary | ICD-10-CM

## 2017-05-04 LAB — LIPID PANEL
CHOL/HDL RATIO: 3.7 ratio (ref <5.0)
Cholesterol: 172 mg/dL (ref <200)
HDL: 46 mg/dL — ABNORMAL LOW (ref >50)
LDL Cholesterol: 93 mg/dL (ref <100)
Triglycerides: 163 mg/dL — ABNORMAL HIGH (ref <150)
VLDL: 33 mg/dL — AB (ref <30)

## 2017-05-04 LAB — BASIC METABOLIC PANEL WITH GFR
BUN: 16 mg/dL (ref 7–25)
CHLORIDE: 106 mmol/L (ref 98–110)
CO2: 24 mmol/L (ref 20–31)
CREATININE: 0.89 mg/dL (ref 0.50–1.05)
Calcium: 8.9 mg/dL (ref 8.6–10.4)
GFR, Est African American: 87 mL/min (ref >=60)
GFR, Est Non African American: 76 mL/min (ref >=60)
GLUCOSE: 75 mg/dL (ref 65–99)
POTASSIUM: 3.9 mmol/L (ref 3.5–5.3)
Sodium: 142 mmol/L (ref 135–146)

## 2017-05-04 LAB — TSH: TSH: 3.06 mIU/L

## 2017-05-05 LAB — HEMOGLOBIN A1C
Hgb A1c MFr Bld: 5.4 % (ref <5.7)
MEAN PLASMA GLUCOSE: 108 mg/dL

## 2017-05-05 LAB — VITAMIN D 25 HYDROXY (VIT D DEFICIENCY, FRACTURES): VIT D 25 HYDROXY: 42 ng/mL (ref 30–100)

## 2017-05-06 ENCOUNTER — Encounter: Payer: Self-pay | Admitting: Family Medicine

## 2017-05-06 ENCOUNTER — Ambulatory Visit (INDEPENDENT_AMBULATORY_CARE_PROVIDER_SITE_OTHER): Payer: BLUE CROSS/BLUE SHIELD | Admitting: Family Medicine

## 2017-05-06 VITALS — BP 110/70 | HR 106 | Temp 98.8°F | Resp 16 | Ht 64.0 in | Wt 137.0 lb

## 2017-05-06 DIAGNOSIS — J029 Acute pharyngitis, unspecified: Secondary | ICD-10-CM

## 2017-05-06 DIAGNOSIS — H6121 Impacted cerumen, right ear: Secondary | ICD-10-CM | POA: Diagnosis not present

## 2017-05-06 DIAGNOSIS — F329 Major depressive disorder, single episode, unspecified: Secondary | ICD-10-CM | POA: Diagnosis not present

## 2017-05-06 DIAGNOSIS — F32A Depression, unspecified: Secondary | ICD-10-CM

## 2017-05-06 DIAGNOSIS — J3089 Other allergic rhinitis: Secondary | ICD-10-CM

## 2017-05-06 DIAGNOSIS — G4709 Other insomnia: Secondary | ICD-10-CM

## 2017-05-06 DIAGNOSIS — Z0001 Encounter for general adult medical examination with abnormal findings: Secondary | ICD-10-CM | POA: Diagnosis not present

## 2017-05-06 DIAGNOSIS — F419 Anxiety disorder, unspecified: Secondary | ICD-10-CM

## 2017-05-06 DIAGNOSIS — E782 Mixed hyperlipidemia: Secondary | ICD-10-CM | POA: Diagnosis not present

## 2017-05-06 DIAGNOSIS — M328 Other forms of systemic lupus erythematosus: Secondary | ICD-10-CM | POA: Diagnosis not present

## 2017-05-06 DIAGNOSIS — Z0000 Encounter for general adult medical examination without abnormal findings: Secondary | ICD-10-CM

## 2017-05-06 MED ORDER — BUSPIRONE HCL 5 MG PO TABS: 5.0000 mg | ORAL_TABLET | Freq: Two times a day (BID) | ORAL | 5 refills | Status: DC

## 2017-05-06 MED ORDER — FLUTICASONE PROPIONATE 50 MCG/ACT NA SUSP: 2.0000 | Freq: Every day | NASAL | 3 refills | Status: DC

## 2017-05-06 NOTE — Progress Notes (Signed)
Subjective:    Patient ID: Christina Meza, female    DOB: 1966-11-27, 51 y.o.   MRN: [id number]  Christina Meza is a 51 y.o. female presenting on 05/06/2017 for Annual Exam   HPI   HYPERLIPIDEMIA / History of Elevated Glucose on Prednisone - Reports no concerns. Last lipid panel 05/04/17, controlled except mild abnormal TG - Currently not on statin - Has tried Fish Oil in past with some indigestion, but willing to try different formulation now - Last A1c 5.4, normal range, has no concerns about prior history of Pre-DM - Lifestyle: - Diet: no significant dietary changes, tries to eat healthy, lower carb - Exercise: Walking 45 min daily in 15 min intervals  Anxiety / Depression / Insomnia: - Previously discussed in detail on 09/22/16, see note for background information. Chronic anxiety with some depressive symptoms and related insomnia, due to mostly adjustment disorder with life stressors, changes with ill family members, acting as caregiver and living with parents. Prior med trials on Paxil, Sertraline, Celexa most recently, then was switched to Cymbalta due to concerns with prolonged QTc and also for some better chronic pain control in setting of SLE.  - Today reports no significant improvement in anxiety/mood on Cymbalta, does not seem better or worse than Celexa, hard to tell difference - She does endorse no longer needing to take 2 aleve nightly for past several weeks, unsure if this is due to SLE medication from Rheum or if Cymbalta is helping - Regarding insomnia, had tried Ambien CR 6.80m nightly for several weeks did cause some afternoon "lull" or grogginess after taking Ambien. Recently she has been off of this completely and doing well, tried drinking Sleepytime Tea, turning TV off and going to bed with good improve. Has enough Ambien left and 1 refill for now  SLE  - Followed by Rheumatology ([facility] Dr MGolda Acre had been started on Benlysta subq injections and stopped on MTX.  Continues on Plaquenil and Prednisone 7100mdaily, next visit June 2018 - She reports thinks pain is overall better on this treatment. Now no longer has joint or muscle pain generalized, mostly localized to wrists and hands but overall improved - No longer taking 2 Aleve nightly  R Ear Ache / Sore Throat: - Reports symptoms started 3-4 days ago with sore throat after waking up and then worsening to R ear ache. - Not tried any OTC medicines, except cough drops - No known sick contacts. - Denies fevers/chills, sweats, cough, congestion, nausea, vomiting, sinus pain or pressure, headache  FOLLOW-UP RASH: Last visit for this problem 03/16/17, see note for initial history, she was given rx stronger Triamcinolone 0.5% topical steroid, and referral to Dermatology. - Today reports rash is mostly unchanged involves both lower forearms and chest still, intermittent improve and worse. Has not been able to schedule with Dermatology yet. Using topical steroid as needed with improved itching  Health Maintenance: - [facility] she plans to repeat colonoscopy, thought to be IBS vs diarrhea secondary to medications, no significant polyps, she plans to follow-up with GI later this year  [facility] 05/06/2017 09/22/2016 08/10/2016  Decreased Interest 0 0 1  Down, Depressed, Hopeless 0 1 1  PHQ - 2 Score 0 1 2  Altered sleeping '2 3 3  ' Tired, decreased energy '3 3 1  ' Change in appetite 1 1 0  Feeling bad or failure about yourself  0 0 0  Trouble concentrating 0 1 1  Moving  slowly or fidgety/restless 1 0 0  Suicidal thoughts 0 0 0  PHQ-9 Score '7 9 7  ' Difficult doing work/chores - Not difficult at all Not difficult at all   GAD 7 : Generalized Anxiety Score 05/06/2017  Nervous, Anxious, on Edge 3  Control/stop worrying 1  Worry too much - different things 3  Trouble relaxing 3  Restless 1  Easily annoyed or irritable 3  Afraid - awful might happen 0  Total GAD 7  Score 14  Anxiety Difficulty Somewhat difficult     Past Medical History:  Diagnosis Date  . Allergy   . Arthritis   . Asthma   . C. difficile diarrhea 4010,2725  . Clostridium difficile infection    2015x2 , 2016x1  . Depression   . Genital warts   . GERD (gastroesophageal reflux disease)   . Inappropriate sinus tachycardia    a. 2010 Nuc Stress test: no ischemia/infarct;  b. 10/2013 Echo: Nl LV fxn, mild MR/TR.  . Migraine   . Palpitations   . Prolonged QT interval   . SLE (systemic lupus erythematosus) ([place])   . Stomach ulcer    Past Surgical History:  Procedure Laterality Date  . BREAST CYST ASPIRATION Left 4-5 yrs ago   NEG  . CATARACT EXTRACTION  2008  . ENDOSCOPIC VEIN LASER TREATMENT    . UMBILICAL HERNIA REPAIR    . vulvar excision for HPV  07/04/2014   Social History   Social History  . Marital status: Married    Spouse name: N/A  . Number of children: N/A  . Years of education: N/A   Occupational History  . Not on file.   Social History Main Topics  . Smoking status: Never Smoker  . Smokeless tobacco: Never Used  . Alcohol use 0.0 - 0.6 oz/week  . Drug use: No  . Sexual activity: Yes    Partners: Male   Other Topics Concern  . 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      Family History  Problem Relation Age of Onset  . Arthritis Mother   . Bipolar disorder Mother   . Depression Mother   . Mental illness Mother   . Hypertension Maternal Grandmother   . Hyperlipidemia Maternal Grandfather   . Heart disease Maternal Grandfather   . Stroke Maternal Grandfather   . Hypertension Maternal Grandfather   . Colon cancer Paternal Grandfather   . Breast cancer Neg Hx    Current Outpatient Prescriptions on File Prior to Visit  Medication Sig  . acetaminophen (TYLENOL) 650 MG suppository Place 650 mg rectally every 4 (four) hours as needed.  . Belimumab 200 MG/ML SOAJ Inject 200 mg into the skin.  . Biotin 5000 MCG TABS Take by  mouth.  . Cholecalciferol (VITAMIN D3) 3000 units TABS Take by mouth daily.  . diclofenac sodium (VOLTAREN) 1 % GEL Apply 1 application topically as needed.  . DULoxetine (CYMBALTA) 30 MG capsule Take 1 capsule (30 mg total) by mouth daily.  . hydroxychloroquine (PLAQUENIL) 200 MG tablet Take 200 mg by mouth 2 (two) times daily.   Marland Kitchen levalbuterol (XOPENEX HFA) 45 MCG/ACT inhaler Inhale 1 puff into the lungs every 4 (four) hours as needed for wheezing. Reported on 03/14/2016  . metronidazole (NORITATE) 1 % cream Apply topically daily.  . mometasone (ASMANEX) 220 MCG/INH inhaler Inhale 2 puffs into the lungs daily.  . Multiple Vitamins-Minerals (MULTIVITAMIN WITH MINERALS) tablet Take 1 tablet by mouth  every morning.   . naproxen sodium (ALEVE) 220 MG tablet Take 1 tablet (220 mg total) by mouth at bedtime.  . ondansetron (ZOFRAN) 4 MG tablet Take 1 tablet (4 mg total) by mouth every 8 (eight) hours as needed for nausea or vomiting.  . pantoprazole (PROTONIX) 40 MG tablet Take 1 tablet (40 mg total) by mouth daily.  . predniSONE (DELTASONE) 5 MG tablet Take 7 mg by mouth daily with breakfast.   . Probiotic Product (PROBIOTIC DAILY PO) Take 1 tablet by mouth daily.   Marland Kitchen triamcinolone cream (KENALOG) 0.5 % Apply 1 application topically 2 (two) times daily. To affected areas, for up to 2 weeks.  . valACYclovir (VALTREX) 500 MG tablet Take 500 mg by mouth at bedtime.  . verapamil (CALAN-SR) 120 MG CR tablet Take 1 tablet (120 mg total) by mouth at bedtime.  . vitamin E 400 UNIT capsule Take 400 Units by mouth daily.  Marland Kitchen zolpidem (AMBIEN CR) 6.25 MG CR tablet Take 1 tablet (6.25 mg total) by mouth at bedtime as needed for sleep.  . metoCLOPramide (REGLAN) 10 MG tablet Take 1 tablet (10 mg total) by mouth every 8 (eight) hours as needed for nausea. Take with food (Patient not taking: Reported on 05/06/2017)   No current facility-administered medications on file prior to visit.     Review of Systems    Constitutional: Negative for activity change, appetite change, chills, diaphoresis, fatigue, fever and unexpected weight change.  HENT: Positive for ear pain. Negative for congestion, hearing loss and sinus pressure.   Eyes: Negative for visual disturbance.  Respiratory: Negative for cough, chest tightness, shortness of breath and wheezing.   Cardiovascular: Negative for chest pain, palpitations and leg swelling.  Gastrointestinal: Negative for abdominal pain, constipation, diarrhea, nausea and vomiting.  Endocrine: Negative for cold intolerance and polyuria.  Genitourinary: Negative for decreased urine volume, difficulty urinating, dysuria, frequency and hematuria.  Musculoskeletal: Positive for arthralgias (wrists/hand). Negative for back pain, myalgias and neck pain.  Skin: Positive for rash (forearms, chest, persistent).  Allergic/Immunologic: Positive for environmental allergies.  Neurological: Negative for dizziness, weakness, light-headedness, numbness and headaches.  Hematological: Negative for adenopathy.  Psychiatric/Behavioral: Negative for agitation, behavioral problems, decreased concentration, dysphoric mood, self-injury, sleep disturbance and suicidal ideas. The patient is nervous/anxious.    Per HPI unless specifically indicated above      Objective:    BP 110/70   Pulse (!) 106   Temp 98.8 F (37.1 C) (Oral)   Resp 16   Ht '5\' 4"'  (1.626 m)   Wt 137 lb (62.1 kg)   LMP 07/15/2015 (Within Days)   BMI 23.52 kg/m   Wt Readings from Last 3 Encounters:  05/06/17 137 lb (62.1 kg)  03/16/17 139 lb (63 kg)  12/07/16 135 lb (61.2 kg)    Physical Exam  Constitutional: She is oriented to person, place, and time. She appears well-developed and well-nourished. No distress.  Well-appearing, comfortable, cooperative  HENT:  Head: Normocephalic and atraumatic.  Mouth/Throat: Oropharynx is clear and moist.  Frontal / maxillary sinuses non-tender. Nares patent without  purulence or edema.   R ear canal with significant soft cerumen obscuring TM. L ear with normal TM clear without erythema, effusion or bulging. Oropharynx clear without erythema, exudates, edema or asymmetry.  Eyes: Conjunctivae and EOM are normal. Pupils are equal, round, and reactive to light.  Neck: Normal range of motion. Neck supple. No thyromegaly present.  Cardiovascular: Regular rhythm, normal heart sounds and intact distal pulses.  No murmur heard. Stable mild tachycardia  Pulmonary/Chest: Effort normal and breath sounds normal. No respiratory distress. She has no wheezes. She has no rales.  Abdominal: Soft. Bowel sounds are normal. She exhibits no distension and no mass. There is no tenderness.  Musculoskeletal: Normal range of motion. She exhibits no edema or tenderness.  Lymphadenopathy:    She has no cervical adenopathy.  Neurological: She is alert and oriented to person, place, and time.  Distal sensation intact to light touch  Skin: Skin is warm and dry. Rash (Unchanged since last visit, bilateral L>R forearm and central sternal chest, fairly non descript relatively flat minimal raised localized erythematous macular rash with some areas of dry skin with flaking. No vesicles or bulla. No ulceration. No induration) noted. She is not diaphoretic.  Psychiatric: She has a normal mood and affect. Her behavior is normal.  Well groomed, good eye contact, normal speech and thoughts. Not anxious appearing today. Good insight into condition  Nursing note and vitals reviewed.  ________________________________________________________ PROCEDURE NOTE Date: 05/06/17 Right Ear Lavage / Cerumen Removal Discussed benefits and risks (including pain / discomforts, dizziness, minor abrasion of ear canal). Verbal consent given by patient. Medication:  carbamide peroxide ear drops, Ear Lavage Solution (warm water + hydrogen peroxide) Performed by Dr Parks Ranger Several drops of carbamide peroxide  placed in each ear, allowed to sit for few minutes. Ear lavage solution flushed into one ear at a time in attempt to dislodge and remove ear wax. Results were mild without complete resolution of ear wax, some removed, but some cerumen remained adhered to external ear canal, soft. Also used Bionix articulating tool to manually remove small amount of cerumen unable to fully remove adhered wax.  Repeat ear exam - R TM visualized without obvious problem, only mild clear effusion.  I have personally reviewed the following lab results from 05/04/17.  Results for orders placed or performed in visit on 26/20/35  BASIC METABOLIC PANEL WITH GFR  Result Value Ref Range   Sodium 142 135 - 146 mmol/L   Potassium 3.9 3.5 - 5.3 mmol/L   Chloride 106 98 - 110 mmol/L   CO2 24 20 - 31 mmol/L   Glucose, Bld 75 65 - 99 mg/dL   BUN 16 7 - 25 mg/dL   Creat 0.89 0.50 - 1.05 mg/dL   Calcium 8.9 8.6 - 10.4 mg/dL   GFR, Est African American 87 >=60 mL/min   GFR, Est Non African American 76 >=60 mL/min  Lipid panel  Result Value Ref Range   Cholesterol 172 <200 mg/dL   Triglycerides 163 (H) <150 mg/dL   HDL 46 (L) >50 mg/dL   Total CHOL/HDL Ratio 3.7 <5.0 Ratio   VLDL 33 (H) <30 mg/dL   LDL Cholesterol 93 <100 mg/dL  Hemoglobin A1c  Result Value Ref Range   Hgb A1c MFr Bld 5.4 <5.7 %   Mean Plasma Glucose 108 mg/dL  VITAMIN D 25 Hydroxy (Vit-D Deficiency, Fractures)  Result Value Ref Range   Vit D, 25-Hydroxy 42 30 - 100 ng/mL  TSH  Result Value Ref Range   TSH 3.06 mIU/L      Assessment & Plan:   Problem List Items Addressed This Visit    SLE (systemic lupus erythematosus) (HCC)    Stable to improved joint pain on Benlysta and also Cymbalta Followed by [facility] Rheum Dr Golda Acre      Insomnia    Improved, now 2 weeks off Ambien using more sleep hygiene Controlled previously on  Ambien CR 6.60m most nights Secondary to anxiety/depression, also maybe some pain with SLE  Plan: 1. Reassurance, can  continue sleep hygiene without Ambien if working well2 2. Has enough Ambien CR to take PRN 3. Follow-up if needed      Hyperlipidemia    Last lipids 04/2017, improved control, TG now only borderline elevated, other lipids normal Not on cholesterol medicine or statin ASCVD 1% Recommend increase regular exercise Advised may re-start OTC Fish Oil 1g 1-2x daily Follow-up fasting lipids yearly      Anxiety and depression    Stable to mildly improved on Cymbalta, still significant life stressors with ill parents - Secondary insomnia GAD7: 14, and PHQ 7 (from 9) Failed: Paxil, Sertraline, Celexa (DC'd due to QTc)  Plan: 1. Continue Cymbalta 310mdaily for now - considered titrate up to 6013minstead start new adjunct therapy with Buspar 5mg33mD for more anxiety component 2. May establish with therapy as needed 3. Follow-up 3 months - likely titrate Buspar if helping      Relevant Medications   busPIRone (BUSPAR) 5 MG tablet    Other Visit Diagnoses    Annual physical exam    -  Primary   Seasonal allergic rhinitis due to other allergic trigger       Relevant Medications   fluticasone (FLONASE) 50 MCG/ACT nasal spray   Sore throat      Suspect secondary to allergic rhinitis and post-nasal drip, no other evidence of URI or infectious cause. - Start Flonase, allergy therapy    Relevant Medications   fluticasone (FLONASE) 50 MCG/ACT nasal spray   Impacted cerumen of right ear      Significant amount of thick soft impacted cerumen Right ear, suspected primary cause of current R ear pain / discomfort  Plan: 1. Attempted office ear lavage cerumen removal with only partial success, noticeable softening and partial removal, but still deeper cerumen impaction. 2. Trial on OTC Debrox drops removal kit and flushing at home        Meds ordered this encounter  Medications  . fluticasone (FLONASE) 50 MCG/ACT nasal spray    Sig: Place 2 sprays into both nostrils daily. Use for 4-6  weeks then stop and use seasonally or as needed.    Dispense:  16 g    Refill:  3  . busPIRone (BUSPAR) 5 MG tablet    Sig: Take 1 tablet (5 mg total) by mouth 2 (two) times daily.    Dispense:  60 tablet    Refill:  5    Follow up plan: Return in about 3 months (around 08/06/2017) for Anxiety GAD7, Insomnia.  AlexNobie Putnam [facility] 05/08/2017, 9:26 AM

## 2017-05-06 NOTE — Patient Instructions (Addendum)
Thank you for coming to the clinic today.  1.  Started Buspar 5mg  twice daily for anxiety - may take 4-6 weeks for better effect. In future, if doing well on this and need higher dose range goes up to 7.5 to 10 to 15 twice daily  Also consider Cymbalta (Duloxetine) up to 60mg  daily  2. Continue Ambien as needed, prefer the improved sleep hygiene - If you need refill let me know  3. Follow-up with Dermatology as planned  4. Send record from Colonoscopy when completed  You have thick impacted ear wax (cerumen) blocking ear canals and ear drums. This is the most likely cause of reduced hearing and ear pain and discomfort.  - After ear flushing in the office, it does appear to have loosened up and is much softer, only partial removal though some of it is adhered to ear canal  Recommend using the over the counter Debrox (Carbamide peroxide), use on affected side following instructions on bottle, pharmacist will direct you to the appropriate ear drops if you need help. May take a week or more.  Avoid using Q-tips inside ears, as this can push wax deeper, but you can try to use rolled up kleenex as a wick to absorb fluid and wax as well.  Please schedule a Follow-up Appointment to: Return in about 3 months (around 08/06/2017) for Anxiety GAD7, Insomnia.  If you have any other questions or concerns, please feel free to call the clinic or send a message through MyChart. You may also schedule an earlier appointment if necessary.  Saralyn Pilar, DO [facility], [place]

## 2017-05-08 NOTE — Assessment & Plan Note (Addendum)
Stable to mildly improved on Cymbalta, still significant life stressors with ill parents - Secondary insomnia GAD7: 14, and PHQ 7 (from 9) Failed: Paxil, Sertraline, Celexa (DC'd due to QTc)  Plan: 1. Continue Cymbalta 30mg  daily for now - considered titrate up to 60mg , instead start new adjunct therapy with Buspar 5mg  BID for more anxiety component 2. May establish with therapy as needed 3. Follow-up 3 months - likely titrate Buspar if helping

## 2017-05-08 NOTE — Assessment & Plan Note (Signed)
Improved, now 2 weeks off Ambien using more sleep hygiene Controlled previously on Ambien CR 6.25mg  most nights Secondary to anxiety/depression, also maybe some pain with SLE  Plan: 1. Reassurance, can continue sleep hygiene without Ambien if working well2 2. Has enough Ambien CR to take PRN 3. Follow-up if needed

## 2017-05-08 NOTE — Assessment & Plan Note (Signed)
Last lipids 04/2017, improved control, TG now only borderline elevated, other lipids normal Not on cholesterol medicine or statin ASCVD 1% Recommend increase regular exercise Advised may re-start OTC Fish Oil 1g 1-2x daily Follow-up fasting lipids yearly

## 2017-05-08 NOTE — Assessment & Plan Note (Signed)
Stable to improved joint pain on Benlysta and also Cymbalta Followed by [facility] Rheum Dr Morene Rankins

## 2017-06-19 ENCOUNTER — Other Ambulatory Visit: Payer: Self-pay | Admitting: Family Medicine

## 2017-06-19 DIAGNOSIS — K219 Gastro-esophageal reflux disease without esophagitis: Secondary | ICD-10-CM

## 2017-06-20 MED ORDER — PANTOPRAZOLE SODIUM 40 MG PO TBEC: 40.0000 mg | DELAYED_RELEASE_TABLET | Freq: Every day | ORAL | 3 refills | Status: DC

## 2017-06-20 NOTE — Telephone Encounter (Signed)
Last office visit 05/06/17 Dr Verdie Shire sertraline discontinued by Dr Cheron Schaumann  No office visit scheduled

## 2017-06-20 NOTE — Telephone Encounter (Signed)
Previous rx were from prior PCP Dr Everlene Other.  I have declined both refills initially. She was changed off of Sertraline previously, and is more stable on Cymbalta, no change to this SSRI rx today.  Re-ordered Pantoprazole 40mg  daily #90 + 3 refills in my name, should not send refills to Dr Adriana Simas now.  Saralyn Pilar, DO [facility] 06/20/2017, 12:33 PM

## 2017-06-22 ENCOUNTER — Encounter: Payer: Self-pay | Admitting: Family Medicine

## 2017-06-22 DIAGNOSIS — F32A Depression, unspecified: Secondary | ICD-10-CM

## 2017-06-22 DIAGNOSIS — F419 Anxiety disorder, unspecified: Principal | ICD-10-CM

## 2017-06-22 DIAGNOSIS — F329 Major depressive disorder, single episode, unspecified: Secondary | ICD-10-CM

## 2017-06-22 MED ORDER — DULOXETINE HCL 60 MG PO CPEP: 60.0000 mg | ORAL_CAPSULE | Freq: Every day | ORAL | 1 refills | Status: DC

## 2017-08-09 ENCOUNTER — Encounter: Payer: Self-pay | Admitting: Family Medicine

## 2017-08-09 DIAGNOSIS — N6310 Unspecified lump in the right breast, unspecified quadrant: Secondary | ICD-10-CM

## 2017-08-19 ENCOUNTER — Ambulatory Visit: Payer: Self-pay | Admitting: Family Medicine

## 2017-08-22 ENCOUNTER — Ambulatory Visit: Payer: BLUE CROSS/BLUE SHIELD | Admitting: Family Medicine

## 2017-09-15 ENCOUNTER — Ambulatory Visit (INDEPENDENT_AMBULATORY_CARE_PROVIDER_SITE_OTHER): Payer: BLUE CROSS/BLUE SHIELD | Admitting: Family Medicine

## 2017-09-15 ENCOUNTER — Ambulatory Visit: Payer: BLUE CROSS/BLUE SHIELD

## 2017-09-15 ENCOUNTER — Encounter: Payer: Self-pay | Admitting: Family Medicine

## 2017-09-15 VITALS — BP 101/67 | HR 125 | Temp 99.0°F | Resp 16 | Ht 64.0 in | Wt 139.0 lb

## 2017-09-15 DIAGNOSIS — M7989 Other specified soft tissue disorders: Secondary | ICD-10-CM | POA: Diagnosis not present

## 2017-09-15 DIAGNOSIS — M79642 Pain in left hand: Secondary | ICD-10-CM | POA: Diagnosis not present

## 2017-09-15 DIAGNOSIS — M79641 Pain in right hand: Secondary | ICD-10-CM | POA: Diagnosis not present

## 2017-09-15 DIAGNOSIS — M328 Other forms of systemic lupus erythematosus: Secondary | ICD-10-CM

## 2017-09-15 MED ORDER — KETOROLAC TROMETHAMINE 60 MG/2ML IM SOLN
60.0000 mg | Freq: Once | INTRAMUSCULAR | Status: AC
  Administered 2017-09-15: 60 mg via INTRAMUSCULAR

## 2017-09-15 MED ORDER — CYCLOBENZAPRINE HCL 10 MG PO TABS: 5.0000 mg | ORAL_TABLET | Freq: Every evening | ORAL | 0 refills | Status: DC | PRN

## 2017-09-15 MED ORDER — PREDNISONE 10 MG PO TABS: ORAL_TABLET | ORAL | 0 refills | Status: DC

## 2017-09-15 NOTE — Patient Instructions (Addendum)
Thank you for coming to the clinic today.  1. Most likely related to SLE lupus / arthritis flare up since involving both hands, and also related to weather possibly - Pain and swelling are due to inflammation - Start Prednisone (strongest anti-inflammatory) burst from 60mg  down to 10mg  over 6 days, continue current 7 mg daily in addition to this, and if not improved by day 5-6 then you can go back up to dose of 3 pills daily for 3-5 more days  Toradol 60mg  IM x 1 dose in office today  HOLD Aleve dose tonight and actually HOLD aleve through rest of prednisone.  Recommend to start taking Tylenol Extra Strength 500mg  tabs - take 1 to 2 tabs per dose (max 1000mg ) every 6-8 hours for pain (take regularly, don't skip a dose for next 7 days), max 24 hour daily dose is 6 tablets or 3000mg . In the future you can repeat the same everyday Tylenol course for 1-2 weeks at a time.   Use RICE therapy: - R - Rest / relative rest with activity modification avoid overuse of joint - I - Ice packs (make sure you use a towel or sock / something to protect skin) - C - Compression / Support with wrist splint (BILATERAL) as tolerated - E - Elevation - if significant swelling  Consider future discussion with Rheumatology about localized steroid injection or can possibly refer to Ortho  Start Cyclobenzapine (Flexeril) 10mg  tablets (muscle relaxant) - start with half (cut) to one whole pill at night for muscle relaxant - may make you sedated or sleepy (be careful driving or working on this) if tolerated you can take half to whole tab 2 to 3 times daily or every 8 hours as needed   --------------------------------------------  Think about carpal tunnel in future - may benefit from nerve conduction studies to check nerve function as this may be contributing to pain.  Please schedule a Follow-up Appointment to: Return in about 2 weeks (around 09/29/2017), or if symptoms worsen or fail to improve, for wrist pain.  If  you have any other questions or concerns, please feel free to call the clinic or send a message through MyChart. You may also schedule an earlier appointment if necessary.  Additionally, you may be receiving a survey about your experience at our clinic within a few days to 1 week by e-mail or mail. We value your feedback.  Saralyn Pilar, DO [facility], [place]

## 2017-09-15 NOTE — Progress Notes (Signed)
Subjective:    Patient ID: Christina Meza, female    DOB: [birth year], 51 y.o.   MRN: [id number]  Christina Meza is a 51 y.o. female presenting on 09/15/2017 for Hand Pain (left is greater than Right has tingling and throbbing pain onset yesterday and Right side pain started today)  Patient presents for a same day appointment.  HPI   Left Finger/Hand/Wrist Pain and Swelling / SLE / Arthritis: - Reports known chronic history of SLE and arthritis of small joints, including hands finger MCP. Followed by [facility] Rheumatology Dr Morene Rankins, last see 05/2017 had similar problem R wrist pain and throbbing with intermittent flares, usually wears wrist splint/brace, worse with overactivity, improves overnight and with rest, previously on Aleve more regularly, then switched to different SLE therapy and had improved - Now new complaint similar to R hand now with L finger/wrist pain and swelling, onset yesterday, L hand pain with index and middle fingers swollen slightly red, tingling and pins and needle, pressure and movements even from ice pack would radiate throbbing and numbness, and worse with arms down with inc swelling and would wake up from pain - Simliar flares before but current episode is worse - No known injury, overuse or fall - Just restarted Aleve yesterday 250mg  OTC x 2 - twice daily, limited relief - She was unable to get into her Rheumatologist on short notice - No recent prednisone bursts. Takes Prednisone 7mg  daily, in past she has taken medrol dosepak burst - Admits occasional paresthesia symptoms L hand > R with index/middle finger mostly - Denies any fevers/chills, spreading redness, drainage of pus, ulceration, nausea vomiting, persistent numbness tingling weakness  Health Maintenance: - Due for Flu Shot, will likely receive in future  Depression screen [facility] 2/9 05/06/2017 09/22/2016 08/10/2016  Decreased Interest 0 0 1  Down, Depressed, Hopeless 0 1 1  PHQ - 2 Score 0 1 2  Altered  sleeping 2 3 3   Tired, decreased energy 3 3 1   Change in appetite 1 1 0  Feeling bad or failure about yourself  0 0 0  Trouble concentrating 0 1 1  Moving slowly or fidgety/restless 1 0 0  Suicidal thoughts 0 0 0  PHQ-9 Score 7 9 7   Difficult doing work/chores - Not difficult at all Not difficult at all    Social History  Substance Use Topics  . Smoking status: Never Smoker  . Smokeless tobacco: Never Used  . Alcohol use 0.0 - 0.6 oz/week    Review of Systems Per HPI unless specifically indicated above     Objective:    BP 101/67   Pulse (!) 125   Temp 99 F (37.2 C) (Oral)   Resp 16   Ht 5\' 4"  (1.626 m)   Wt 139 lb (63 kg)   LMP 07/15/2015 (Within Days)   BMI 23.86 kg/m   Wt Readings from Last 3 Encounters:  09/15/17 139 lb (63 kg)  05/06/17 137 lb (62.1 kg)  03/16/17 139 lb (63 kg)    Physical Exam  Constitutional: She is oriented to person, place, and time. She appears well-developed and well-nourished. No distress.  Tired appearing, uncomfortable with hand pain, cooperative  HENT:  Head: Normocephalic and atraumatic.  Mouth/Throat: Oropharynx is clear and moist.  Eyes: Conjunctivae are normal. Right eye exhibits no discharge. Left eye exhibits no discharge.  Cardiovascular: Regular rhythm, normal heart sounds and intact distal pulses.   No murmur heard. Tachycardic  Pulmonary/Chest: Effort normal.  Musculoskeletal: She exhibits edema (  bilateral fingers, L>R index, middle fingers and MCP).  Bilateral Hand/Wrist Inspection: Abnormal appearance bilateral mostly symmetrical with L > R more severe, index and middle fingers with some distal edema without erythema, bulky joints of DIP and MCPs  Palpation: Tender left index finger distally, and mild tender middle finger and MCPs L > R, also base of thumb. No distinct anatomical snuff box or scaphoid tenderness. Special Testing: Tinel's median nerve test positive Left with mild reproduced symptoms tingling in L hand,  R negative. Negative Tinel's over Elbow - Did not perform phalen/reverse phalen due to discomfort Strength: Limited strength due to pain, difficult testing grip Neurovascular: distally intact  Neurological: She is alert and oriented to person, place, and time.  Skin: Skin is warm and dry. No rash noted. She is not diaphoretic. No erythema.  Psychiatric: She has a normal mood and affect. Her behavior is normal.  Nursing note and vitals reviewed.  Results for orders placed or performed in visit on 05/04/17  BASIC METABOLIC PANEL WITH GFR  Result Value Ref Range   Sodium 142 135 - 146 mmol/L   Potassium 3.9 3.5 - 5.3 mmol/L   Chloride 106 98 - 110 mmol/L   CO2 24 20 - 31 mmol/L   Glucose, Bld 75 65 - 99 mg/dL   BUN 16 7 - 25 mg/dL   Creat 1.61 0.96 - 0.45 mg/dL   Calcium 8.9 8.6 - 40.9 mg/dL   GFR, Est African American 87 >=60 mL/min   GFR, Est Non African American 76 >=60 mL/min  Lipid panel  Result Value Ref Range   Cholesterol 172 <200 mg/dL   Triglycerides 811 (H) <150 mg/dL   HDL 46 (L) >91 mg/dL   Total CHOL/HDL Ratio 3.7 <5.0 Ratio   VLDL 33 (H) <30 mg/dL   LDL Cholesterol 93 <478 mg/dL  Hemoglobin G9F  Result Value Ref Range   Hgb A1c MFr Bld 5.4 <5.7 %   Mean Plasma Glucose 108 mg/dL  VITAMIN D 25 Hydroxy (Vit-D Deficiency, Fractures)  Result Value Ref Range   Vit D, 25-Hydroxy 42 30 - 100 ng/mL  TSH  Result Value Ref Range   TSH 3.06 mIU/L      Assessment & Plan:   Problem List Items Addressed This Visit    SLE (systemic lupus erythematosus) (HCC)   Relevant Medications   predniSONE (DELTASONE) 10 MG tablet   ketorolac (TORADOL) injection 60 mg (Completed)   cyclobenzaprine (FLEXERIL) 10 MG tablet    Other Visit Diagnoses    Bilateral hand swelling    -  Primary   Relevant Medications   predniSONE (DELTASONE) 10 MG tablet   ketorolac (TORADOL) injection 60 mg (Completed)   Bilateral hand pain       Relevant Medications   predniSONE (DELTASONE) 10 MG  tablet   ketorolac (TORADOL) injection 60 mg (Completed)   cyclobenzaprine (FLEXERIL) 10 MG tablet  Suspected Left hand index/middle finger, MCP, wrist pain and swelling is secondary to inflammatory arthritic SLE etiology, given no other source of injury or trauma, no evidence of infection or erythema, afebrile, and the generalized bilateral nature her symptoms, similar to previous SLE flare recently evaluated by Rheum in R hand/wrist. - Not responding to NSAIDs currently - Also considered possible carpal tunnel or neuropathy component with some of her symptoms in median nerve distribution  Plan: 1. Toradol IM  x 1 dose in office today - HOLD other NSAIDs for now, including Aleve tonight 2. START Prednisone dose pak taper   down to  over 6 days, and given extra pills for extended taper for  daily x 3-5 more days if needed based on response, otherwise do not use extra prednisone 3. HOLD Aleve while on prednisone then may resume in future 4. RICE therapy with rest, may use ice if tolerated, elevation, can use wrist splint L and/or bilateral especially at night 5. out of work for now avoid repetitive activities may return Monday if improved 6. Encouraged her to follow-up closely with Rheumatology if still not improving and she may benefit from localized or targeted steroid injection in future.       Meds ordered this encounter  Medications  . DISCONTD: Belimumab 200 MG/ML SOAJ    Sig: Inject 200 mg into the skin.  . predniSONE (DELTASONE) 10 MG tablet    Sig: Take 6 tabs w/ food Day 1, 5 tabs Day 2, 4 tabs Day 3, 3 tabs Day 4, 2 tabs Day 5, 1 tab Day 6. May take 3 tabs for 3-5 more days if need    Dispense:  36 tablet    Refill:  0  . ketorolac (TORADOL) injection 60 mg  . cyclobenzaprine (FLEXERIL) 10 MG tablet    Sig: Take 0.5-1 tablets (5-10 mg total) by mouth at bedtime as needed for muscle spasms.    Dispense:  30 tablet    Refill:  0    Follow up plan: Return  in about 2 weeks (around 09/29/2017), or if symptoms worsen or fail to improve, for wrist pain.  Saralyn Pilar, DO [facility] 09/16/2017, 10:01 PM

## 2017-09-23 ENCOUNTER — Telehealth: Payer: Self-pay | Admitting: Family Medicine

## 2017-09-23 DIAGNOSIS — L03012 Cellulitis of left finger: Secondary | ICD-10-CM

## 2017-09-23 MED ORDER — DOXYCYCLINE HYCLATE 100 MG PO TABS: 100.0000 mg | ORAL_TABLET | Freq: Two times a day (BID) | ORAL | 0 refills | Status: DC

## 2017-09-23 NOTE — Telephone Encounter (Signed)
Last office visit 09/15/17, see note for details, called patient today 10/19 due to received FMLA paperwork from her employer. I checked her chart for scanned documents she has same FMLA paperwork completed by me in 01/2017 for SLE.  I called her to clarify about the paperwork if it is required for new FMLA paperwork for current issue, or updating old paperwork, or just normal renewal of same FMLA. She has already put a call out to her supervisor or rep to find out more information about her current FMLA status, as she had the same question.  Additionally, she notified me that her finger seemed to swell a little more and was red she trimmed the nail back and it seemed to drain a small amount of pus, seems improved, she is soaking it, thinks it may be involving nailbed. I advised that we should cover with an oral antibiotic, concern given the extend of her erythema and swelling in office but it was more consistent with SLE at that time. Now 8 days later seems to have changed. She has Sulfa allergy, I sent Doxycycline rx to pharmacy. She will follow-up if not improved.  She will contact me for updates on how to complete this FMLA paperwork  Saralyn Pilar, DO [facility] 09/23/2017, 4:51 PM

## 2017-10-19 ENCOUNTER — Other Ambulatory Visit: Payer: Self-pay | Admitting: Cardiovascular Disease

## 2017-10-21 ENCOUNTER — Ambulatory Visit
Admission: RE | Admit: 2017-10-21 | Discharge: 2017-10-21 | Disposition: A | Discharge status: Home / Self Care | Payer: BLUE CROSS/BLUE SHIELD | Source: Ambulatory Visit | Attending: Family Medicine | Admitting: Family Medicine

## 2017-10-21 DIAGNOSIS — Z9889 Other specified postprocedural states: Secondary | ICD-10-CM | POA: Diagnosis not present

## 2017-10-21 DIAGNOSIS — N6310 Unspecified lump in the right breast, unspecified quadrant: Secondary | ICD-10-CM | POA: Insufficient documentation

## 2017-11-14 ENCOUNTER — Ambulatory Visit: Payer: BLUE CROSS/BLUE SHIELD | Admitting: Cardiovascular Disease

## 2017-11-15 ENCOUNTER — Other Ambulatory Visit: Payer: Self-pay | Admitting: Cardiovascular Disease

## 2017-11-16 ENCOUNTER — Other Ambulatory Visit: Payer: Self-pay | Admitting: Cardiovascular Disease

## 2017-11-16 ENCOUNTER — Encounter: Payer: Self-pay | Admitting: *Deleted

## 2017-11-16 ENCOUNTER — Encounter: Payer: Self-pay | Admitting: Physician Assistant

## 2017-12-08 ENCOUNTER — Encounter: Payer: Self-pay | Admitting: Physician Assistant

## 2017-12-08 NOTE — Progress Notes (Signed)
Cardiology Office Note Date:  12/13/2017  Patient ID:  Christina Meza, DOB 09/03/1966, MRN [id number] PCP:  Smitty Cords, DO  Cardiologist:  Dr. Kirke Corin, MD    Chief Complaint: Follow-up  History of Present Illness: Christina Meza is a 52 y.o. female with history of inappropriate sinus tachycardia, prolonged QT, lupus, asthma, C diff, migraine disorder, and depression who presents for follow-up of inappropriate sinus tachycardia.  She was last seen in the office just over 1 year ago for routine follow up of the above. She had a negative stress test in 2010 with normal LV function was echo in 2014. At her last follow up in 11/2016 she reported she was doing well over the prior year without any episodes of palpitations or inappropriate sinus tachycardia. She remained on verapamil without issues. Her QTc was stable to improved at that time, measuring 485. She had recently been started on Celexa (was already on plaquenil). She has since been changed from Celexa to Cymbalta. Echo 12/2016 given her SLE showed EF 50-55%, no RWMA, Gr1DD, mildly dilated aortic root of 3.4 cm, mildly dilated ascending aorta of 3.7 cm, mild mitral regurgitation, normal size left atrium, normal RV systolic function, PASP normal.    Most recent labs from 04/2017 showed normal thyroid function, potassium 3.9, LDL 93, A1c 5.4.   Over the past 6 months she has noted a worsening and her sinus tachycardia/palpitations.  Resting heart rates typically in the low 100s-1 teens with ambulatory heart rates in the 120s bpm.  Notes what she describes as frequent PVCs at nighttime when laying down trying to sleep.  No associated chest pain, shortness of breath, dizziness, diaphoresis, presyncope, or syncope.  Has recently been started on Belimumab along with methotrexate for her lupus.  Now meeting Zofran several days per week secondary to nausea.   Past Medical History:  Diagnosis Date  . Allergy   . Arthritis   .  Asthma   . Clostridium difficile infection    2015x2 , 2016x1  . Depression   . Diastolic dysfunction    a. TTE 1/18: EF 50-55%, no RWMA, Gr1DD, mildly dilated aortic root of 3.4 cm, mildly dilated ascending aorta of 3.7 cm, mild mitral regurgitation, normal size left atrium, normal RV systolic function, PASP normal  . Genital warts   . GERD (gastroesophageal reflux disease)   . Inappropriate sinus tachycardia    a. 2010 Nuc Stress test: no ischemia/infarct;  b. 10/2013 Echo: Nl LV fxn, mild MR/TR.  . Migraine   . Palpitations   . Prolonged QT interval   . SLE (systemic lupus erythematosus) (HCC)   . Stomach ulcer     Past Surgical History:  Procedure Laterality Date  . BREAST BIOPSY Right 03/01/2017   u/s core bx neg ADENOSIS AND FOCAL MICROCYST FORMATION.  Marland Kitchen BREAST CYST ASPIRATION Left 4-5 yrs ago   NEG  . CATARACT EXTRACTION  2008  . ENDOSCOPIC VEIN LASER TREATMENT    . UMBILICAL HERNIA REPAIR    . vulvar excision for HPV  07/04/2014    Current Meds  Medication Sig  . acetaminophen (TYLENOL) 650 MG suppository Place 650 mg rectally every 4 (four) hours as needed.  . Belimumab 200 MG/ML SOAJ Inject 200 mg into the skin.  . Biotin 5000 MCG TABS Take by mouth.  . Cholecalciferol (VITAMIN D3) 3000 units TABS Take by mouth daily.  . diclofenac sodium (VOLTAREN) 1 % GEL Apply 1 application topically as needed.  . DULoxetine (CYMBALTA) 60 MG  capsule Take 1 capsule (60 mg total) by mouth daily.  . hydroxychloroquine (PLAQUENIL) 200 MG tablet Take 200 mg by mouth 2 (two) times daily.   Marland Kitchen levalbuterol (XOPENEX HFA) 45 MCG/ACT inhaler Inhale 1 puff into the lungs every 4 (four) hours as needed for wheezing. Reported on 03/14/2016  . methotrexate (RHEUMATREX) 2.5 MG tablet Take 12.5 mg by mouth once a week.   . mometasone (ASMANEX) 220 MCG/INH inhaler Inhale 2 puffs into the lungs daily.  . Multiple Vitamins-Minerals (MULTIVITAMIN WITH MINERALS) tablet Take 1 tablet by mouth every  morning.   . naproxen sodium (ALEVE) 220 MG tablet Take 1 tablet (220 mg total) by mouth at bedtime.  . ondansetron (ZOFRAN) 4 MG tablet Take 1 tablet (4 mg total) by mouth every 8 (eight) hours as needed for nausea or vomiting.  . pantoprazole (PROTONIX) 40 MG tablet Take 1 tablet (40 mg total) by mouth daily.  . predniSONE (DELTASONE) 5 MG tablet Take 7 mg by mouth daily with breakfast.   . Probiotic Product (PROBIOTIC DAILY PO) Take 1 tablet by mouth daily.   Marland Kitchen triamcinolone cream (KENALOG) 0.5 % Apply 1 application topically 2 (two) times daily. To affected areas, for up to 2 weeks.  . valACYclovir (VALTREX) 500 MG tablet Take 500 mg by mouth at bedtime.  . verapamil (CALAN-SR) 120 MG CR tablet Take 1 tablet (120 mg total) by mouth at bedtime.  . vitamin E 400 UNIT capsule Take 400 Units by mouth daily.  Marland Kitchen zolpidem (AMBIEN CR) 6.25 MG CR tablet Take 1 tablet (6.25 mg total) by mouth at bedtime as needed for sleep.    Allergies:   Lansoprazole; Celebrex [celecoxib]; Sulfa antibiotics; Sulfamethoxazole-trimethoprim; and Furosemide   Social History:  The patient  reports that  has never smoked. she has never used smokeless tobacco. She reports that she drinks alcohol. She reports that she does not use drugs.   Family History:  The patient's family history includes Arthritis in her mother; Bipolar disorder in her mother; Colon cancer in her paternal grandfather; Depression in her mother; Heart disease in her maternal grandfather; Hyperlipidemia in her maternal grandfather; Hypertension in her maternal grandfather and maternal grandmother; Mental illness in her mother; Stroke in her maternal grandfather.  ROS:   Review of Systems  Constitutional: Positive for malaise/fatigue. Negative for chills, diaphoresis, fever and weight loss.  HENT: Negative for congestion.   Eyes: Negative for discharge and redness.  Respiratory: Negative for cough, hemoptysis, sputum production, shortness of breath and  wheezing.   Cardiovascular: Positive for palpitations. Negative for chest pain, orthopnea, claudication, leg swelling and PND.  Gastrointestinal: Negative for abdominal pain, blood in stool, heartburn, melena, nausea and vomiting.  Genitourinary: Negative for hematuria.  Musculoskeletal: Negative for falls and myalgias.  Skin: Negative for rash.  Neurological: Positive for weakness. Negative for dizziness, tingling, tremors, sensory change, speech change, focal weakness and loss of consciousness.  Endo/Heme/Allergies: Does not bruise/bleed easily.  Psychiatric/Behavioral: Negative for substance abuse. The patient is not nervous/anxious.   All other systems reviewed and are negative.    PHYSICAL EXAM:  VS:  BP 100/70 (BP Location: Left Arm, Patient Position: Sitting, Cuff Size: Normal)   Pulse (!) 110   Ht 5\' 4"  (1.626 m)   Wt 142 lb 12 oz (64.8 kg)   LMP 07/15/2015 (Within Days)   BMI 24.50 kg/m  BMI: Body mass index is 24.5 kg/m.  Physical Exam  Constitutional: She is oriented to person, place, and time. She appears  well-developed and well-nourished.  HENT:  Head: Normocephalic and atraumatic.  Eyes: Right eye exhibits no discharge. Left eye exhibits no discharge.  Neck: Normal range of motion. No JVD present.  Cardiovascular: Regular rhythm, S1 normal, S2 normal and normal heart sounds. Tachycardia present. Exam reveals no distant heart sounds, no friction rub, no midsystolic click and no opening snap.  No murmur heard. Pulses:      Posterior tibial pulses are 2+ on the right side, and 2+ on the left side.  Pulmonary/Chest: Effort normal and breath sounds normal. No respiratory distress. She has no decreased breath sounds. She has no wheezes. She has no rales. She exhibits no tenderness.  Abdominal: Soft. She exhibits no distension. There is no tenderness.  Musculoskeletal: She exhibits no edema.  Neurological: She is alert and oriented to person, place, and time.  Skin: Skin is  warm and dry. No cyanosis. Nails show no clubbing.  Psychiatric: She has a normal mood and affect. Her speech is normal and behavior is normal. Judgment and thought content normal.     EKG:  Was ordered and interpreted by me today. Shows sinus tachycardia, 110 bpm, incomplete right bundle-branch, QTC 503  Recent Labs: 05/04/2017: BUN 16; Creat 0.89; Potassium 3.9; Sodium 142; TSH 3.06  05/04/2017: Cholesterol 172; HDL 46; LDL Cholesterol 93; Total CHOL/HDL Ratio 3.7; Triglycerides 163; VLDL 33   CrCl cannot be calculated (Patient's most recent lab result is older than the maximum 21 days allowed.).   Wt Readings from Last 3 Encounters:  12/13/17 142 lb 12 oz (64.8 kg)  09/15/17 139 lb (63 kg)  05/06/17 137 lb (62.1 kg)     Other studies reviewed: Additional studies/records reviewed today include: summarized above  ASSESSMENT AND PLAN:  1. Inappropriate sinus tachycardia: Over the past 6 months she has noted a worsening of her sinus tachycardia.  This has coincided with recent medication additions for her lupus.  We will check for any laboratory abnormalities including CBC, electrolytes, and thyroid function.  We will also have the patient evaluated with a 48-hour Holter monitor given her worsening sinus tachycardia and subjective PVCs.  If laboratory analysis and outpatient cardiac monitoring is unrevealing, consider titration of verapamil if blood pressure allows versus possible off label use of Corlanor.   2. Palpitations: Schedule Holter monitor as above.  3. Prolonged QTC: Likely in the setting of increased Zofran use.  May need to discontinue this medication though will defer to prescribing provider.  Check electrolytes and thyroid function.  Plan to have the patient follow-up for RN EKG visit in 1 week.  4. SLE: Recent echocardiogram without cardiac involvement.  Per rheumatology.  Disposition: F/u with Dr. Kirke Corin in 1 month.   Current medicines are reviewed at length with the  patient today.  The patient did not have any concerns regarding medicines.  Elinor Dodge PA-C 12/13/2017 3:05 PM     [facility] [place] [address] [place], [place] [address] [phone]

## 2017-12-13 ENCOUNTER — Encounter: Payer: Self-pay | Admitting: Physician Assistant

## 2017-12-13 ENCOUNTER — Other Ambulatory Visit
Admission: RE | Admit: 2017-12-13 | Discharge: 2017-12-13 | Disposition: A | Discharge status: Home / Self Care | Payer: BLUE CROSS/BLUE SHIELD | Source: Ambulatory Visit | Attending: Physician Assistant | Admitting: Physician Assistant

## 2017-12-13 ENCOUNTER — Ambulatory Visit (INDEPENDENT_AMBULATORY_CARE_PROVIDER_SITE_OTHER): Payer: BLUE CROSS/BLUE SHIELD | Admitting: Physician Assistant

## 2017-12-13 VITALS — BP 100/70 | HR 110 | Ht 64.0 in | Wt 142.8 lb

## 2017-12-13 DIAGNOSIS — R002 Palpitations: Secondary | ICD-10-CM | POA: Insufficient documentation

## 2017-12-13 DIAGNOSIS — R000 Tachycardia, unspecified: Secondary | ICD-10-CM

## 2017-12-13 DIAGNOSIS — M329 Systemic lupus erythematosus, unspecified: Secondary | ICD-10-CM | POA: Diagnosis not present

## 2017-12-13 DIAGNOSIS — R9431 Abnormal electrocardiogram [ECG] [EKG]: Secondary | ICD-10-CM

## 2017-12-13 LAB — COMPREHENSIVE METABOLIC PANEL
ALBUMIN: 4.2 g/dL (ref 3.5–5.0)
ALT: 20 U/L (ref 14–54)
AST: 29 U/L (ref 15–41)
Alkaline Phosphatase: 78 U/L (ref 38–126)
Anion gap: 6 (ref 5–15)
BILIRUBIN TOTAL: 0.6 mg/dL (ref 0.3–1.2)
BUN: 19 mg/dL (ref 6–20)
CHLORIDE: 105 mmol/L (ref 101–111)
CO2: 25 mmol/L (ref 22–32)
Calcium: 9 mg/dL (ref 8.9–10.3)
Creatinine, Ser: 0.82 mg/dL (ref 0.44–1.00)
GFR calc Af Amer: >60 mL/min (ref >60)
GFR calc non Af Amer: >60 mL/min (ref >60)
GLUCOSE: 108 mg/dL — AB (ref 65–99)
Potassium: 4.4 mmol/L (ref 3.5–5.1)
Sodium: 136 mmol/L (ref 135–145)
TOTAL PROTEIN: 7.3 g/dL (ref 6.5–8.1)

## 2017-12-13 LAB — CBC WITH DIFFERENTIAL/PLATELET
BASOS ABS: 0 10*3/uL (ref 0–0.1)
BASOS PCT: 1 %
Eosinophils Absolute: 0 10*3/uL (ref 0–0.7)
Eosinophils Relative: 0 %
HEMATOCRIT: 39.8 % (ref 35.0–47.0)
Hemoglobin: 13 g/dL (ref 12.0–16.0)
Lymphocytes Relative: 11 %
Lymphs Abs: 0.4 10*3/uL — ABNORMAL LOW (ref 1.0–3.6)
MCH: 31.4 pg (ref 26.0–34.0)
MCHC: 32.7 g/dL (ref 32.0–36.0)
MCV: 96 fL (ref 80.0–100.0)
Monocytes Absolute: 0.3 10*3/uL (ref 0.2–0.9)
Monocytes Relative: 9 %
NEUTROS ABS: 2.9 10*3/uL (ref 1.4–6.5)
NEUTROS PCT: 79 %
Platelets: 211 10*3/uL (ref 150–440)
RBC: 4.14 MIL/uL (ref 3.80–5.20)
RDW: 13.5 % (ref 11.5–14.5)
WBC: 3.6 10*3/uL (ref 3.6–11.0)

## 2017-12-13 LAB — TSH: TSH: 1.626 u[IU]/mL (ref 0.350–4.500)

## 2017-12-13 LAB — MAGNESIUM: Magnesium: 1.9 mg/dL (ref 1.7–2.4)

## 2017-12-13 NOTE — Patient Instructions (Addendum)
Medication Instructions:  Your physician recommends that you continue on your current medications as directed. Please refer to the Current Medication list given to you today.   Labwork: Your physician recommends that you return for lab work in: TODAY (CMET, CBC, TSH, MG). - Please go to the [facility]. You will check in at the front desk to the right as you walk into the atrium. Valet Parking is offered if needed.    Testing/Procedures: Your physician has recommended that you wear a 48 HOUR holter monitor. Holter monitors are medical devices that record the heart's electrical activity. Doctors most often use these monitors to diagnose arrhythmias. Arrhythmias are problems with the speed or rhythm of the heartbeat. The monitor is a small, portable device. You can wear one while you do your normal daily activities. This is usually used to diagnose what is causing palpitations/syncope (passing out).    Follow-Up: Your physician recommends that you schedule a follow-up appointment in: 1 WEEK FOR NURSE VISIT EKG CHECK.  Your physician recommends that you schedule a follow-up appointment in: 1 MONTH.   If you need a refill on your cardiac medications before your next appointment, please call your pharmacy.    Holter Monitoring A Holter monitor is a small device that is used to detect abnormal heart rhythms. It clips to your clothing and is connected by wires to flat, sticky disks (electrodes) that attach to your chest. It is worn continuously for 24-48 hours. Follow these instructions at home:  Wear your Holter monitor at all times, even while exercising and sleeping, for as long as directed by your health care provider.  Make sure that the Holter monitor is safely clipped to your clothing or close to your body as recommended by your health care provider.  Do not get the monitor or wires wet.  Do not put body lotion or moisturizer on your chest.  Keep your skin clean.  Keep a  diary of your daily activities, such as walking and doing chores. If you feel that your heartbeat is abnormal or that your heart is fluttering or skipping a beat: ? Record what you are doing when it happens. ? Record what time of day the symptoms occur.  Return your Holter monitor as directed by your health care provider.  Keep all follow-up visits as directed by your health care provider. This is important. Get help right away if:  You feel lightheaded or you faint.  You have trouble breathing.  You feel pain in your chest, upper arm, or jaw.  You feel sick to your stomach and your skin is pale, cool, or damp.  You heartbeat feels unusual or abnormal. This information is not intended to replace advice given to you by your health care provider. Make sure you discuss any questions you have with your health care provider. Document Released: 08/20/2004 Document Revised: 04/29/2016 Document Reviewed: 07/01/2014 Elsevier Interactive Patient Education  [employer].

## 2017-12-14 ENCOUNTER — Telehealth: Payer: Self-pay | Admitting: Cardiovascular Disease

## 2017-12-14 DIAGNOSIS — R000 Tachycardia, unspecified: Secondary | ICD-10-CM

## 2017-12-14 MED ORDER — MAGNESIUM OXIDE 400 MG PO TABS: 400.0000 mg | ORAL_TABLET | Freq: Every day | ORAL | 3 refills | Status: DC

## 2017-12-14 NOTE — Telephone Encounter (Signed)
Patient returning call for [facility]

## 2017-12-14 NOTE — Telephone Encounter (Signed)
S/w patient. Gave her the lab results from yesterday resulted by Eula Listen. She verbalized understanding of results and plan of care. Rx for mag oxide 400 mg daily sent to pharmacy and referral to Dr Graciela Husbands placed. Patient transferred to scheduler to schedule referral to Dr Graciela Husbands.

## 2017-12-16 ENCOUNTER — Encounter (INDEPENDENT_AMBULATORY_CARE_PROVIDER_SITE_OTHER): Payer: BLUE CROSS/BLUE SHIELD

## 2017-12-16 DIAGNOSIS — R000 Tachycardia, unspecified: Secondary | ICD-10-CM

## 2017-12-16 DIAGNOSIS — R002 Palpitations: Secondary | ICD-10-CM | POA: Diagnosis not present

## 2017-12-20 ENCOUNTER — Ambulatory Visit: Payer: BLUE CROSS/BLUE SHIELD

## 2017-12-21 ENCOUNTER — Ambulatory Visit (INDEPENDENT_AMBULATORY_CARE_PROVIDER_SITE_OTHER): Payer: BLUE CROSS/BLUE SHIELD | Admitting: *Deleted

## 2017-12-21 ENCOUNTER — Ambulatory Visit
Admission: RE | Admit: 2017-12-21 | Discharge: 2017-12-21 | Disposition: A | Discharge status: Home / Self Care | Payer: BLUE CROSS/BLUE SHIELD | Source: Ambulatory Visit | Attending: Cardiovascular Disease | Admitting: Cardiovascular Disease

## 2017-12-21 VITALS — HR 112 | Resp 18 | Ht 64.0 in | Wt 142.0 lb

## 2017-12-21 DIAGNOSIS — R002 Palpitations: Secondary | ICD-10-CM | POA: Insufficient documentation

## 2017-12-21 DIAGNOSIS — R000 Tachycardia, unspecified: Secondary | ICD-10-CM

## 2017-12-21 DIAGNOSIS — R9431 Abnormal electrocardiogram [ECG] [EKG]: Secondary | ICD-10-CM

## 2017-12-21 NOTE — Progress Notes (Signed)
1.) Reason for visit: EKG check  2.) Name of MD requesting visit: Eula Listen PA  3.) H&P: Long history of tachycardia with shortness of breath  4.) ROS related to problem: Tachycardia persistent with wheezing and shortness of breath  5.) Assessment and plan per MD: Patient has appointment with Dr. Graciela Husbands 01/19/18 and return visit with Eula Listen PA on 01/27/18. Advised that I would have physician review EKG from today and that we would call if there are any further recommendations. She verbalized understanding with no further questions.

## 2017-12-21 NOTE — Addendum Note (Signed)
Addended by: Bryna Colander on: 12/21/2017 02:58 PM   Modules accepted: Orders

## 2017-12-21 NOTE — Progress Notes (Signed)
EKG shows sinus tachycardia, rates improved to the 110s. Given recent BP of 100/70 at her office visit, I am hesitant to titrate verapamil at this time. Continue with appointment with Dr. Graciela Husbands.

## 2018-01-02 ENCOUNTER — Other Ambulatory Visit: Payer: Self-pay | Admitting: Family Medicine

## 2018-01-02 DIAGNOSIS — F419 Anxiety disorder, unspecified: Principal | ICD-10-CM

## 2018-01-02 DIAGNOSIS — F329 Major depressive disorder, single episode, unspecified: Secondary | ICD-10-CM

## 2018-01-02 DIAGNOSIS — F32A Depression, unspecified: Secondary | ICD-10-CM

## 2018-01-03 ENCOUNTER — Encounter: Payer: Self-pay | Admitting: Family Medicine

## 2018-01-03 DIAGNOSIS — F329 Major depressive disorder, single episode, unspecified: Secondary | ICD-10-CM

## 2018-01-03 DIAGNOSIS — F32A Depression, unspecified: Secondary | ICD-10-CM

## 2018-01-03 DIAGNOSIS — F419 Anxiety disorder, unspecified: Principal | ICD-10-CM

## 2018-01-03 MED ORDER — DULOXETINE HCL 60 MG PO CPEP: 60.0000 mg | ORAL_CAPSULE | Freq: Every day | ORAL | 3 refills | Status: DC

## 2018-01-03 NOTE — Telephone Encounter (Signed)
Contacting patient to clarify current dose either 30 mg or 60 mg - contacted [facility], no new rx to be sent at this time, she has filled both within past several months.  Patient has not returned call and we have sent mychart message today to clarify which dose she will request at this time.   Last office visit in past she had increased up to 60mg  in 06/2017.  Saralyn Pilar, DO [facility] 01/03/2018, 12:27 PM

## 2018-01-03 NOTE — Telephone Encounter (Signed)
Contacting patient to clarify current dose either 30 mg or 60 mg - contacted [facility], no new rx to be sent at this time, she has filled both within past several months.  Patient has not returned call and we have sent mychart message today to clarify which dose she will request at this time.   Last office visit in past she had increased up to 60mg in 06/2017.  Alexander Karamalegos, DO [facility] 01/03/2018, 12:27 PM 

## 2018-01-14 ENCOUNTER — Other Ambulatory Visit: Payer: Self-pay | Admitting: Cardiovascular Disease

## 2018-01-17 ENCOUNTER — Other Ambulatory Visit: Payer: Self-pay | Admitting: Family Medicine

## 2018-01-17 DIAGNOSIS — Z1231 Encounter for screening mammogram for malignant neoplasm of breast: Secondary | ICD-10-CM

## 2018-01-18 ENCOUNTER — Encounter: Payer: Self-pay | Admitting: Family Medicine

## 2018-01-18 DIAGNOSIS — Z1211 Encounter for screening for malignant neoplasm of colon: Secondary | ICD-10-CM

## 2018-01-18 DIAGNOSIS — G4709 Other insomnia: Secondary | ICD-10-CM

## 2018-01-18 MED ORDER — ZOLPIDEM TARTRATE ER 6.25 MG PO TBCR: 6.2500 mg | EXTENDED_RELEASE_TABLET | Freq: Every evening | ORAL | 1 refills | Status: DC | PRN

## 2018-01-19 ENCOUNTER — Ambulatory Visit (INDEPENDENT_AMBULATORY_CARE_PROVIDER_SITE_OTHER): Payer: BLUE CROSS/BLUE SHIELD | Admitting: Internal Medicine

## 2018-01-19 ENCOUNTER — Other Ambulatory Visit: Payer: Self-pay | Admitting: Family Medicine

## 2018-01-19 ENCOUNTER — Encounter: Payer: Self-pay | Admitting: Internal Medicine

## 2018-01-19 VITALS — BP 110/68 | HR 108 | Ht 64.0 in | Wt 146.5 lb

## 2018-01-19 DIAGNOSIS — R000 Tachycardia, unspecified: Secondary | ICD-10-CM | POA: Diagnosis not present

## 2018-01-19 DIAGNOSIS — Z1211 Encounter for screening for malignant neoplasm of colon: Secondary | ICD-10-CM

## 2018-01-19 NOTE — Progress Notes (Signed)
ELECTROPHYSIOLOGY CONSULT NOTE  Patient ID: Christina Meza, MRN: [id number], DOB/AGE: 1966-02-16 52 y.o. Admit date: (Not on file) Date of Consult: 01/19/2018  Primary Physician: Smitty Cords, DO Primary Cardiologist: MA     Christina Meza is a 52 y.o. female who is being seen today for the evaluation of sinus tachycardia at the request of MA.    HPI Christina Meza is a 52 y.o. female  Who carries diagnosis of inappropriate sinus tachycardia made  About 10 yrs ago in [place].  She has a diagnosis of Lupus x about 15 yrs; among other complications she has raynauds.    Notes reviewed; she was treated with verapamil.  Initially she had been tried on labetalol; this caused significant shortness of breath and fatigue that she was diagnosed with secondary asthma.  With its abrupt discontinuation and initiation of verapamil she ended up in the hospital for 3 days tachycardia    She has exercise intolerance.  This is manifested by tachypalpitations of breath.  She does not have edema nocturnal dyspnea orthopnea.    DATE TEST EF   12/15 Echo  55%   1/18 Echo   50-55 % Ao root dilitation        DATE TEST    1/19 Holter Avg 101 ( nocturnal avg 90's)         Date Cr K TSH Hgb  1/19 0.82 4.4 1.626 13             Past Medical History:  Diagnosis Date  . Allergy   . Arthritis   . Asthma   . Clostridium difficile infection    2015x2 , 2016x1  . Depression   . Diastolic dysfunction    a. TTE 1/18: EF 50-55%, no RWMA, Gr1DD, mildly dilated aortic root of 3.4 cm, mildly dilated ascending aorta of 3.7 cm, mild mitral regurgitation, normal size left atrium, normal RV systolic function, PASP normal  . Genital warts   . GERD (gastroesophageal reflux disease)   . Inappropriate sinus tachycardia    a. 2010 Nuc Stress test: no ischemia/infarct;  b. 10/2013 Echo: Nl LV fxn, mild MR/TR.  . Migraine   . Palpitations   . Prolonged QT interval   . SLE (systemic  lupus erythematosus) (HCC)   . Stomach ulcer       Surgical History:  Past Surgical History:  Procedure Laterality Date  . BREAST BIOPSY Right 03/01/2017   u/s core bx neg ADENOSIS AND FOCAL MICROCYST FORMATION.  Marland Kitchen BREAST CYST ASPIRATION Left 4-5 yrs ago   NEG  . CATARACT EXTRACTION  2008  . ENDOSCOPIC VEIN LASER TREATMENT    . UMBILICAL HERNIA REPAIR    . vulvar excision for HPV  07/04/2014     Home Meds: Prior to Admission medications   Medication Sig Start Date End Date Taking? Authorizing Provider  acetaminophen (TYLENOL) 650 MG suppository Place 650 mg rectally every 4 (four) hours as needed.    [provider]  Belimumab 200 MG/ML SOAJ Inject 200 mg into the skin. 01/25/17   [provider]  Biotin 5000 MCG TABS Take by mouth.    [provider]  Cholecalciferol (VITAMIN D3) 3000 units TABS Take by mouth daily.    [provider]  diclofenac sodium (VOLTAREN) 1 % GEL Apply 1 application topically as needed.    [provider]  DULoxetine (CYMBALTA) 60 MG capsule Take 1 capsule (60 mg total) by mouth daily. 01/03/18   Saralyn Pilar  J, DO  hydroxychloroquine (PLAQUENIL) 200 MG tablet Take 200 mg by mouth 2 (two) times daily.     [provider]  levalbuterol (XOPENEX HFA) 45 MCG/ACT inhaler Inhale 1 puff into the lungs every 4 (four) hours as needed for wheezing. Reported on 03/14/2016    [provider]  magnesium oxide (MAG-OX) 400 MG tablet Take 1 tablet (400 mg total) by mouth daily. 12/14/17   Dunn, Raymon Mutton, PA-C  mometasone [facility]) 220 MCG/INH inhaler Inhale 2 puffs into the lungs daily.    [provider]  Multiple Vitamins-Minerals (MULTIVITAMIN WITH MINERALS) tablet Take 1 tablet by mouth every morning.     [provider]  naproxen sodium (ALEVE) 220 MG tablet Take 1 tablet (220 mg total) by mouth at bedtime. 08/10/16   Karamalegos, Netta Neat, DO  ondansetron (ZOFRAN) 4 MG tablet Take 1  tablet (4 mg total) by mouth every 8 (eight) hours as needed for nausea or vomiting. 05/21/16   Tommie Sams, DO  pantoprazole (PROTONIX) 40 MG tablet Take 1 tablet (40 mg total) by mouth daily. 06/20/17   Karamalegos, Netta Neat, DO  predniSONE (DELTASONE) 5 MG tablet Take 7 mg by mouth daily with breakfast.     [provider]  Probiotic Product (PROBIOTIC DAILY PO) Take 1 tablet by mouth daily.     [provider]  triamcinolone cream (KENALOG) 0.5 % Apply 1 application topically 2 (two) times daily. To affected areas, for up to 2 weeks. 03/16/17   Karamalegos, Netta Neat, DO  valACYclovir (VALTREX) 500 MG tablet Take 500 mg by mouth at bedtime.    [provider]  verapamil (CALAN-SR) 120 MG CR tablet Take 1 tablet (120 mg total) by mouth at bedtime. 11/12/16   Creig Hines, NP  verapamil (CALAN-SR) 120 MG CR tablet TAKE 1 TABLET AT BEDTIME. 01/16/18   Creig Hines, NP  vitamin E 400 UNIT capsule Take 400 Units by mouth daily.    [provider]  zolpidem (AMBIEN CR) 6.25 MG CR tablet Take 1 tablet (6.25 mg total) by mouth at bedtime as needed for sleep. 01/18/18   Smitty Cords, DO    Allergies:  Allergies  Allergen Reactions  . Lansoprazole Swelling and Hives    Lips and tongue  . Celebrex [Celecoxib] Other (See Comments) and Itching    Recommended not taking due to a stomach issue.   . Sulfa Antibiotics Itching and Hives  . Sulfamethoxazole-Trimethoprim Itching and Hives  . Furosemide Itching and Hives    Social History   Socioeconomic History  . Marital status: Married    Spouse name: Not on file  . Number of children: Not on file  . Years of education: Not on file  . Highest education level: Not on file  Social Needs  . Financial resource strain: Not on file  . Food insecurity - worry: Not on file  . Food insecurity - inability: Not on file  . Transportation needs - medical: Not on file  .  Transportation needs - non-medical: Not on file  Occupational History  . Not on file  Tobacco Use  . Smoking status: Never Smoker  . Smokeless tobacco: Never Used  Substance and Sexual Activity  . Alcohol use: Yes    Alcohol/week: 0.0 - 0.6 oz  . Drug use: No  . Sexual activity: Yes    Partners: Male  Other Topics Concern  . 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     Family History  Problem Relation Age of Onset  . Arthritis Mother   . Bipolar disorder Mother   . Depression Mother   . Mental illness Mother   . Hypertension Maternal Grandmother   . Hyperlipidemia Maternal Grandfather   . Heart disease Maternal Grandfather   . Stroke Maternal Grandfather   . Hypertension Maternal Grandfather   . Colon cancer Paternal Grandfather   . Breast cancer Neg Hx      ROS:  Please see the history of present illness.   All other systems reviewed and negative.    Physical Exam: Blood pressure 110/68, pulse (!) 108, height 5\' 4"  (1.626 m), weight 146 lb 8 oz (66.5 kg), last menstrual period 07/15/2015. General: Well developed, well nourished female in no acute distress. Head: Normocephalic, atraumatic, sclera non-icteric, no xanthomas, nares are without discharge. EENT: normal  Lymph Nodes:  none Neck: Negative for carotid bruits. JVD not elevated. Back:without scoliosis kyphosis Lungs: Clear bilaterally to auscultation without wheezes, rales, or rhonchi. Breathing is unlabored. Heart: RRR with S1 S2. No  murmur . No rubs, or gallops appreciated. Abdomen: Soft, non-tender, non-distended with normoactive bowel sounds. No hepatomegaly. No rebound/guarding. No obvious abdominal masses. Msk:  Strength and tone appear normal for age. Extremities: No clubbing or cyanosis. No edema.  Distal pedal pulses are 2+ and equal bilaterally. Skin: Warm and Dry Neuro: Alert and oriented X 3. CN III-XII intact Grossly normal sensory and motor function . Psych:  Responds to questions  appropriately with a normal affect.      Labs: Cardiac Enzymes No results for input(s): CKTOTAL, CKMB, TROPONINI in the last 72 hours. CBC Lab Results  Component Value Date   WBC 3.6 12/13/2017   HGB 13.0 12/13/2017   HCT 39.8 12/13/2017   MCV 96.0 12/13/2017   PLT 211 12/13/2017   PROTIME: No results for input(s): LABPROT, INR in the last 72 hours. Chemistry No results for input(s): NA, K, CL, CO2, BUN, CREATININE, CALCIUM, PROT, BILITOT, ALKPHOS, ALT, AST, GLUCOSE in the last 168 hours.  Invalid input(s): LABALBU Lipids Lab Results  Component Value Date   CHOL 172 05/04/2017   HDL 46 (L) 05/04/2017   LDLCALC 93 05/04/2017   TRIG 163 (H) 05/04/2017   BNP No results found for: PROBNP Thyroid Function Tests: No results for input(s): TSH, T4TOTAL, T3FREE, THYROIDAB in the last 72 hours.  Invalid input(s): FREET3 Miscellaneous No results found for: DDIMER  Radiology/Studies:  No results found.  EKG: Sinus rhythm at 101  Holter was reviewed personally as noted above  Assessment and Plan:   Sinus tachycardia-Inappropriate  Lupus  Raynaud's phenomenon  Adult onset asthma  Stress    Patient has symptomatic tachycardia which almost certainly is sinus based on variations in RR interval with respiration P wave morphologies.  It has been present for more than a decade.  LV function remains stable not withstanding the relatively high nadir of heart rates protected at night-90s.  We have reviewed the physiology of the sinus node.  Do not know of data related to the use of verapamil for sinus slowing not withstanding the fact that the funny current is mediated by calcium.  Hence, I have suggested that we consider a selective beta-blocker, noting that she had not tolerated the nonselective beta-blocker, labetalol.  She is to see her rheumatologist today and we will ask for their input.  Is further complicated by her Raynaud's phenomenon which can be quite serious.  I  wonder whether she might not benefit from adjunctive calcium blockers if we in fact try a beta-blocker for her IST.  She is under a great deal of stress with the caring of her mother.  Encouraged her to try to address these issues as they are clearly having a toll on her physical and psychological well-being     Sherryl Manges

## 2018-01-19 NOTE — Patient Instructions (Signed)
Medication Instructions: - Your physician recommends that you continue on your current medications as directed. Please refer to the Current Medication list given to you today.  Labwork: - none ordered  Procedures/Testing: - none ordered  Follow-Up: - pending   Any Additional Special Instructions Will Be Listed Below (If Applicable). - Dr. Graciela Husbands can make further decisions after feedback from your rheumatology visit today regarding a beta blocker    If you need a refill on your cardiac medications before your next appointment, please call your pharmacy.

## 2018-01-20 ENCOUNTER — Encounter: Payer: Self-pay | Admitting: Internal Medicine

## 2018-01-25 ENCOUNTER — Encounter: Payer: Self-pay | Admitting: Internal Medicine

## 2018-01-25 MED ORDER — VERAPAMIL HCL 40 MG PO TABS: ORAL_TABLET | ORAL | 6 refills | Status: DC

## 2018-01-25 MED ORDER — METOPROLOL TARTRATE 25 MG PO TABS: 25.0000 mg | ORAL_TABLET | Freq: Two times a day (BID) | ORAL | 3 refills | Status: DC

## 2018-01-25 MED ORDER — METOPROLOL TARTRATE 25 MG PO TABS: 25.0000 mg | ORAL_TABLET | Freq: Two times a day (BID) | ORAL | 0 refills | Status: DC

## 2018-01-25 NOTE — Telephone Encounter (Signed)
Per previous Mychart message:  [name], RN  to Christina Meza     4:33 PM  [name],   Per Dr. Graciela Husbands, we can try you on metoprolol tartrate 25 mg twice daily and have you stop the daily verapamil that you are taking. He did say we could give you a prescription for verapamil 40 mg to take every 8 hours as needed for your raynauds. He did not mention anything about the magnesium supplement, but I will follow up with him about that. If you can just confirm your pharmacy for me, I will send in the metoprolol and as needed verapamil prescriptions.       I have dc'd daily verapamil.  Ordered metoprolol tartrate 25 mg twice daily to [facility] and verapmil 40 every 8 hrs as [facility]  Also [facility] day [facility] to [facility] so that pt can start making the changes now, instead of waiting for mail order.

## 2018-01-26 ENCOUNTER — Encounter: Payer: Self-pay | Admitting: *Deleted

## 2018-01-27 ENCOUNTER — Ambulatory Visit: Payer: BLUE CROSS/BLUE SHIELD | Admitting: Physician Assistant

## 2018-02-17 ENCOUNTER — Ambulatory Visit
Admission: RE | Admit: 2018-02-17 | Discharge: 2018-02-17 | Disposition: A | Discharge status: Home / Self Care | Payer: BLUE CROSS/BLUE SHIELD | Source: Ambulatory Visit | Attending: Family Medicine | Admitting: Family Medicine

## 2018-02-17 DIAGNOSIS — Z1231 Encounter for screening mammogram for malignant neoplasm of breast: Secondary | ICD-10-CM | POA: Diagnosis not present

## 2018-02-22 ENCOUNTER — Other Ambulatory Visit: Payer: Self-pay | Admitting: Internal Medicine

## 2018-03-16 ENCOUNTER — Encounter (INDEPENDENT_AMBULATORY_CARE_PROVIDER_SITE_OTHER): Payer: Self-pay

## 2018-04-24 ENCOUNTER — Telehealth: Payer: Self-pay | Admitting: Nurse Practitioner

## 2018-04-24 MED ORDER — VERAPAMIL HCL 40 MG PO TABS: ORAL_TABLET | ORAL | 3 refills | Status: DC

## 2018-04-24 NOTE — Telephone Encounter (Signed)
Please verify if patient should continue getting refills from Dr. Graciela Husbands for Verapamil being she is taking it apparently for Raynaud's.

## 2018-04-24 NOTE — Telephone Encounter (Signed)
OK to refill for-  Verapamil 40 mg - take 1 tablet every 8 hours as needed for Raynauds

## 2018-04-24 NOTE — Telephone Encounter (Signed)
°*  STAT* If patient is at the pharmacy, call can be transferred to refill team.   1. Which medications need to be refilled? (please list name of each medication and dose if known)  Verapamil   2. Which pharmacy/location (including street and city if local pharmacy) is medication to be sent to? [facility]   3. Do they need a 30 day or 90 day supply? 90 day

## 2018-05-26 LAB — HM COLONOSCOPY

## 2018-06-13 ENCOUNTER — Other Ambulatory Visit: Payer: Self-pay | Admitting: Family Medicine

## 2018-06-13 DIAGNOSIS — K219 Gastro-esophageal reflux disease without esophagitis: Secondary | ICD-10-CM

## 2018-07-25 ENCOUNTER — Other Ambulatory Visit: Payer: Self-pay | Admitting: Internal Medicine

## 2018-08-16 ENCOUNTER — Other Ambulatory Visit: Payer: Self-pay | Admitting: Internal Medicine

## 2018-10-19 ENCOUNTER — Encounter: Payer: Self-pay | Admitting: Family Medicine

## 2018-10-19 ENCOUNTER — Ambulatory Visit (INDEPENDENT_AMBULATORY_CARE_PROVIDER_SITE_OTHER): Payer: BLUE CROSS/BLUE SHIELD | Admitting: Family Medicine

## 2018-10-19 VITALS — BP 110/60 | HR 117 | Temp 99.3°F | Resp 15 | Ht 64.0 in | Wt 149.2 lb

## 2018-10-19 DIAGNOSIS — J0100 Acute maxillary sinusitis, unspecified: Secondary | ICD-10-CM | POA: Diagnosis not present

## 2018-10-19 DIAGNOSIS — J029 Acute pharyngitis, unspecified: Secondary | ICD-10-CM

## 2018-10-19 DIAGNOSIS — R52 Pain, unspecified: Secondary | ICD-10-CM

## 2018-10-19 LAB — POCT RAPID STREP A (OFFICE): Rapid Strep A Screen: NEGATIVE

## 2018-10-19 LAB — POCT INFLUENZA A/B
INFLUENZA B, POC: NEGATIVE
Influenza A, POC: NEGATIVE

## 2018-10-19 MED ORDER — AZITHROMYCIN 250 MG PO TABS: ORAL_TABLET | ORAL | 0 refills | Status: DC

## 2018-10-19 MED ORDER — IPRATROPIUM BROMIDE 0.06 % NA SOLN: 2.0000 | Freq: Four times a day (QID) | NASAL | 0 refills | Status: DC

## 2018-10-19 MED ORDER — BENZONATATE 100 MG PO CAPS: 100.0000 mg | ORAL_CAPSULE | Freq: Three times a day (TID) | ORAL | 0 refills | Status: DC | PRN

## 2018-10-19 NOTE — Patient Instructions (Addendum)
Thank you for coming to the office today.  1. It sounds like you have a Sinusitis (Bacterial Infection) - this most likely started as an Upper Respiratory Virus that has settled into an infection.  Start Azithromycin Z pak (antibiotic) 2 tabs day 1, then 1 tab x 4 days, complete entire course even if improved Start Atrovent nasal spray decongestant 2 sprays in each nostril up to 4 times daily for 7 days Start Tessalon Perls take 1 capsule up to 3 times a day as needed for cough May try OTC Mucinex (regular) up to 7-10 days then stop Continue Tylenol, DayQuil, NyQuil  Can try OTC Sudafed  - Recommend to keep using Nasal Saline spray multiple times a day to help flush out congestion and clear sinuses - Improve hydration by drinking plenty of clear fluids (water, gatorade) to reduce secretions and thin congestion - Congestion draining down throat can cause irritation. May try warm herbal tea with honey, cough drops - Can take Tylenol or Ibuprofen as needed for fevers - May continue over the counter cold medicine as you are, I would not use any decongestant or mucinex longer than 7 days.  If not improved - consider return for Chest X-ray or Urgent Care  FLU TEST and STREP were NEGATIVE  If you develop persistent fever >101F for at least 3 consecutive days, headaches with sinus pain or pressure or persistent earache, please schedule a follow-up evaluation within next few days to week.   Please schedule a Follow-up Appointment to: Return in about 1 week (around 10/26/2018), or if symptoms worsen or fail to improve, for Sinus.  If you have any other questions or concerns, please feel free to call the office or send a message through MyChart. You may also schedule an earlier appointment if necessary.  Additionally, you may be receiving a survey about your experience at our office within a few days to 1 week by e-mail or mail. We value your feedback.  Saralyn Pilar, DO [facility], [place]

## 2018-10-19 NOTE — Progress Notes (Signed)
Subjective:    Patient ID: Christina Meza, female    DOB: Aug 14, 1966, 52 y.o.   MRN: [id number]  Delbert Vu is a 52 y.o. female presenting on 10/19/2018 for Sore Throat and Fever  Patient presents for a same day appointment.  HPI   ACUTE SINUSITIS / FLU-LIKE ILLNESS Reports onset severe sore throat for 5 days, radiates down into neck. Difficulty with coughing, feels like if she lay down some worse coughing breathing. Sit up in recliner at night to help with sinus drainage. Productive cough, thicker sputum Tried Tylenol, DayQuil and NyQuil some relief No known sick contact Admits fevers, headache, sinus pain and pressure Denies any new muscle aches or body aches, has chronic issues with body aches  Health Maintenance: UTD Flu Vaccine 09/19/18  Depression screen [facility] 2/9 05/06/2017 09/22/2016 08/10/2016  Decreased Interest 0 0 1  Down, Depressed, Hopeless 0 1 1  PHQ - 2 Score 0 1 2  Altered sleeping 2 3 3   Tired, decreased energy 3 3 1   Change in appetite 1 1 0  Feeling bad or failure about yourself  0 0 0  Trouble concentrating 0 1 1  Moving slowly or fidgety/restless 1 0 0  Suicidal thoughts 0 0 0  PHQ-9 Score 7 9 7   Difficult doing work/chores - Not difficult at all Not difficult at all    Social History   Tobacco Use  . Smoking status: Never Smoker  . Smokeless tobacco: Never Used  Substance Use Topics  . Alcohol use: Yes    Alcohol/week: 0.0 - 1.0 standard drinks  . Drug use: No    Review of Systems Per HPI unless specifically indicated above     Objective:    BP 110/60 (BP Location: Left Arm, Patient Position: Sitting, Cuff Size: Normal)   Pulse (!) 117   Temp 99.3 F (37.4 C)   Resp 15   Ht 5\' 4"  (1.626 m)   Wt 149 lb 3.2 oz (67.7 kg)   LMP 07/15/2015 (Within Days)   SpO2 100%   BMI 25.61 kg/m   Wt Readings from Last 3 Encounters:  10/19/18 149 lb 3.2 oz (67.7 kg)  01/19/18 146 lb 8 oz (66.5 kg)  12/21/17 142 lb (64.4 kg)    Physical  Exam  Constitutional: She is oriented to person, place, and time. She appears well-developed and well-nourished. No distress.  Moderately ill and tired appearing, cooperative  HENT:  Head: Normocephalic and atraumatic.  Mouth/Throat: Oropharynx is clear and moist.  maxillary sinuses bilateral tender. Nares with turbinate edema and congestion. Bilateral TMs clear without erythema, effusion or bulging. Oropharynx with non specific erythema without exudates, edema or asymmetry.  Hoarse voice  Eyes: Conjunctivae are normal. Right eye exhibits no discharge. Left eye exhibits no discharge.  Neck: Normal range of motion. Neck supple. No thyromegaly present.  Cardiovascular: Regular rhythm, normal heart sounds and intact distal pulses.  No murmur heard. Tachycardic  Pulmonary/Chest: Effort normal. No respiratory distress. She has no wheezes.  Mild coarse rhonchi breath sounds bilateral lower lung bases, some clear with cough, no focal crackle. No wheezing. Good air movement. Occasional coughing.  Musculoskeletal: Normal range of motion. She exhibits no edema.  Lymphadenopathy:    She has no cervical adenopathy.  Neurological: She is alert and oriented to person, place, and time.  Skin: Skin is warm and dry. No rash noted. She is not diaphoretic. No erythema.  Psychiatric: She has a normal mood and affect. Her behavior is normal.  Well groomed, good eye contact, normal speech and thoughts  Nursing note and vitals reviewed.  Results for orders placed or performed in visit on 10/19/18  POCT rapid strep A  Result Value Ref Range   Rapid Strep A Screen Negative Negative  POCT Influenza A/B  Result Value Ref Range   Influenza A, POC Negative Negative   Influenza B, POC Negative Negative      Assessment & Plan:   Problem List Items Addressed This Visit    None    Visit Diagnoses    Acute non-recurrent maxillary sinusitis    -  Primary   Relevant Medications   azithromycin (ZITHROMAX  Z-PAK) 250 MG tablet   benzonatate (TESSALON) 100 MG capsule   ipratropium (ATROVENT) 0.06 % nasal spray   Sore throat       Relevant Orders   POCT rapid strep A (Completed)   Body aches       Relevant Orders   POCT Influenza A/B (Completed)      Consistent with acute maxillary sinusitis, likely initially viral URI vs viral syndrome, considered possible flu, however generalized symptoms sound less likely, already had flu shot, and seems to be very focal with sinuses at this time, given history of SLE will cover for possible developing bacterial infection.  Rapid Flu / Strep = NEGATIVE Considered empiric flu coverage/clinical diagnosis, but given day 5 now less likely to benefit.  Plan: 1. Start Azithromycin Z pak (antibiotic) 2 tabs day 1, then 1 tab x 4 days, complete entire course even if improved 2. Start Atrovent nasal spray decongestant 2 sprays in each nostril up to 4 times daily for 7 days 3. Start Tessalon Perls take 1 capsule up to 3 times a day as needed for cough 4. May try OTC Mucinex up to 7-10 days then stop 5. Continue OTC meds, improve hydration 6. Note for work 2 days 7. Return criteria reviewed - if not improving should return for chest x-ray / may go to urgent care or ED if acute worsening   Meds ordered this encounter  Medications  . azithromycin (ZITHROMAX Z-PAK) 250 MG tablet    Sig: Take 2 tabs (500mg  total) on Day 1. Take 1 tab (250mg ) daily for next 4 days.    Dispense:  6 tablet    Refill:  0  . benzonatate (TESSALON) 100 MG capsule    Sig: Take 1 capsule (100 mg total) by mouth 3 (three) times daily as needed for cough.    Dispense:  30 capsule    Refill:  0  . ipratropium (ATROVENT) 0.06 % nasal spray    Sig: Place 2 sprays into both nostrils 4 (four) times daily. For up to 5-7 days then stop.    Dispense:  15 mL    Refill:  0    Follow up plan: Return in about 1 week (around 10/26/2018), or if symptoms worsen or fail to improve, for  Sinus.  Saralyn Pilar, DO [facility] 10/19/2018, 11:56 AM

## 2018-11-10 ENCOUNTER — Other Ambulatory Visit: Payer: Self-pay | Admitting: Family Medicine

## 2018-11-10 DIAGNOSIS — J0100 Acute maxillary sinusitis, unspecified: Secondary | ICD-10-CM

## 2019-01-14 ENCOUNTER — Other Ambulatory Visit: Payer: Self-pay | Admitting: Family Medicine

## 2019-01-14 DIAGNOSIS — F32A Depression, unspecified: Secondary | ICD-10-CM

## 2019-01-14 DIAGNOSIS — F329 Major depressive disorder, single episode, unspecified: Secondary | ICD-10-CM

## 2019-01-14 DIAGNOSIS — F419 Anxiety disorder, unspecified: Principal | ICD-10-CM

## 2019-01-20 ENCOUNTER — Other Ambulatory Visit: Payer: Self-pay | Admitting: Internal Medicine

## 2019-01-22 NOTE — Telephone Encounter (Signed)
Please advise if ok to refill Metoprolol tartrate 25 mg tablet bid. Pt hasn't seen Graciela Husbands in 1 yr please advise due to last OV note f/u pending.

## 2019-01-22 NOTE — Telephone Encounter (Signed)
Disregard NL forward intended for [facility] Triage.

## 2019-01-29 ENCOUNTER — Other Ambulatory Visit: Payer: Self-pay | Admitting: Family Medicine

## 2019-01-29 DIAGNOSIS — Z1231 Encounter for screening mammogram for malignant neoplasm of breast: Secondary | ICD-10-CM

## 2019-02-13 IMAGING — MG MM BREAST BX W/ LOC DEV 1ST LESION IMAGE BX SPEC STEREO GUIDE*R*
8 of 18 series · 8 of 34 positions shown · non-contrast
Comparison: Previous exams.

ADDENDUM:
Pathology of the right breast biopsy revealed RIGHT BREAST, UPPER
OUTER QUADRANT; BIOPSY: ADENOSIS AND FOCAL MICROCYST FORMATION.
NEGATIVE FOR ATYPIA AND MALIGNANCY. This was found to be concordant
by Dr. Bitar.

Recommendation:  Six month follow-up diagnostic right mammogram.
At the patient's request, pathology and recommendations were relayed
to the patient by phone. She stated she has done well following the
biopsy with no bleeding, bruising, or hematoma. Post biopsy
instructions were reviewed with the patient. All of her questions
were answered. She was encouraged to call the [HOSPITAL]
with any further questions or concerns.
Results and recommendations relayed by Mauricio, Shina on 03/02/17.
CLINICAL DATA: 50-year-old female presenting for stereotactic
biopsy of right breast distortion.
EXAM:
RIGHT BREAST STEREOTACTIC CORE NEEDLE BIOPSY

[R (1 of 8)]
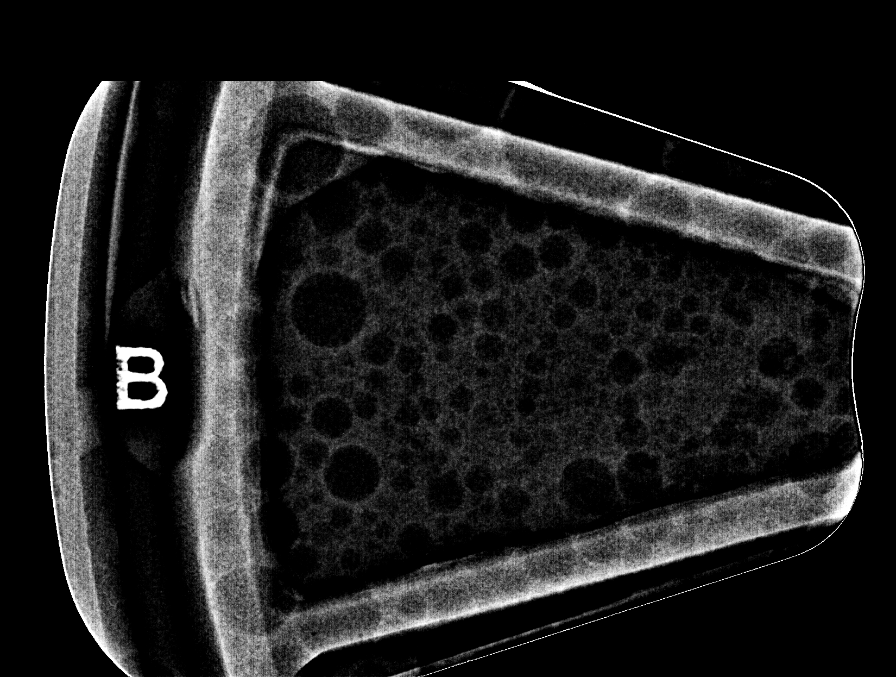

[R (2 of 8)]
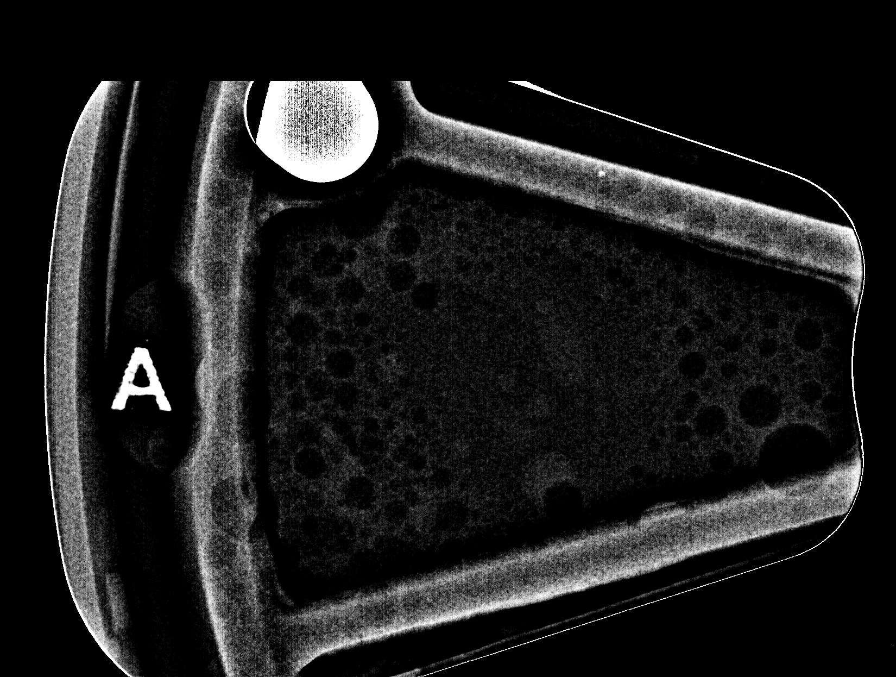

[R (3 of 8)]
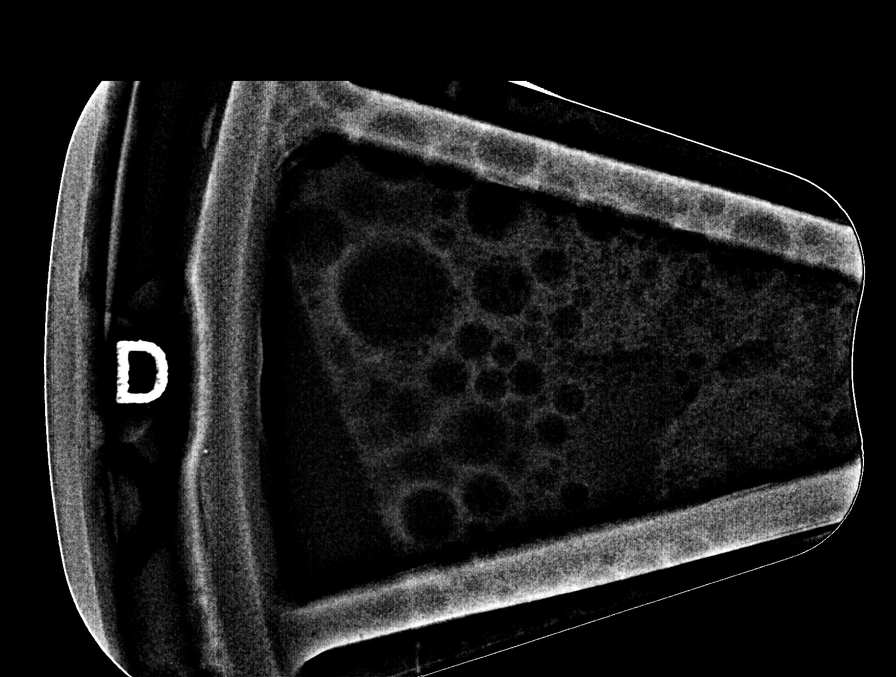

[R (4 of 8)]
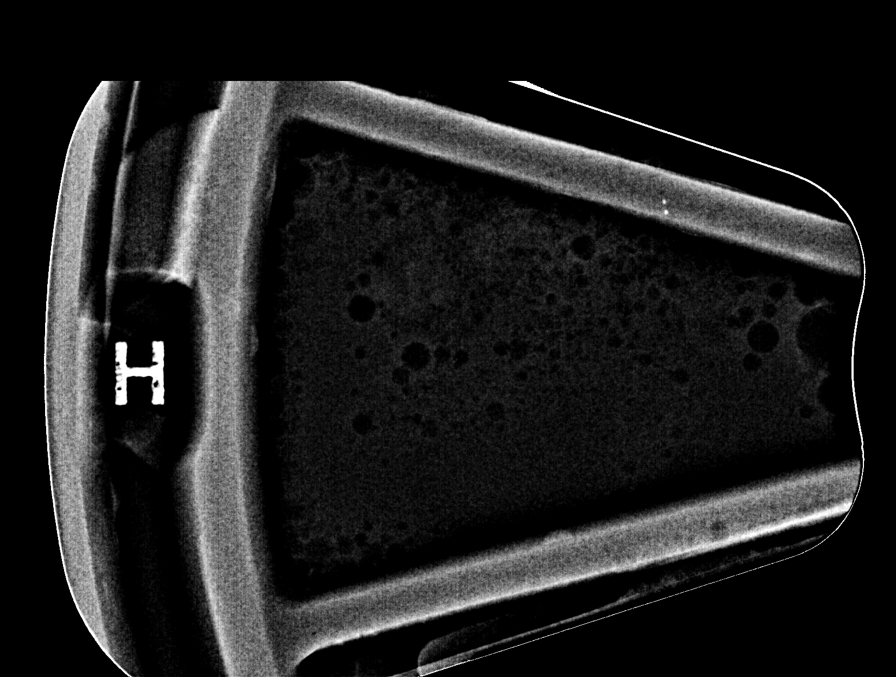

[R (5 of 8)]
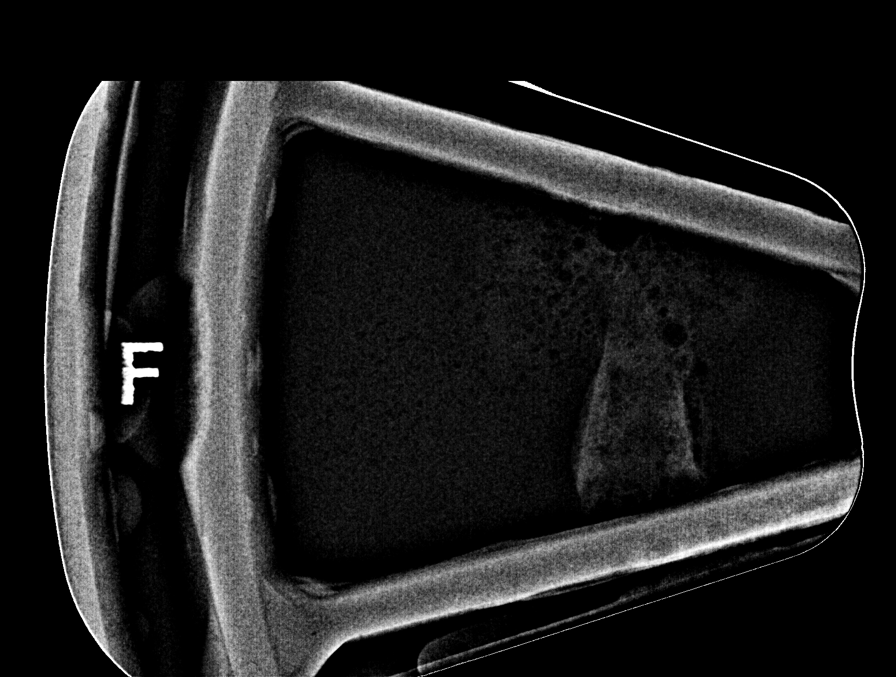

[R (6 of 8)]
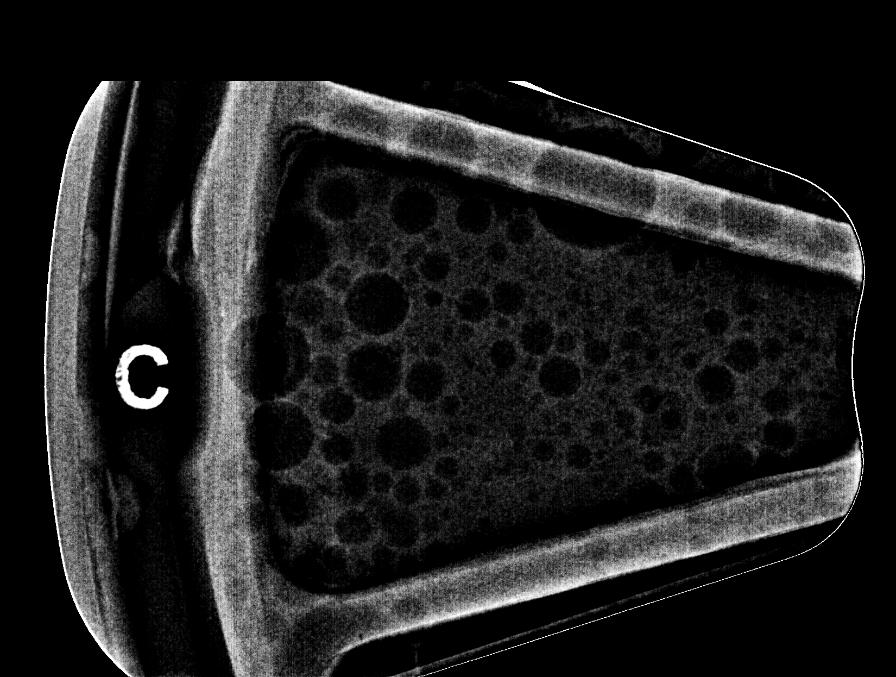

[R (7 of 8)]
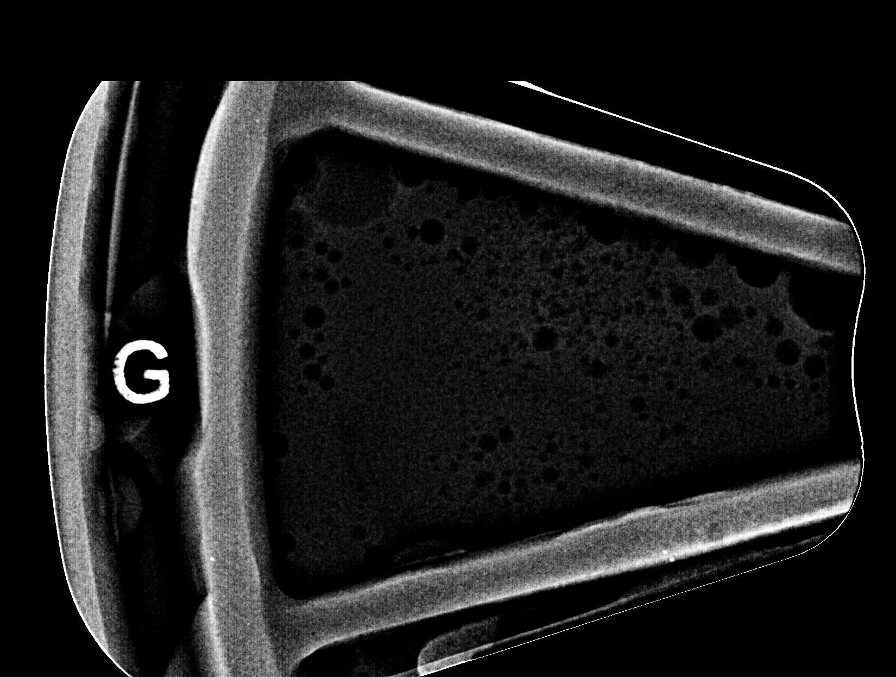

[R (8 of 8)]
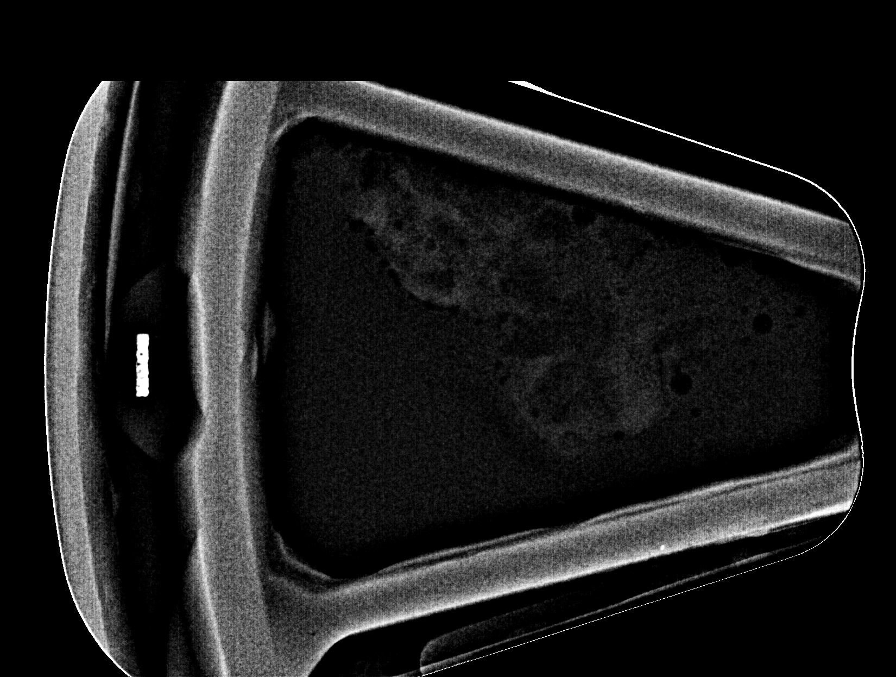

[8 of 34 positions shown; findings below may reference images not displayed]



Using sterile technique and 1% Lidocaine as local anesthetic, under
stereotactic guidance, a 9 gauge vacuum assisted device was used to
perform core needle biopsy of possible distortion in the upper-outer
right breast using a superior approach.

At the conclusion of the procedure, a top hat shaped tissue marker
clip was deployed into the biopsy cavity. Follow-up 2-view mammogram
was performed and dictated separately.
IMPRESSION: Stereotactic-guided biopsy of possible right breast distortion. No
apparent complications.

## 2019-02-22 ENCOUNTER — Encounter: Payer: Self-pay | Admitting: Family Medicine

## 2019-02-22 NOTE — Progress Notes (Signed)
See scanned copy of Serious Health Condition Leave Forms, updated, last completed 03/2018.  Saralyn Pilar, DO [facility] 02/22/2019, 1:59 PM

## 2019-03-07 ENCOUNTER — Encounter: Payer: Self-pay | Admitting: Family Medicine

## 2019-03-07 ENCOUNTER — Other Ambulatory Visit: Payer: Self-pay | Admitting: Family Medicine

## 2019-03-07 NOTE — Progress Notes (Signed)
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 We are not seeing patients in office due to safety restrictions, she was not seen for this.  1. Date your patient reported stopping work: 03/05/19 2. Date of Disability: 03/05/19 3. Expected Return to Work Date: 05/21/19 (approx) - duration 11 weeks 4. Date you first treated this patient: N/A (for unrelated condition)  Has surgery been performed? [x]  No Is surgery planned? [x]  No Was patient hospitalized for this condition? [x]  No  Has patient been referred to any other physician? [x]  No  ---------------------- Restrictions / Limitations: - NONE  Expected duration of any restrictions/limitations: 11 weeks (approx)  Completed, signed, and dated Short Term Disability paperwork on 03/07/19. To be scanned into chart and submitted via fax (to [phone] to ATTN Mikayla Hickman  Saralyn Pilar, DO [facility] 03/07/2019, 12:58 PM

## 2019-03-13 ENCOUNTER — Other Ambulatory Visit: Payer: Self-pay | Admitting: Family Medicine

## 2019-03-13 ENCOUNTER — Other Ambulatory Visit: Payer: Self-pay

## 2019-03-13 DIAGNOSIS — J4540 Moderate persistent asthma, uncomplicated: Secondary | ICD-10-CM

## 2019-03-13 MED ORDER — MOMETASONE FUROATE 220 MCG/INH IN AEPB: 2.0000 | INHALATION_SPRAY | Freq: Every day | RESPIRATORY_TRACT | 3 refills | Status: DC

## 2019-05-09 ENCOUNTER — Telehealth: Payer: Self-pay | Admitting: Internal Medicine

## 2019-05-09 NOTE — Telephone Encounter (Signed)
Virtual Visit Pre-Appointment Phone Call  "(Name), I am calling you today to discuss your upcoming appointment. We are currently trying to limit exposure to the virus that causes COVID-19 by seeing patients at home rather than in the office."  1. "What is the BEST phone number to call the day of the visit?" - include this in appointment notes  2. "Do you have or have access to (through a family member/friend) a smartphone with video capability that we can use for your visit?" a. If yes - list this number in appt notes as "cell" (if different from BEST phone #) and list the appointment type as a VIDEO visit in appointment notes b. If no - list the appointment type as a PHONE visit in appointment notes  3. Confirm consent - "In the setting of the current Covid19 crisis, you are scheduled for a (phone or video) visit with your provider on (date) at (time).  Just as we do with many in-office visits, in order for you to participate in this visit, we must obtain consent.  If you'd like, I can send this to your mychart (if signed up) or email for you to review.  Otherwise, I can obtain your verbal consent now.  All virtual visits are billed to your insurance company just like a normal visit would be.  By agreeing to a virtual visit, we'd like you to understand that the technology does not allow for your provider to perform an examination, and thus may limit your provider's ability to fully assess your condition. If your provider identifies any concerns that need to be evaluated in person, we will make arrangements to do so.  Finally, though the technology is pretty good, we cannot assure that it will always work on either your or our end, and in the setting of a video visit, we may have to convert it to a phone-only visit.  In either situation, we cannot ensure that we have a secure connection.  Are you willing to proceed?" STAFF: Did the patient verbally acknowledge consent to telehealth visit? Document  YES/NO here: YES  4. Advise patient to be prepared - "Two hours prior to your appointment, go ahead and check your blood pressure, pulse, oxygen saturation, and your weight (if you have the equipment to check those) and write them all down. When your visit starts, your provider will ask you for this information. If you have an Apple Watch or Kardia device, please plan to have heart rate information ready on the day of your appointment. Please have a pen and paper handy nearby the day of the visit as well."  5. Give patient instructions for MyChart download to smartphone OR Doximity/Doxy.me as below if video visit (depending on what platform provider is using)  6. Inform patient they will receive a phone call 15 minutes prior to their appointment time (may be from unknown caller ID) so they should be prepared to answer    TELEPHONE CALL NOTE  Christina Meza has been deemed a candidate for a follow-up tele-health visit to limit community exposure during the Covid-19 pandemic. I spoke with the patient via phone to ensure availability of phone/video source, confirm preferred email & phone number, and discuss instructions and expectations.  I reminded Christina Meza to be prepared with any vital sign and/or heart rhythm information that could potentially be obtained via home monitoring, at the time of her visit. I reminded Christina Meza to expect a phone call prior to her visit.  Christina Meza 05/09/2019 9:56 AM   INSTRUCTIONS FOR DOWNLOADING THE MYCHART APP TO SMARTPHONE  - The patient must first make sure to have activated MyChart and know their login information - If Apple, go to [employer] and type in MyChart in the search bar and download the app. If Android, ask patient to go to [employer] and type in [place] in the search bar and download the app. The app is free but as with any other app downloads, their phone may require them to verify saved payment information or  Apple/Android password.  - The patient will need to then log into the app with their MyChart username and password, and select [facility] as their healthcare provider to link the account. When it is time for your visit, go to the MyChart app, find appointments, and click Begin Video Visit. Be sure to Select Allow for your device to access the Microphone and Camera for your visit. You will then be connected, and your provider will be with you shortly.  **If they have any issues connecting, or need assistance please contact MyChart service desk [phone])**  **If using a computer, in order to ensure the best quality for their visit they will need to use either of the following Internet Browsers: [employer] or Google Chrome**  IF USING DOXIMITY or DOXY.ME - The patient will receive a link just prior to their visit by text.     FULL LENGTH CONSENT FOR TELE-HEALTH VISIT   I hereby voluntarily request, consent and authorize [facility] and its employed or contracted physicians, physician assistants, nurse practitioners or other licensed health care professionals (the Practitioner), to provide me with telemedicine health care services (the "Services") as deemed necessary by the treating Practitioner. I acknowledge and consent to receive the Services by the Practitioner via telemedicine. I understand that the telemedicine visit will involve communicating with the Practitioner through live audiovisual communication technology and the disclosure of certain medical information by electronic transmission. I acknowledge that I have been given the opportunity to request an in-person assessment or other available alternative prior to the telemedicine visit and am voluntarily participating in the telemedicine visit.  I understand that I have the right to withhold or withdraw my consent to the use of telemedicine in the course of my care at any time, without affecting my right to future care  or treatment, and that the Practitioner or I may terminate the telemedicine visit at any time. I understand that I have the right to inspect all information obtained and/or recorded in the course of the telemedicine visit and may receive copies of available information for a reasonable fee.  I understand that some of the potential risks of receiving the Services via telemedicine include:  Marland Kitchen Delay or interruption in medical evaluation due to technological equipment failure or disruption; . Information transmitted may not be sufficient (e.g. poor resolution of images) to allow for appropriate medical decision making by the Practitioner; and/or  . In rare instances, security protocols could fail, causing a breach of personal health information.  Furthermore, I acknowledge that it is my responsibility to provide information about my medical history, conditions and care that is complete and accurate to the best of my ability. I acknowledge that Practitioner's advice, recommendations, and/or decision may be based on factors not within their control, such as incomplete or inaccurate data provided by me or distortions of diagnostic images or specimens that may result from electronic transmissions. I understand that the practice  of medicine is not an Chief Strategy Officer and that Practitioner makes no warranties or guarantees regarding treatment outcomes. I acknowledge that I will receive a copy of this consent concurrently upon execution via email to the email address I last provided but may also request a printed copy by calling the office of [place].    I understand that my insurance will be billed for this visit.   I have read or had this consent read to me. . I understand the contents of this consent, which adequately explains the benefits and risks of the Services being provided via telemedicine.  . I have been provided ample opportunity to ask questions regarding this consent and the Services and have had  my questions answered to my satisfaction. . I give my informed consent for the services to be provided through the use of telemedicine in my medical care  By participating in this telemedicine visit I agree to the above.

## 2019-05-31 DIAGNOSIS — Z79899 Other long term (current) drug therapy: Secondary | ICD-10-CM

## 2019-05-31 DIAGNOSIS — M328 Other forms of systemic lupus erythematosus: Secondary | ICD-10-CM

## 2019-05-31 NOTE — Addendum Note (Signed)
Addended by: Olin Hauser on: 05/31/2019 12:47 PM   Modules accepted: Orders

## 2019-06-01 ENCOUNTER — Other Ambulatory Visit
Admission: RE | Admit: 2019-06-01 | Discharge: 2019-06-01 | Disposition: A | Discharge status: Home / Self Care | Payer: BC Managed Care – PPO | Attending: Family Medicine | Admitting: Family Medicine

## 2019-06-01 DIAGNOSIS — M328 Other forms of systemic lupus erythematosus: Secondary | ICD-10-CM | POA: Insufficient documentation

## 2019-06-01 DIAGNOSIS — Z79899 Other long term (current) drug therapy: Secondary | ICD-10-CM | POA: Insufficient documentation

## 2019-06-01 LAB — CREATININE, SERUM
Creatinine, Ser: 0.73 mg/dL (ref 0.44–1.00)
GFR calc Af Amer: >60 mL/min (ref >60)
GFR calc non Af Amer: >60 mL/min (ref >60)

## 2019-06-01 LAB — CBC WITH DIFFERENTIAL/PLATELET
Abs Immature Granulocytes: 0.01 10*3/uL (ref 0.00–0.07)
Basophils Absolute: 0 10*3/uL (ref 0.0–0.1)
Basophils Relative: 0 %
Eosinophils Absolute: 0 10*3/uL (ref 0.0–0.5)
Eosinophils Relative: 1 %
HCT: 32.9 % — ABNORMAL LOW (ref 36.0–46.0)
Hemoglobin: 11 g/dL — ABNORMAL LOW (ref 12.0–15.0)
Immature Granulocytes: 0 %
Lymphocytes Relative: 15 %
Lymphs Abs: 0.5 10*3/uL — ABNORMAL LOW (ref 0.7–4.0)
MCH: 34.6 pg — ABNORMAL HIGH (ref 26.0–34.0)
MCHC: 33.4 g/dL (ref 30.0–36.0)
MCV: 103.5 fL — ABNORMAL HIGH (ref 80.0–100.0)
Monocytes Absolute: 0.4 10*3/uL (ref 0.1–1.0)
Monocytes Relative: 13 %
Neutro Abs: 2.2 10*3/uL (ref 1.7–7.7)
Neutrophils Relative %: 71 %
Platelets: 196 10*3/uL (ref 150–400)
RBC: 3.18 MIL/uL — ABNORMAL LOW (ref 3.87–5.11)
RDW: 14.8 % (ref 11.5–15.5)
WBC: 3.1 10*3/uL — ABNORMAL LOW (ref 4.0–10.5)
nRBC: 0 % (ref 0.0–0.2)

## 2019-06-01 LAB — AST: AST: 28 U/L (ref 15–41)

## 2019-06-01 LAB — ALT: ALT: 23 U/L (ref 0–44)

## 2019-06-14 ENCOUNTER — Ambulatory Visit
Admission: RE | Admit: 2019-06-14 | Discharge: 2019-06-14 | Disposition: A | Discharge status: Home / Self Care | Payer: BC Managed Care – PPO | Source: Ambulatory Visit | Attending: Family Medicine | Admitting: Family Medicine

## 2019-06-14 ENCOUNTER — Other Ambulatory Visit: Payer: Self-pay

## 2019-06-14 DIAGNOSIS — Z1231 Encounter for screening mammogram for malignant neoplasm of breast: Secondary | ICD-10-CM | POA: Diagnosis present

## 2019-06-17 ENCOUNTER — Other Ambulatory Visit: Payer: Self-pay | Admitting: Family Medicine

## 2019-06-17 DIAGNOSIS — K219 Gastro-esophageal reflux disease without esophagitis: Secondary | ICD-10-CM

## 2019-06-28 ENCOUNTER — Telehealth (INDEPENDENT_AMBULATORY_CARE_PROVIDER_SITE_OTHER): Payer: BC Managed Care – PPO | Admitting: Internal Medicine

## 2019-06-28 ENCOUNTER — Other Ambulatory Visit: Payer: Self-pay

## 2019-06-28 VITALS — BP 115/69 | HR 88 | Ht 64.0 in | Wt 145.0 lb

## 2019-06-28 DIAGNOSIS — R000 Tachycardia, unspecified: Secondary | ICD-10-CM | POA: Diagnosis not present

## 2019-06-28 NOTE — Patient Instructions (Addendum)
Medication Instructions:  - Your physician recommends that you continue on your current medications as directed. Please refer to the Current Medication list given to you today.  If you need a refill on your cardiac medications before your next appointment, please call your pharmacy.   Lab work: - none ordered  If you have labs (blood work) drawn today and your tests are completely normal, you will receive your results only by: Marland Kitchen MyChart Message (if you have MyChart) OR . A paper copy in the mail If you have any lab test that is abnormal or we need to change your treatment, we will call you to review the results.  Testing/Procedures: - none ordered  Follow-Up: At [facility], you and your health needs are our priority.  As part of our continuing mission to provide you with exceptional heart care, we have created designated Provider Care Teams.  These Care Teams include your primary Cardiologist (physician) and Advanced Practice Providers (APPs -  Physician Assistants and Nurse Practitioners) who all work together to provide you with the care you need, when you need it.  You will need a follow up appointment in 1 year with Dr. Caryl Comes (July 2021).   Please call our office 2 months in advance to schedule this appointment.  (Call in early May 2021 to schedule)  Any Other Special Instructions Will Be Listed Below (If Applicable). - Suggest liquid IV, NUUN or ThermaTabs

## 2019-06-28 NOTE — Progress Notes (Signed)
Electrophysiology TeleHealth Note   Due to national recommendations of social distancing due to COVID 19, an audio/video telehealth visit is felt to be most appropriate for this patient at this time.  See MyChart message from today for the patient's consent to telehealth for [facility].   Date:  06/28/2019   ID:  Christina Meza, DOB [birth year], MRN [id number]  Location: patient's home  Provider location: [address], [place]  Evaluation Performed: Follow-up visit  PCP:  Olin Hauser, DO  Cardiologist:     Electrophysiologist:  SK   Chief Complaint:  Sinus tachy  History of Present Illness:    Christina Meza is a 53 y.o. female who presents via audio/video conferencing for a telehealth visit today.  Since last being seen in our clinic for inappropriate sinus tachycardia dx in I[place] *  the patient reports doing better    She has a diagnosis of Lupus x about 15 yrs; among other complications she has raynauds.      Previously treated with verapamil.  Initially having been tried on labetalol; this caused significant shortness of breath and fatigue; she was diagnosed with secondary asthma.  With its abrupt discontinuation and initiation of verapamil she ended up in the hospital for 3 days tachycardia   2019 Started her on lowdose metoprolol tartrate which she has tolerated.  Resting rates have gone from the 110--80 range.  She continues to try to exercise on her treadmill.  Max is about 3 miles an hour with a heart rates of 130 down from 150.  Diet is salt deplete and somewhat volume depleted   DATE TEST EF   12/15 Echo  55%   1/18 Echo   50-55 % Ao root dilitation        DATE TEST    1/19 Holter Avg 101 ( nocturnal avg 90's)         Date Cr K TSH Hgb  1/19 0.82 4.4 1.626 13            The patient denies symptoms of fevers, chills, cough, or new SOB worrisome for COVID 19.   Past Medical History:  Diagnosis  Date  . Allergy   . Arthritis   . Asthma   . Clostridium difficile infection    2015x2 , 2016x1  . Depression   . Diastolic dysfunction    a. TTE 1/18: EF 50-55%, no RWMA, Gr1DD, mildly dilated aortic root of 3.4 cm, mildly dilated ascending aorta of 3.7 cm, mild mitral regurgitation, normal size left atrium, normal RV systolic function, PASP normal  . Genital warts   . GERD (gastroesophageal reflux disease)   . Inappropriate sinus tachycardia    a. 2010 Nuc Stress test: no ischemia/infarct;  b. 10/2013 Echo: Nl LV fxn, mild MR/TR.  . Migraine   . Palpitations   . Prolonged QT interval   . SLE (systemic lupus erythematosus) ([place])   . Stomach ulcer     Past Surgical History:  Procedure Laterality Date  . BREAST BIOPSY Right 03/01/2017   u/s core bx neg ADENOSIS AND FOCAL MICROCYST FORMATION.  Marland Kitchen BREAST CYST ASPIRATION Left 4-5 yrs ago   NEG  . CATARACT EXTRACTION  2008  . ENDOSCOPIC VEIN LASER TREATMENT    . UMBILICAL HERNIA REPAIR    . vulvar excision for HPV  07/04/2014    Current Outpatient Medications  Medication Sig Dispense Refill  . Belimumab 200 MG/ML SOAJ Inject 200 mg into the skin.    Marland Kitchen  Biotin 5000 MCG TABS Take by mouth.    . Cholecalciferol (VITAMIN D3) 3000 units TABS Take by mouth daily.    . diclofenac sodium (VOLTAREN) 1 % GEL Apply 1 application topically as needed.    . DULoxetine (CYMBALTA) 60 MG capsule TAKE 1 CAPSULE DAILY       (DISREGARD PREVIOUS        PRESCRIPTION FOR 30MG ) 90 capsule 3  . Fluticasone Propionate, Inhal, (FLOVENT DISKUS) 100 MCG/BLIST AEPB Inhale 1 puff into the lungs 2 (two) times daily. 60 each 2  . hydroxychloroquine (PLAQUENIL) 200 MG tablet Take 200 mg by mouth 2 (two) times daily.     Marland Kitchen ipratropium (ATROVENT) 0.06 % nasal spray PLACE 2 SPRAYS INTO BOTH NOSTRILS 4 (FOUR) TIMES DAILY. FOR UP TO 5-7 DAYS THEN STOP. 15 mL 2  . methotrexate (RHEUMATREX) 2.5 MG tablet Take 2.5 mg five tablets every Monday.    . metoprolol tartrate  (LOPRESSOR) 25 MG tablet TAKE 1 TABLET BY MOUTH TWICE A DAY 180 tablet 3  . metoprolol tartrate (LOPRESSOR) 25 MG tablet TAKE 1 TABLET TWICE A DAY 180 tablet 3  . Multiple Vitamins-Minerals (MULTIVITAMIN WITH MINERALS) tablet Take 1 tablet by mouth every morning.     . naproxen sodium (ALEVE) 220 MG tablet Take 1 tablet (220 mg total) by mouth at bedtime.    . ondansetron (ZOFRAN) 4 MG tablet Take 1 tablet (4 mg total) by mouth every 8 (eight) hours as needed for nausea or vomiting. 30 tablet 3  . pantoprazole (PROTONIX) 40 MG tablet TAKE 1 TABLET BY MOUTH EVERY DAY 90 tablet 3  . predniSONE (DELTASONE) 5 MG tablet Take 3 mg by mouth daily with breakfast.     . Probiotic Product (PROBIOTIC DAILY PO) Take 1 tablet by mouth daily.     . valACYclovir (VALTREX) 500 MG tablet Take 500 mg by mouth at bedtime.    . verapamil (CALAN) 40 MG tablet Take one tablet every 8 hours as needed for Raynaud's 90 tablet 3  . verapamil (CALAN) 40 MG tablet TAKE 1 TABLET BY MOUTH EVERY 8 HOURS AS NEEDED FOR RAYNAUD'S 270 tablet 1  . vitamin E 400 UNIT capsule Take 400 Units by mouth daily.    Marland Kitchen zolpidem (AMBIEN CR) 6.25 MG CR tablet Take 1 tablet (6.25 mg total) by mouth at bedtime as needed for sleep. 90 tablet 1  . levalbuterol (XOPENEX HFA) 45 MCG/ACT inhaler Inhale 1 puff into the lungs every 4 (four) hours as needed for wheezing. Reported on 03/14/2016    . magnesium oxide (MAG-OX) 400 MG tablet Take 1 tablet (400 mg total) by mouth daily. (Patient not taking: Reported on 06/28/2019) 90 tablet 3  . triamcinolone cream (KENALOG) 0.5 % Apply 1 application topically 2 (two) times daily. To affected areas, for up to 2 weeks. (Patient not taking: Reported on 06/28/2019) 30 g 1   No current facility-administered medications for this visit.     Allergies:   Lansoprazole, Celebrex [celecoxib], Sulfa antibiotics, Sulfamethoxazole-trimethoprim, and Furosemide   Social History:  The patient  reports that she has never  smoked. She has never used smokeless tobacco. She reports current alcohol use. She reports that she does not use drugs.   Family History:  The patient's   family history includes Arthritis in her mother; Bipolar disorder in her mother; Colon cancer in her paternal grandfather; Depression in her mother; Heart disease in her maternal grandfather; Hyperlipidemia in her maternal grandfather; Hypertension in her maternal grandfather  and maternal grandmother; Mental illness in her mother; Stroke in her maternal grandfather.   ROS:  Please see the history of present illness.   All other systems are personally reviewed and negative.    Exam:    Vital Signs:  BP 115/69 (BP Location: Left Arm, Patient Position: Sitting, Cuff Size: Normal)   Pulse 88   Ht 5\' 4"  (1.626 m)   Wt 145 lb (65.8 kg)   LMP 07/15/2015 (Within Days)   BMI 24.89 kg/m     Well appearing, alert and conversant, regular work of breathing,  good skin color Eyes- anicteric, neuro- grossly intact, skin- no apparent rash or lesions or cyanosis, mouth- oral mucosa is pink   Labs/Other Tests and Data Reviewed:    Recent Labs: 06/01/2019: ALT 23; Creatinine, Ser 0.73; Hemoglobin 11.0; Platelets 196   Wt Readings from Last 3 Encounters:  06/28/19 145 lb (65.8 kg)  10/19/18 149 lb 3.2 oz (67.7 kg)  01/19/18 146 lb 8 oz (66.5 kg)     Other studies personally reviewed: Additional studies/ records that were reviewed today include:    Review of the above records today demonstrates:   Prior radiographs:      ASSESSMENT & PLAN:    Inappropriate sinus tachycardia  Overall has improved on the low-dose metoprolol.  We discussed trying to take it a little bit earlier in the morning prior to her morning exercise; also about rehydration.  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  Suggested liquid IV, NUUN or ThermaTabs  Reviewed the physiology  We spent more than 50% of  our >25 min visit in face to face counseling regarding the above  COVID 19 screen The patient denies symptoms of COVID 19 at this time.  The importance of social distancing was discussed today.  Follow-up:  4353m    Current medicines are reviewed at length with the patient today.   The patient does not have concerns regarding her medicines.  The following changes were made today:  none  Labs/ tests ordered today include:   No orders of the defined types were placed in this encounter.   Future tests ( post COVID )    in    months  Patient Risk:  after full review of this patients clinical status, I feel that they are at moderate risk at this time.  Today, I have spent 13 minutes with the patient with telehealth technology discussing the above.  We spent more than 50% of our >25 min visit in face to face counseling regarding the above  Signed, Sherryl MangesSteven Klein, MD  06/28/2019 8:40 AM     [facility] [address] [place] [address] [phone] (office) [phone] (fax)

## 2019-07-18 ENCOUNTER — Other Ambulatory Visit: Payer: Self-pay | Admitting: Family Medicine

## 2019-07-18 DIAGNOSIS — J0100 Acute maxillary sinusitis, unspecified: Secondary | ICD-10-CM

## 2019-08-18 ENCOUNTER — Other Ambulatory Visit: Payer: Self-pay | Admitting: Family Medicine

## 2019-08-18 DIAGNOSIS — J4540 Moderate persistent asthma, uncomplicated: Secondary | ICD-10-CM

## 2019-08-26 ENCOUNTER — Other Ambulatory Visit: Payer: Self-pay | Admitting: Family Medicine

## 2019-08-26 DIAGNOSIS — J0100 Acute maxillary sinusitis, unspecified: Secondary | ICD-10-CM

## 2019-09-03 LAB — HM DIABETES EYE EXAM

## 2019-09-04 ENCOUNTER — Encounter: Payer: Self-pay | Admitting: Family Medicine

## 2019-09-05 ENCOUNTER — Other Ambulatory Visit
Admission: RE | Admit: 2019-09-05 | Discharge: 2019-09-05 | Disposition: A | Discharge status: Home / Self Care | Payer: BC Managed Care – PPO | Source: Ambulatory Visit | Attending: Family Medicine | Admitting: Family Medicine

## 2019-09-05 DIAGNOSIS — M329 Systemic lupus erythematosus, unspecified: Secondary | ICD-10-CM | POA: Insufficient documentation

## 2019-09-05 DIAGNOSIS — Z79899 Other long term (current) drug therapy: Secondary | ICD-10-CM | POA: Diagnosis not present

## 2019-09-05 LAB — CBC WITH DIFFERENTIAL/PLATELET
Abs Immature Granulocytes: 0.01 10*3/uL (ref 0.00–0.07)
Basophils Absolute: 0 10*3/uL (ref 0.0–0.1)
Basophils Relative: 0 %
Eosinophils Absolute: 0 10*3/uL (ref 0.0–0.5)
Eosinophils Relative: 1 %
HCT: 33.2 % — ABNORMAL LOW (ref 36.0–46.0)
Hemoglobin: 10.8 g/dL — ABNORMAL LOW (ref 12.0–15.0)
Immature Granulocytes: 0 %
Lymphocytes Relative: 17 %
Lymphs Abs: 0.4 10*3/uL — ABNORMAL LOW (ref 0.7–4.0)
MCH: 33 pg (ref 26.0–34.0)
MCHC: 32.5 g/dL (ref 30.0–36.0)
MCV: 101.5 fL — ABNORMAL HIGH (ref 80.0–100.0)
Monocytes Absolute: 0.4 10*3/uL (ref 0.1–1.0)
Monocytes Relative: 15 %
Neutro Abs: 1.7 10*3/uL (ref 1.7–7.7)
Neutrophils Relative %: 67 %
Platelets: 213 10*3/uL (ref 150–400)
RBC: 3.27 MIL/uL — ABNORMAL LOW (ref 3.87–5.11)
RDW: 14.3 % (ref 11.5–15.5)
WBC: 2.5 10*3/uL — ABNORMAL LOW (ref 4.0–10.5)
nRBC: 0 % (ref 0.0–0.2)

## 2019-10-09 ENCOUNTER — Other Ambulatory Visit: Payer: Self-pay | Admitting: Family Medicine

## 2019-10-09 DIAGNOSIS — J0100 Acute maxillary sinusitis, unspecified: Secondary | ICD-10-CM

## 2019-10-15 ENCOUNTER — Other Ambulatory Visit: Payer: Self-pay | Admitting: Family Medicine

## 2019-10-15 DIAGNOSIS — J0100 Acute maxillary sinusitis, unspecified: Secondary | ICD-10-CM

## 2019-10-19 ENCOUNTER — Other Ambulatory Visit
Admission: RE | Admit: 2019-10-19 | Discharge: 2019-10-19 | Disposition: A | Discharge status: Home / Self Care | Payer: BC Managed Care – PPO | Attending: Registered Nurse | Admitting: Registered Nurse

## 2019-10-19 DIAGNOSIS — Z79899 Other long term (current) drug therapy: Secondary | ICD-10-CM | POA: Diagnosis not present

## 2019-10-19 DIAGNOSIS — M329 Systemic lupus erythematosus, unspecified: Secondary | ICD-10-CM | POA: Insufficient documentation

## 2019-10-19 LAB — CBC WITH DIFFERENTIAL/PLATELET
Abs Immature Granulocytes: 0.01 10*3/uL (ref 0.00–0.07)
Basophils Absolute: 0 10*3/uL (ref 0.0–0.1)
Basophils Relative: 0 %
Eosinophils Absolute: 0 10*3/uL (ref 0.0–0.5)
Eosinophils Relative: 1 %
HCT: 34.9 % — ABNORMAL LOW (ref 36.0–46.0)
Hemoglobin: 11.6 g/dL — ABNORMAL LOW (ref 12.0–15.0)
Immature Granulocytes: 0 %
Lymphocytes Relative: 22 %
Lymphs Abs: 0.6 10*3/uL — ABNORMAL LOW (ref 0.7–4.0)
MCH: 32.9 pg (ref 26.0–34.0)
MCHC: 33.2 g/dL (ref 30.0–36.0)
MCV: 98.9 fL (ref 80.0–100.0)
Monocytes Absolute: 0.3 10*3/uL (ref 0.1–1.0)
Monocytes Relative: 12 %
Neutro Abs: 1.7 10*3/uL (ref 1.7–7.7)
Neutrophils Relative %: 65 %
Platelets: 227 10*3/uL (ref 150–400)
RBC: 3.53 MIL/uL — ABNORMAL LOW (ref 3.87–5.11)
RDW: 14.2 % (ref 11.5–15.5)
Smear Review: NORMAL
WBC: 2.7 10*3/uL — ABNORMAL LOW (ref 4.0–10.5)
nRBC: 0 % (ref 0.0–0.2)

## 2019-10-25 LAB — HM PAP SMEAR

## 2019-10-27 ENCOUNTER — Other Ambulatory Visit: Payer: Self-pay | Admitting: Family Medicine

## 2019-10-27 ENCOUNTER — Other Ambulatory Visit: Payer: Self-pay | Admitting: Internal Medicine

## 2019-10-27 DIAGNOSIS — F32A Depression, unspecified: Secondary | ICD-10-CM

## 2019-10-27 DIAGNOSIS — F329 Major depressive disorder, single episode, unspecified: Secondary | ICD-10-CM

## 2019-11-13 ENCOUNTER — Other Ambulatory Visit: Payer: Self-pay

## 2019-11-13 ENCOUNTER — Other Ambulatory Visit
Admission: RE | Admit: 2019-11-13 | Discharge: 2019-11-13 | Disposition: A | Discharge status: Home / Self Care | Payer: BC Managed Care – PPO | Attending: Family Medicine | Admitting: Family Medicine

## 2019-11-13 DIAGNOSIS — Z79899 Other long term (current) drug therapy: Secondary | ICD-10-CM | POA: Diagnosis not present

## 2019-11-13 DIAGNOSIS — M328 Other forms of systemic lupus erythematosus: Secondary | ICD-10-CM | POA: Insufficient documentation

## 2019-11-13 LAB — CBC WITH DIFFERENTIAL/PLATELET
Abs Immature Granulocytes: 0.02 10*3/uL (ref 0.00–0.07)
Basophils Absolute: 0 10*3/uL (ref 0.0–0.1)
Basophils Relative: 0 %
Eosinophils Absolute: 0 10*3/uL (ref 0.0–0.5)
Eosinophils Relative: 1 %
HCT: 34.9 % — ABNORMAL LOW (ref 36.0–46.0)
Hemoglobin: 11.5 g/dL — ABNORMAL LOW (ref 12.0–15.0)
Immature Granulocytes: 1 %
Lymphocytes Relative: 12 %
Lymphs Abs: 0.5 10*3/uL — ABNORMAL LOW (ref 0.7–4.0)
MCH: 33.5 pg (ref 26.0–34.0)
MCHC: 33 g/dL (ref 30.0–36.0)
MCV: 101.7 fL — ABNORMAL HIGH (ref 80.0–100.0)
Monocytes Absolute: 0.5 10*3/uL (ref 0.1–1.0)
Monocytes Relative: 13 %
Neutro Abs: 2.7 10*3/uL (ref 1.7–7.7)
Neutrophils Relative %: 73 %
Platelets: 186 10*3/uL (ref 150–400)
RBC: 3.43 MIL/uL — ABNORMAL LOW (ref 3.87–5.11)
RDW: 14.1 % (ref 11.5–15.5)
WBC: 3.7 10*3/uL — ABNORMAL LOW (ref 4.0–10.5)
nRBC: 0 % (ref 0.0–0.2)

## 2020-01-21 ENCOUNTER — Other Ambulatory Visit: Payer: Self-pay

## 2020-01-21 ENCOUNTER — Other Ambulatory Visit
Admission: RE | Admit: 2020-01-21 | Discharge: 2020-01-21 | Disposition: A | Discharge status: Home / Self Care | Payer: BC Managed Care – PPO | Attending: Internal Medicine | Admitting: Internal Medicine

## 2020-01-21 DIAGNOSIS — Z79899 Other long term (current) drug therapy: Secondary | ICD-10-CM | POA: Diagnosis not present

## 2020-01-21 DIAGNOSIS — M329 Systemic lupus erythematosus, unspecified: Secondary | ICD-10-CM | POA: Diagnosis present

## 2020-01-21 LAB — CBC WITH DIFFERENTIAL/PLATELET
Abs Immature Granulocytes: 0.01 10*3/uL (ref 0.00–0.07)
Basophils Absolute: 0 10*3/uL (ref 0.0–0.1)
Basophils Relative: 0 %
Eosinophils Absolute: 0 10*3/uL (ref 0.0–0.5)
Eosinophils Relative: 1 %
HCT: 33.1 % — ABNORMAL LOW (ref 36.0–46.0)
Hemoglobin: 11 g/dL — ABNORMAL LOW (ref 12.0–15.0)
Immature Granulocytes: 0 %
Lymphocytes Relative: 17 %
Lymphs Abs: 0.5 10*3/uL — ABNORMAL LOW (ref 0.7–4.0)
MCH: 33.5 pg (ref 26.0–34.0)
MCHC: 33.2 g/dL (ref 30.0–36.0)
MCV: 100.9 fL — ABNORMAL HIGH (ref 80.0–100.0)
Monocytes Absolute: 0.5 10*3/uL (ref 0.1–1.0)
Monocytes Relative: 19 %
Neutro Abs: 1.6 10*3/uL — ABNORMAL LOW (ref 1.7–7.7)
Neutrophils Relative %: 63 %
Platelets: 189 10*3/uL (ref 150–400)
RBC: 3.28 MIL/uL — ABNORMAL LOW (ref 3.87–5.11)
RDW: 14.1 % (ref 11.5–15.5)
WBC: 2.6 10*3/uL — ABNORMAL LOW (ref 4.0–10.5)
nRBC: 0 % (ref 0.0–0.2)

## 2020-01-21 LAB — BASIC METABOLIC PANEL
Anion gap: 6 (ref 5–15)
BUN: 22 mg/dL — ABNORMAL HIGH (ref 6–20)
CO2: 28 mmol/L (ref 22–32)
Calcium: 8.7 mg/dL — ABNORMAL LOW (ref 8.9–10.3)
Chloride: 103 mmol/L (ref 98–111)
Creatinine, Ser: 0.81 mg/dL (ref 0.44–1.00)
GFR calc Af Amer: >60 mL/min (ref >60)
GFR calc non Af Amer: >60 mL/min (ref >60)
Glucose, Bld: 83 mg/dL (ref 70–99)
Potassium: 4.8 mmol/L (ref 3.5–5.1)
Sodium: 137 mmol/L (ref 135–145)

## 2020-01-21 LAB — SEDIMENTATION RATE: Sed Rate: 41 mm/hr — ABNORMAL HIGH (ref 0–30)

## 2020-01-21 LAB — ALT: ALT: 21 U/L (ref 0–44)

## 2020-01-21 LAB — AST: AST: 25 U/L (ref 15–41)

## 2020-01-21 LAB — C-REACTIVE PROTEIN: CRP: 1.5 mg/dL — ABNORMAL HIGH (ref <1.0)

## 2020-01-21 LAB — URINALYSIS, ROUTINE W REFLEX MICROSCOPIC
Bilirubin Urine: NEGATIVE
Glucose, UA: NEGATIVE mg/dL
Hgb urine dipstick: NEGATIVE
Ketones, ur: NEGATIVE mg/dL
Leukocytes,Ua: NEGATIVE
Nitrite: NEGATIVE
Protein, ur: NEGATIVE mg/dL
Specific Gravity, Urine: 1.006 (ref 1.005–1.030)
pH: 6 (ref 5.0–8.0)

## 2020-01-21 LAB — PROTEIN / CREATININE RATIO, URINE
Creatinine, Urine: 44 mg/dL
Total Protein, Urine: <6 mg/dL

## 2020-01-22 LAB — C4 COMPLEMENT: Complement C4, Body Fluid: 8 mg/dL — ABNORMAL LOW (ref 12–38)

## 2020-01-22 LAB — ANTI-DNA ANTIBODY, DOUBLE-STRANDED: ds DNA Ab: 1 IU/mL (ref 0–9)

## 2020-01-22 LAB — C3 COMPLEMENT: C3 Complement: 86 mg/dL (ref 82–167)

## 2020-03-24 ENCOUNTER — Encounter: Payer: Self-pay | Admitting: Family Medicine

## 2020-03-24 ENCOUNTER — Other Ambulatory Visit: Payer: Self-pay

## 2020-03-24 ENCOUNTER — Ambulatory Visit (INDEPENDENT_AMBULATORY_CARE_PROVIDER_SITE_OTHER): Payer: BC Managed Care – PPO | Admitting: Family Medicine

## 2020-03-24 VITALS — BP 91/50 | HR 90 | Temp 97.8°F | Resp 16 | Ht 64.0 in | Wt 145.6 lb

## 2020-03-24 DIAGNOSIS — F331 Major depressive disorder, recurrent, moderate: Secondary | ICD-10-CM | POA: Diagnosis not present

## 2020-03-24 DIAGNOSIS — F411 Generalized anxiety disorder: Secondary | ICD-10-CM | POA: Diagnosis not present

## 2020-03-24 DIAGNOSIS — M329 Systemic lupus erythematosus, unspecified: Secondary | ICD-10-CM

## 2020-03-24 MED ORDER — BUSPIRONE HCL 5 MG PO TABS: 5.0000 mg | ORAL_TABLET | Freq: Two times a day (BID) | ORAL | 2 refills | Status: DC | PRN

## 2020-03-24 NOTE — Patient Instructions (Addendum)
Thank you for coming to the office today.  Continue Duloxetine (Cymbalta) 60mg  daily.  Add Buspar 5mg  - can take 1-2 times a day as needed, lasts only for about 4-6 hours approximately, better to take before significant anxiety develops. Can take regularly or as needed only. Take with some food or snack to help avoid upset stomach. Future can adjust or increase dose and change regimen.  Next option if prefer other med, would be switch to Wellbutrin for mood instead if you prefer.  1st 3 should require referral if you want   , LCSW [address] [place], [place] Main Line: [phone]  [facility]) Address: [address], [employer], [address] [place] Hours: 8:30AM-5PM Phone: [phone]  [address], MD [address] [facility] [address] [place], [facility] [place] Phone: [phone]  [facility] (All ages) [address], [facility] [place]vin Knack, [place] Phone: [phone] (Option 1) [email or web address]  [facility]) [place] [address] [place], [address] [place] Phone: [phone]   ----------------------------------------------------------------- PSYCHOLOGY COUNSELING ONLY  [facility]   Address: [address] [place], [address] [place] Hours: Open today  9AM-7PM Phone: [phone]   [facility] [address] [facility]  - [facility] Address: [address] [facility] [place], [place] Phone: [phone]    Please schedule a Follow-up Appointment to: Return in about 3 months (around 06/23/2020) for 3 month follow-up Depression / Anxiety PHQ GAD.  If you have any other questions or concerns, please feel free to call the office or send a message through MyChart. You may also schedule an earlier appointment if necessary.  Additionally, you may be receiving a survey about your experience at  our office within a few days to 1 week by e-mail or mail. We value your feedback.  [phone], DO [facility], Saralyn Pilar

## 2020-03-24 NOTE — Progress Notes (Signed)
Subjective:    Patient ID: Christina Meza, female    DOB: 1966/02/28, 54 y.o.   MRN: [id number]  Christina Meza is a 54 y.o. female presenting on 03/24/2020 for Depression   HPI   Anxiety / Chronic Depression recurrent, moderate Last visit for depression / mood in 2018 focused on this problem. See prior notes for background information. - Interval update with worsening in past 1 month, now her husband had surgery and he had been home more with her mother and that has fueled things worse in past month. - Today patient reports persistent worsening with home life and situational, she will get anxious about going home in general, it often will trigger her symptoms, then after flare up she will feel depressed, feels "not able to live my own life".- Her mother lives with her now and this is causing her increasing stress. Her mother has history of memory loss and dementia. - She has felt on edge often, and has had quick temper at times. - She continues to take Duloxetine (Cymbalta) 60mg  daily - Chart review shows she did try Buspar 5mg  back in 2018, for period of time but had some stomach upset and DC'd med, she does not recall and wants to try this again for anxiety - She tried [facility] for someone to talk to for counseling (phone/zoom) and they have limited availability only every 6 months. She thinks this is helpful but limited access. She is looking for other therapist. - Admits feels physical fatigue and drained lately. She says has been weaned down on Prednisone and off. She has noticed increased fatigue since off steroid. She was told to be off Steroids for fatigue/joint pain and to do a trial off per Rheumatology. - She is sleeping fairly well now without major insomnia problem. Not taking Ambien. May wake up at night but usually can fall back asleep fairly quickly.  SLE / Lupus Followed by [facility] Rheumatology - Dr 2019 Recently taken off Prednisone, tapered off, thought to do  trial off now. See above, has affected her mood it seems.  Depression screen [facility] 2/9 03/24/2020 05/06/2017 09/22/2016  Decreased Interest 3 0 0  Down, Depressed, Hopeless 2 0 1  PHQ - 2 Score 5 0 1  Altered sleeping 3 2 3   Tired, decreased energy 3 3 3   Change in appetite 2 1 1   Feeling bad or failure about yourself  2 0 0  Trouble concentrating 1 0 1  Moving slowly or fidgety/restless 0 1 0  Suicidal thoughts 0 0 0  PHQ-9 Score 16 7 9   Difficult doing work/chores Somewhat difficult - Not difficult at all   GAD 7 : Generalized Anxiety Score 03/24/2020 05/06/2017  Nervous, Anxious, on Edge 3 3  Control/stop worrying 3 1  Worry too much - different things 3 3  Trouble relaxing 3 3  Restless 1 1  Easily annoyed or irritable 3 3  Afraid - awful might happen 1 0  Total GAD 7 Score 17 14  Anxiety Difficulty Somewhat difficult Somewhat difficult     Social History   Tobacco Use  . Smoking status: Never Smoker  . Smokeless tobacco: Never Used  Substance Use Topics  . Alcohol use: Yes    Alcohol/week: 0.0 - 1.0 standard drinks  . Drug use: No    Review of Systems Per HPI unless specifically indicated above     Objective:    BP (!) 91/50   Pulse 90   Temp 97.8 F (  36.6 C) (Temporal)   Resp 16   Ht 5\' 4"  (1.626 m)   Wt 145 lb 9.6 oz (66 kg)   LMP 07/15/2015 (Within Days)   BMI 24.99 kg/m   Wt Readings from Last 3 Encounters:  03/24/20 145 lb 9.6 oz (66 kg)  06/28/19 145 lb (65.8 kg)  10/19/18 149 lb 3.2 oz (67.7 kg)    Physical Exam Vitals and nursing note reviewed.  Constitutional:      General: She is not in acute distress.    Appearance: She is well-developed. She is not diaphoretic.     Comments: Well-appearing, comfortable, cooperative  HENT:     Head: Normocephalic and atraumatic.  Eyes:     General:        Right eye: No discharge.        Left eye: No discharge.     Conjunctiva/sclera: Conjunctivae normal.  Cardiovascular:     Rate and Rhythm: Normal  rate.  Pulmonary:     Effort: Pulmonary effort is normal.  Skin:    General: Skin is warm and dry.     Findings: No erythema or rash.  Neurological:     Mental Status: She is alert and oriented to person, place, and time.  Psychiatric:        Behavior: Behavior normal.     Comments: Well groomed, good eye contact, normal speech and thoughts. Mood is mildly down today but has good insight into her health. No crying spells or labile mood    Results for orders placed or performed during the hospital encounter of 01/21/20  Urinalysis, Routine w reflex microscopic  Result Value Ref Range   Color, Urine YELLOW (A) YELLOW   APPearance HAZY (A) CLEAR   Specific Gravity, Urine 1.006 1.005 - 1.030   pH 6.0 5.0 - 8.0   Glucose, UA NEGATIVE NEGATIVE mg/dL   Hgb urine dipstick NEGATIVE NEGATIVE   Bilirubin Urine NEGATIVE NEGATIVE   Ketones, ur NEGATIVE NEGATIVE mg/dL   Protein, ur NEGATIVE NEGATIVE mg/dL   Nitrite NEGATIVE NEGATIVE   Leukocytes,Ua NEGATIVE NEGATIVE  Protein / creatinine ratio, urine  Result Value Ref Range   Creatinine, Urine 44 mg/dL   Total Protein, Urine <6 mg/dL   Protein Creatinine Ratio        0.00 - 0.15 mg/mg[Cre]  CBC with Differential/Platelet  Result Value Ref Range   WBC 2.6 (L) 4.0 - 10.5 K/uL   RBC 3.28 (L) 3.87 - 5.11 MIL/uL   Hemoglobin 11.0 (L) 12.0 - 15.0 g/dL   HCT 01/23/20 (L) 67.2 - 09.4 %   MCV 100.9 (H) 80.0 - 100.0 fL   MCH 33.5 26.0 - 34.0 pg   MCHC 33.2 30.0 - 36.0 g/dL   RDW 70.9 62.8 - 36.6 %   Platelets 189 150 - 400 K/uL   nRBC 0.0 0.0 - 0.2 %   Neutrophils Relative % 63 %   Neutro Abs 1.6 (L) 1.7 - 7.7 K/uL   Lymphocytes Relative 17 %   Lymphs Abs 0.5 (L) 0.7 - 4.0 K/uL   Monocytes Relative 19 %   Monocytes Absolute 0.5 0.1 - 1.0 K/uL   Eosinophils Relative 1 %   Eosinophils Absolute 0.0 0.0 - 0.5 K/uL   Basophils Relative 0 %   Basophils Absolute 0.0 0.0 - 0.1 K/uL   Immature Granulocytes 0 %   Abs Immature Granulocytes 0.01 0.00  - 0.07 K/uL  C-reactive protein  Result Value Ref Range   CRP 1.5 (H) <1.0  mg/dL  Sedimentation rate  Result Value Ref Range   Sed Rate 41 (H) 0 - 30 mm/hr  C4 complement  Result Value Ref Range   Complement C4, Body Fluid 8 (L) 12 - 38 mg/dL  C3 complement  Result Value Ref Range   C3 Complement 86 82 - 167 mg/dL  Basic metabolic panel  Result Value Ref Range   Sodium 137 135 - 145 mmol/L   Potassium 4.8 3.5 - 5.1 mmol/L   Chloride 103 98 - 111 mmol/L   CO2 28 22 - 32 mmol/L   Glucose, Bld 83 70 - 99 mg/dL   BUN 22 (H) 6 - 20 mg/dL   Creatinine, Ser 9.67 0.44 - 1.00 mg/dL   Calcium 8.7 (L) 8.9 - 10.3 mg/dL   GFR calc non Af Amer >60 >60 mL/min   GFR calc Af Amer >60 >60 mL/min   Anion gap 6 5 - 15  AST  Result Value Ref Range   AST 25 15 - 41 U/L  Anti-DNA antibody, double-stranded  Result Value Ref Range   ds DNA Ab 1 0 - 9 IU/mL  ALT  Result Value Ref Range   ALT 21 0 - 44 U/L      Assessment & Plan:   Problem List Items Addressed This Visit    SLE (systemic lupus erythematosus) (HCC)   GAD (generalized anxiety disorder)   Relevant Medications   busPIRone (BUSPAR) 5 MG tablet   Other Relevant Orders   Ambulatory referral to Social Work   Depression, major, recurrent, moderate (HCC) - Primary   Relevant Medications   busPIRone (BUSPAR) 5 MG tablet   Other Relevant Orders   Ambulatory referral to Social Work      #Depression, major chronic recurrent / Anxiety Chronic problems Inadequately controlled now worse recent flare with life stressors with mother living at home with her with declining health/memory and relationship strains with family dynamic. On duloxetine 60mg  daily for years, has done well In past trial on Buspar temporarily has GI intolerance 2018 then off  Plan Discussed options today. Given her current triggers for mood/anxiety seems best option would be both medicine and also therapist. Handout given and ultimately have sent referral already  to 2019 LCSW therapist at [facility] for [facility]BT - Will continue Duloxetine 60mg  daily, given her SLE seems to help with chronic joint pain - Also off Prednisone per [facility] Rheumatology may have impacted her mood. - For anxiety will restart trial of Buspar can do 5mg  intermittent PRN only or daily, start low, take with food, determine if need dose adjust or other option - In future next suggestion for meds could be add Wellbutrin to help with mood  Follow-up  Orders Placed This Encounter  Procedures  . Ambulatory referral to Social Work    Referral Priority:   Routine    Referral Type:   Consultation    Referral Reason:   Specialty Services Required    Referred to Provider:   , LCSW    Number of Visits Requested:   1     Meds ordered this encounter  Medications  . busPIRone (BUSPAR) 5 MG tablet    Sig: Take 1 tablet (5 mg total) by mouth 2 (two) times daily as needed (anxiety).    Dispense:  60 tablet    Refill:  2      Follow up plan: Return in about 3 months (around 06/23/2020) for 3 month follow-up Depression / Anxiety PHQ  GAD.   Nobie Putnam, DO [facility] 03/24/2020, 11:02 AM

## 2020-04-10 ENCOUNTER — Telehealth: Payer: Self-pay | Admitting: Family Medicine

## 2020-04-10 NOTE — Telephone Encounter (Signed)
Attempted to contact the patient back, no answer. LMOM to return my call.

## 2020-04-10 NOTE — Telephone Encounter (Signed)
Patient returning call from office. Pt states her short term disability is for Lupus . Please reach out to pt    [phone] call back number

## 2020-04-10 NOTE — Telephone Encounter (Signed)
Called patient to find out what the Short Term Disability paper work is for, She will still need a visit in person or virtual for the paperwork. Unable to reach the patient left message to call back.

## 2020-04-11 NOTE — Telephone Encounter (Signed)
Spoke to the patient she does not need any paper work from Dr.K , her Rheum will fill that out it was for [facility] and not STD.

## 2020-04-15 ENCOUNTER — Other Ambulatory Visit: Payer: Self-pay | Admitting: Family Medicine

## 2020-04-15 DIAGNOSIS — F411 Generalized anxiety disorder: Secondary | ICD-10-CM

## 2020-04-15 NOTE — Telephone Encounter (Signed)
Pharmacy requesting changes to original Rx- patient is supposed to come back in 3 months from last office visit. Sent for review and diagnosis code.

## 2020-04-28 ENCOUNTER — Encounter: Payer: Self-pay | Admitting: Family Medicine

## 2020-04-29 ENCOUNTER — Other Ambulatory Visit: Payer: Self-pay | Admitting: Gastroenterology

## 2020-04-29 ENCOUNTER — Ambulatory Visit: Payer: BC Managed Care – PPO

## 2020-04-29 DIAGNOSIS — R1011 Right upper quadrant pain: Secondary | ICD-10-CM

## 2020-04-29 DIAGNOSIS — R112 Nausea with vomiting, unspecified: Secondary | ICD-10-CM

## 2020-04-29 DIAGNOSIS — R140 Abdominal distension (gaseous): Secondary | ICD-10-CM

## 2020-04-30 ENCOUNTER — Other Ambulatory Visit: Payer: Self-pay

## 2020-04-30 ENCOUNTER — Ambulatory Visit
Admission: RE | Admit: 2020-04-30 | Discharge: 2020-04-30 | Disposition: A | Discharge status: Home / Self Care | Payer: BC Managed Care – PPO | Source: Ambulatory Visit | Attending: Gastroenterology | Admitting: Gastroenterology

## 2020-04-30 DIAGNOSIS — R1011 Right upper quadrant pain: Secondary | ICD-10-CM | POA: Diagnosis not present

## 2020-04-30 DIAGNOSIS — R140 Abdominal distension (gaseous): Secondary | ICD-10-CM | POA: Diagnosis present

## 2020-04-30 DIAGNOSIS — R112 Nausea with vomiting, unspecified: Secondary | ICD-10-CM | POA: Diagnosis present

## 2020-05-15 ENCOUNTER — Other Ambulatory Visit: Payer: Self-pay | Admitting: Family Medicine

## 2020-05-15 DIAGNOSIS — Z1231 Encounter for screening mammogram for malignant neoplasm of breast: Secondary | ICD-10-CM

## 2020-05-22 ENCOUNTER — Ambulatory Visit (INDEPENDENT_AMBULATORY_CARE_PROVIDER_SITE_OTHER): Payer: BC Managed Care – PPO | Admitting: Psychology

## 2020-05-22 DIAGNOSIS — F4323 Adjustment disorder with mixed anxiety and depressed mood: Secondary | ICD-10-CM | POA: Diagnosis not present

## 2020-05-26 ENCOUNTER — Other Ambulatory Visit: Payer: Self-pay | Admitting: *Deleted

## 2020-05-26 MED ORDER — METOPROLOL TARTRATE 25 MG PO TABS: 25.0000 mg | ORAL_TABLET | Freq: Two times a day (BID) | ORAL | 1 refills | Status: DC

## 2020-05-27 ENCOUNTER — Other Ambulatory Visit: Payer: Self-pay

## 2020-05-27 ENCOUNTER — Telehealth (INDEPENDENT_AMBULATORY_CARE_PROVIDER_SITE_OTHER): Payer: BC Managed Care – PPO | Admitting: Family Medicine

## 2020-05-27 ENCOUNTER — Encounter: Payer: Self-pay | Admitting: Family Medicine

## 2020-05-27 DIAGNOSIS — A084 Viral intestinal infection, unspecified: Secondary | ICD-10-CM

## 2020-05-27 NOTE — Progress Notes (Signed)
Subjective:    Patient ID: Christina Meza, female    DOB: Apr 16, 1966, 54 y.o.   MRN: [id number]  Christina Meza is a 54 y.o. female presenting on 05/27/2020 for Emesis (fever 100.9 F, abdominal pain, weak, diarrhea, chills onset 4 days )  Virtual / Telehealth Encounter - Video Visit via MyChart The purpose of this virtual visit is to provide medical care while limiting exposure to the novel coronavirus (COVID19) for both patient and office staff.  Consent was obtained for remote visit:  Yes.   Answered questions that patient had about telehealth interaction:  Yes.   I discussed the limitations, risks, security and privacy concerns of performing an evaluation and management service by video/telephone. I also discussed with the patient that there may be a patient responsible charge related to this service. The patient expressed understanding and agreed to proceed.  Patient Location: Home Provider Location: [facility])   Patient presents for a same day appointment.  HPI   ABDOMINAL PAIN R sided / Epigastric / Nausea Vomiting / Diarrhea / Fever Reports onset symptoms this weekend with general malaise was less active, then next day Sunday she was on call, went into hospital and she developed some R sided abdominal pain with epigastric burning and pain and she thought maybe needed to eat something and she tried peanut butter & jelly, and developed prompt symptoms of overwhelming nausea vomiting up to 6 x, then was dry heaving with no production of vomit. She had some chills and highest temp of 101.51F then took Tylenol, abdominal pain on R side. 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 She had some radiating pain upwards into abdomen, and she had episode of diarrhea yesterday morning, then improved diet with clear liquids and popsicles, she has had improved symptoms  today with reduced pain from 4-5 out of 10 down to 2-3 out of 10, now has no reduced appetite and no nausea vomiting or diarrhea, seems most symptoms improved still has residual pain. No prior abdominal surgery for appendix or gallbladder. History of umbilical hernia repair only in past. Denies any further fevers, chills, nausea vomiting, diarrhea  HM: UTD COVID19 vaccine Pfizer updated.  Past Surgical History:  Procedure Laterality Date  . BREAST BIOPSY Right 03/01/2017   u/s core bx neg ADENOSIS AND FOCAL MICROCYST FORMATION.  Marland Kitchen BREAST CYST ASPIRATION Left 4-5 yrs ago   NEG  . CATARACT EXTRACTION  2008  . ENDOSCOPIC VEIN LASER TREATMENT    . UMBILICAL HERNIA REPAIR    . vulvar excision for HPV  07/04/2014     Depression screen [facility] 2/9 03/24/2020 05/06/2017 09/22/2016  Decreased Interest 3 0 0  Down, Depressed, Hopeless 2 0 1  PHQ - 2 Score 5 0 1  Altered sleeping 3 2 3   Tired, decreased energy 3 3 3   Change in appetite 2 1 1   Feeling bad or failure about yourself  2 0 0  Trouble concentrating 1 0 1  Moving slowly or fidgety/restless 0 1 0  Suicidal thoughts 0 0 0  PHQ-9 Score 16 7 9   Difficult doing work/chores Somewhat difficult - Not difficult at all    Social History   Tobacco Use  . Smoking status: Never Smoker  . Smokeless tobacco: Never Used  Substance Use Topics  . Alcohol use: Yes    Alcohol/week: 0.0 - 1.0 standard drinks  . Drug use: No  Review of Systems Per HPI unless specifically indicated above     Objective:    LMP 07/15/2015 (Within Days)   Wt Readings from Last 3 Encounters:  03/24/20 145 lb 9.6 oz (66 kg)  06/28/19 145 lb (65.8 kg)  10/19/18 149 lb 3.2 oz (67.7 kg)    Physical Exam   Note examination was completely remotely via video observation objective data only  Gen - well-appearing, no acute distress or apparent pain, comfortable HEENT - eyes appear clear without discharge or redness Heart/Lungs - cannot examine virtually -  observed no evidence of coughing or labored breathing. Abd - cannot examine virtually  Skin - face visible today- no rash Neuro - awake, alert, oriented Psych - not anxious appearing   Results for orders placed or performed in visit on 04/28/20  HM PAP SMEAR  Result Value Ref Range   HM Pap smear NIL       Assessment & Plan:   Problem List Items Addressed This Visit    None    Visit Diagnoses    Viral gastroenteritis    -  Primary      Consistent with viral gastroenteritis, 3-4 days with acute abdominal pain fever nausea vomiting and diarrhea. No known sick contacts - Currently well appearing and non-toxic Dramatic improvement overall in past 24 hours, reduced abdominal pain, afebrile, nausea vomiting diarrhea has resolved. Improving hydration UTD COVID vaccine  Plan: 1. Reassurance, likely self-limited 7-10 days 2. Continue PO as tolerated. May try to increase hydration, clear fluids (gatorade, pedialyate, water), advance diet as tolerated 3. Continue Tylenol PRN 4. Has existing Zofran PRN Carafate PRN 5. Return criteria reviewed  Work note provided  Since patient is remote today, and dramatic improvement, will hold off on acute lab panel with chemistry and CBC. Can reconsider labs if patient has new concerns.   No orders of the defined types were placed in this encounter.     Follow up plan: Return if symptoms worsen or fail to improve, for abdominal pain nv .   Patient verbalizes understanding with the above medical recommendations including the limitation of remote medical advice.  Specific follow-up and call-back criteria were given for patient to follow-up or seek medical care more urgently if needed.  Total duration of direct patient care provided via video conference: 15 minutes   Nobie Putnam, [facility] 05/27/2020, 9:53 AM

## 2020-05-27 NOTE — Patient Instructions (Addendum)
Thank you for coming to the office today.  Keep on current therapy with hydration and improving appetite.  May take Zofran as needed.  May try carafate as needed  If significant changes or worsening or new concerns - please follow up sooner or seek care more immediately if severe and cannot keep any food/fluid down or worsening concern for dehydration or abdominal pain   Please schedule a Follow-up Appointment to: Return if symptoms worsen or fail to improve, for abdominal pain nv .  If you have any other questions or concerns, please feel free to call the office or send a message through MyChart. You may also schedule an earlier appointment if necessary.  Additionally, you may be receiving a survey about your experience at our office within a few days to 1 week by e-mail or mail. We value your feedback.  Saralyn Pilar, DO [facility], [place]

## 2020-06-07 ENCOUNTER — Other Ambulatory Visit: Payer: Self-pay | Admitting: Family Medicine

## 2020-06-07 DIAGNOSIS — K219 Gastro-esophageal reflux disease without esophagitis: Secondary | ICD-10-CM

## 2020-06-07 NOTE — Telephone Encounter (Signed)
Requested Prescriptions  Pending Prescriptions Disp Refills  . pantoprazole (PROTONIX) 40 MG tablet [Pharmacy Med Name: PANTOPRAZOLE SOD DR 40 MG TAB] 90 tablet 3    Sig: TAKE 1 TABLET BY MOUTH EVERY DAY     There is no refill protocol information for this order

## 2020-06-12 ENCOUNTER — Ambulatory Visit (INDEPENDENT_AMBULATORY_CARE_PROVIDER_SITE_OTHER): Payer: BC Managed Care – PPO | Admitting: Internal Medicine

## 2020-06-12 ENCOUNTER — Other Ambulatory Visit: Payer: Self-pay

## 2020-06-12 ENCOUNTER — Encounter: Payer: Self-pay | Admitting: Internal Medicine

## 2020-06-12 ENCOUNTER — Telehealth: Payer: Self-pay | Admitting: Family Medicine

## 2020-06-12 VITALS — BP 100/60 | HR 78 | Ht 64.0 in | Wt 138.0 lb

## 2020-06-12 DIAGNOSIS — R000 Tachycardia, unspecified: Secondary | ICD-10-CM

## 2020-06-12 DIAGNOSIS — R072 Precordial pain: Secondary | ICD-10-CM | POA: Diagnosis not present

## 2020-06-12 DIAGNOSIS — E782 Mixed hyperlipidemia: Secondary | ICD-10-CM

## 2020-06-12 MED ORDER — METOPROLOL TARTRATE 25 MG PO TABS: 25.0000 mg | ORAL_TABLET | Freq: Two times a day (BID) | ORAL | 3 refills | Status: DC

## 2020-06-12 MED ORDER — IVABRADINE HCL 5 MG PO TABS: ORAL_TABLET | ORAL | 0 refills | Status: DC

## 2020-06-12 NOTE — Progress Notes (Signed)
Patient Care Team: Smitty Cords, DO as PCP - General (Family Medicine)   HPI  Christina Meza is a 54 y.o. 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 Allergy   . Arthritis   . Asthma   . Clostridium difficile infection    2015x2 , 2016x1  . Depression   . Diastolic dysfunction    a. TTE 1/18: EF 50-55%, no RWMA, Gr1DD, mildly dilated aortic root of 3.4 cm, mildly dilated ascending aorta of 3.7 cm, mild mitral regurgitation, normal size left atrium, normal RV systolic function, PASP normal  . Genital warts   . GERD (gastroesophageal reflux disease)   . Inappropriate sinus tachycardia    a. 2010 Nuc Stress test: no ischemia/infarct;  b. 10/2013 Echo: Nl LV fxn, mild MR/TR.  . Migraine   . Palpitations   . Prolonged QT interval   . SLE (systemic lupus erythematosus) (HCC)   . Stomach ulcer     Past Surgical History:  Procedure Laterality Date  . BREAST BIOPSY Right 03/01/2017   u/s core bx neg ADENOSIS AND FOCAL MICROCYST FORMATION.  Marland Kitchen BREAST CYST ASPIRATION Left 4-5 yrs ago   NEG  . CATARACT EXTRACTION  2008  . ENDOSCOPIC VEIN LASER TREATMENT    . UMBILICAL HERNIA REPAIR    . vulvar excision for HPV  07/04/2014    Current Meds  Medication Sig  . Belimumab 200 MG/ML SOAJ Inject 200 mg into the skin.  . Biotin 5000 MCG TABS Take by mouth.  . busPIRone (BUSPAR) 5 MG tablet TAKE 1 TABLET (5 MG TOTAL)  BY MOUTH 2 (TWO) TIMES DAILY AS NEEDED (ANXIETY).  . Cholecalciferol (VITAMIN D3) 3000 units TABS Take by mouth daily.  . diclofenac sodium (VOLTAREN) 1 % GEL Apply 1 application topically as needed.  . DULoxetine (CYMBALTA) 60 MG capsule Take 1 capsule (60 mg total) by mouth daily.  Marland Kitchen FLOVENT DISKUS 100 MCG/BLIST AEPB INHALE 1 PUFF INTO THE LUNGS TWICE A DAY  . folic acid (FOLVITE) 1 MG tablet Take 1 mg by mouth daily.  . hydroxychloroquine (PLAQUENIL) 200 MG tablet Take 200 mg by mouth 2 (two) times daily. Alternating 200 mg once a day and next day twice a day.  . ipratropium (ATROVENT) 0.06 % nasal spray PLACE 2 SPRAYS INTO BOTH NOSTRILS 4 (FOUR) TIMES DAILY. FOR UP TO 5-7 DAYS THEN STOP.  Marland Kitchen levalbuterol (XOPENEX HFA) 45 MCG/ACT inhaler Inhale 1 puff into the lungs every 4 (four) hours as needed for wheezing. Reported on 03/14/2016  . magnesium oxide (MAG-OX) 400 MG tablet Take 1 tablet (400 mg total) by mouth daily.  . methotrexate (RHEUMATREX) 2.5 MG tablet Take 2.5 mg SIX tablets every Monday.  . metoprolol tartrate (LOPRESSOR) 25 MG tablet Take 1 tablet (25 mg  total) by mouth 2 (two) times daily.  . Multiple Vitamins-Minerals (MULTIVITAMIN WITH MINERALS) tablet Take 1 tablet by mouth every morning.   . naproxen sodium (ALEVE) 220 MG tablet Take 1 tablet (220 mg total) by mouth at bedtime.  . ondansetron (ZOFRAN) 4 MG tablet Take 1 tablet (4 mg total) by mouth every 8 (eight) hours as needed for nausea or vomiting.  . pantoprazole (PROTONIX) 40 MG tablet TAKE 1 TABLET BY MOUTH EVERY DAY  . Probiotic Product (PROBIOTIC DAILY PO) Take 1 tablet by mouth daily.   Marland Kitchen triamcinolone cream (KENALOG) 0.5 % Apply 1 application topically 2 (two) times daily. To affected areas, for up to 2 weeks.  . valACYclovir (VALTREX) 500 MG tablet Take 500 mg by mouth at bedtime.  . verapamil (CALAN) 40 MG tablet Take one tablet every 8 hours as needed for Raynaud's  . vitamin E 400 UNIT capsule Take 400 Units by  mouth daily.  Marland Kitchen zolpidem (AMBIEN CR) 6.25 MG CR tablet Take 1 tablet (6.25 mg total) by mouth at bedtime as needed for sleep.    Allergies  Allergen Reactions  . Lansoprazole Swelling and Hives    Lips and tongue  . Celebrex [Celecoxib] Other (See Comments) and Itching    Recommended not taking due to a stomach issue.   . Sulfa Antibiotics Itching and Hives  . Sulfamethoxazole-Trimethoprim Itching and Hives  . Furosemide Itching and Hives      Review of Systems negative except from HPI and PMH  Physical Exam BP 100/60 (BP Location: Right Arm, Patient Position: Sitting, Cuff Size: Normal)   Pulse 78   Ht 5\' 4"  (1.626 m)   Wt 138 lb (62.6 kg)   LMP 07/15/2015 (Within Days)   SpO2 98%   BMI 23.69 kg/m  Well developed and nourished in no acute distress HENT normal Neck supple with JVP-  flat  Clear Regular rate and rhythm, no murmurs or gallops Abd-soft with active BS No Clubbing cyanosis edema Skin-warm and dry A & Oriented  Grossly normal sensory and motor function  ECG sinus@ 87 15/10/41 rsr'      CrCl cannot be calculated (Patient's most recent lab result is older than the maximum 21 days allowed.).   Assessment and  Plan  Inappropriate sinus tachycardia  Lupus  Exertional chest pain new onset  Raynaud's phenomenon   Tachycardia is much improved on metoprolol and is not aggravating her Raynauds  Her chest pain is concerning for coronary atherosclerosis/angina particularly in the context of her lupus and ongoing glucocorticoid therapy both of which contribute significantly to risks.  The fact that she had to have her steroids resumed suggest that the underlying inflammatory processes remain active, certainly potentially contributing to atherosclerosis  We will undertake CTA + FFR and have discussed the implications of both positive and negative findings either being really important going forward.  Raynauds is managed         Current medicines  are reviewed at length with the patient today .  The patient does not  have concerns regarding medicines.

## 2020-06-12 NOTE — Telephone Encounter (Signed)
Received Fax from [facility] for [facility] recertification. Last done 02/2019.  Completed form.  Key information  [x]  Medically necessary to work less or intermittent schedule  A) continuous leave - N/A B) Medical visits/treatment - 6 per year, lasting 1 day per C) Recurring episodes: 1-2 times per [x]  1 month, lasting 4 days per D) reduced schedule - N/A  Completed form is in my INBOX (not ready to be faxed yet)  It will need to be Scanned to chart and also - patient may need to sign form first in person then we can fax it OR if she says it is okay to fax without her signature that is fine too.  I attempted to call her and left a voicemail and sent mychart message  , DO [facility] 06/12/2020, 3:22 PM

## 2020-06-12 NOTE — Patient Instructions (Addendum)
Medication Instructions:  - Your physician recommends that you continue on your current medications as directed. Please refer to the Current Medication list given to you today.  *If you need a refill on your cardiac medications before your next appointment, please call your pharmacy*   Lab Work: - Your physician recommends that you have lab work with your next rheumatology appointment: Lipid panel (please take the written order given to you today)   If you have labs (blood work) drawn today and your tests are completely normal, you will receive your results only by: Marland Kitchen MyChart Message (if you have MyChart) OR . A paper copy in the mail If you have any lab test that is abnormal or we need to change your treatment, we will call you to review the results.   Testing/Procedures: - Your physician has requested that you have cardiac CT. Cardiac computed tomography (CT) is a painless test that uses an x-ray machine to take clear, detailed pictures of your heart.   Your cardiac CT will be scheduled at one of the below locations:   [facility] [address] [place], [place] [address] [phone]  [facility] [address] [place],  [address] [phone]  If scheduled at [facility], please arrive at the [facility] main entrance of [facility] 30 minutes prior to test start time. Proceed to the [facility] Radiology Department (first floor) to check-in and test prep.  If scheduled at [facility], please arrive 15 mins early for check-in and test prep.  Please follow these instructions carefully (unless otherwise directed):   On the Night Before the Test: . Be sure to Drink plenty of water. . Do not consume any caffeinated/decaffeinated beverages or chocolate 12 hours prior to your test. . Do not take any antihistamines 12 hours prior to your test. .  On the Day of the  Test: . Drink plenty of water. Do not drink any water within one hour of the test. . Do not eat any food 4 hours prior to the test. . You may take your regular medications prior to the test.  . Take metoprolol (Lopressor) 50 mgx 1 dose 1-2 hours prior to test. . Take ivabradine (Corlanor) 10 mg x 1 dose 1-2 hours prior to test . FEMALES- please wear underwire-free bra if available       After the Test: . Drink plenty of water. . After receiving IV contrast, you may experience a mild flushed feeling. This is normal. . On occasion, you may experience a mild rash up to 24 hours after the test. This is not dangerous. If this occurs, you can take Benadryl 25 mg and increase your fluid intake. . If you experience trouble breathing, this can be serious. If it is severe call 911 IMMEDIATELY. If it is mild, please call our office. . If you take any of these medications: Glipizide/Metformin, Avandament, Glucavance, please do not take 48 hours after completing test unless otherwise instructed.   Once we have confirmed authorization from your insurance company, we will call you to set up a date and time for your test. Based on how quickly your insurance processes prior authorizations requests, please allow up to 4 weeks to be contacted for scheduling your Cardiac CT appointment. Be advised that routine Cardiac CT appointments could be scheduled as many as 8 weeks after your provider has ordered it.  For non-scheduling related questions, please contact the  cardiac imaging nurse navigator should you have any questions/concerns: Marchia Bond, Cardiac Imaging Nurse Navigator Burley Saver, Interim Cardiac Imaging Nurse Navigator [facility] Direct Office Dial: [phone]   For scheduling needs, including cancellations and rescheduling, please call Vivien Rota at [phone], option [phone].     Follow-Up: At [facility], you and your health needs are our priority.  As part of our  continuing mission to provide you with exceptional heart care, we have created designated Provider Care Teams.  These Care Teams include your primary Cardiologist (physician) and Advanced Practice Providers (APPs -  Physician Assistants and Nurse Practitioners) who all work together to provide you with the care you need, when you need it.  We recommend signing up for the patient portal called "MyChart".  Sign up information is provided on this After Visit Summary.  MyChart is used to connect with patients for Virtual Visits (Telemedicine).  Patients are able to view lab/test results, encounter notes, upcoming appointments, etc.  Non-urgent messages can be sent to your provider as well.   To learn more about what you can do with MyChart, go to [email or web address].    Your next appointment:   1 year(s)  The format for your next appointment:   In Person  Provider:   Virl Axe, MD   Other Instructions    Cardiac CT Angiogram A cardiac CT angiogram is a procedure to look at the heart and the area around the heart. It may be done to help find the cause of chest pains or other symptoms of heart disease. During this procedure, a substance called contrast dye is injected into the blood vessels in the area to be checked. A large X-ray machine, called a CT scanner, then takes detailed pictures of the heart and the surrounding area. The procedure is also sometimes called a coronary CT angiogram, coronary artery scanning, or CTA. A cardiac CT angiogram allows the health care provider to see how well blood is flowing to and from the heart. The health care provider will be able to see if there are any problems, such as:  Blockage or narrowing of the coronary arteries in the heart.  Fluid around the heart.  Signs of weakness or disease in the muscles, valves, and tissues of the heart. Tell a health care provider about:  Any allergies you have. This is especially important if you have had a  previous allergic reaction to contrast dye.  All medicines you are taking, including vitamins, herbs, eye drops, creams, and over-the-counter medicines.  Any blood disorders you have.  Any surgeries you have had.  Any medical conditions you have.  Whether you are pregnant or may be pregnant.  Any anxiety disorders, chronic pain, or other conditions you have that may increase your stress or prevent you from lying still. What are the risks? Generally, this is a safe procedure. However, problems may occur, including:  Bleeding.  Infection.  Allergic reactions to medicines or dyes.  Damage to other structures or organs.  Kidney damage from the contrast dye that is used.  Increased risk of cancer from radiation exposure. This risk is low. Talk with your health care provider about: ? The risks and benefits of testing. ? How you can receive the lowest dose of radiation. What happens before the procedure?  Wear comfortable clothing and remove any jewelry, glasses, dentures, and hearing aids.  Follow instructions from your health care provider about eating and drinking. This may include: ? For 12 hours before  the procedure -- avoid caffeine. This includes tea, coffee, soda, energy drinks, and diet pills. Drink plenty of water or other fluids that do not have caffeine in them. Being well hydrated can prevent complications. ? For 4-6 hours before the procedure -- stop eating and drinking. The contrast dye can cause nausea, but this is less likely if your stomach is empty.  Ask your health care provider about changing or stopping your regular medicines. This is especially important if you are taking diabetes medicines, blood thinners, or medicines to treat problems with erections (erectile dysfunction). What happens during the procedure?   Hair on your chest may need to be removed so that small sticky patches called electrodes can be placed on your chest. These will transmit information  that helps to monitor your heart during the procedure.  An IV will be inserted into one of your veins.  You might be given a medicine to control your heart rate during the procedure. This will help to ensure that good images are obtained.  You will be asked to lie on an exam table. This table will slide in and out of the CT machine during the procedure.  Contrast dye will be injected into the IV. You might feel warm, or you may get a metallic taste in your mouth.  You will be given a medicine called nitroglycerin. This will relax or dilate the arteries in your heart.  The table that you are lying on will move into the CT machine tunnel for the scan.  The person running the machine will give you instructions while the scans are being done. You may be asked to: ? Keep your arms above your head. ? Hold your breath. ? Stay very still, even if the table is moving.  When the scanning is complete, you will be moved out of the machine.  The IV will be removed. The procedure may vary among health care providers and hospitals. What can I expect after the procedure? After your procedure, it is common to have:  A metallic taste in your mouth from the contrast dye.  A feeling of warmth.  A headache from the nitroglycerin. Follow these instructions at home:  Take over-the-counter and prescription medicines only as told by your health care provider.  If you are told, drink enough fluid to keep your urine pale yellow. This will help to flush the contrast dye out of your body.  Most people can return to their normal activities right after the procedure. Ask your health care provider what activities are safe for you.  It is up to you to get the results of your procedure. Ask your health care provider, or the department that is doing the procedure, when your results will be ready.  Keep all follow-up visits as told by your health care provider. This is important. Contact a health care provider  if:  You have any symptoms of allergy to the contrast dye. These include: ? Shortness of breath. ? Rash or hives. ? A racing heartbeat. Summary  A cardiac CT angiogram is a procedure to look at the heart and the area around the heart. It may be done to help find the cause of chest pains or other symptoms of heart disease.  During this procedure, a large X-ray machine, called a CT scanner, takes detailed pictures of the heart and the surrounding area after a contrast dye has been injected into blood vessels in the area.  Ask your health care provider about changing or  stopping your regular medicines before the procedure. This is especially important if you are taking diabetes medicines, blood thinners, or medicines to treat erectile dysfunction.  If you are told, drink enough fluid to keep your urine pale yellow. This will help to flush the contrast dye out of your body. This information is not intended to replace advice given to you by your health care provider. Make sure you discuss any questions you have with your health care provider. Document Revised: 07/18/2019 Document Reviewed: 07/18/2019 Elsevier Patient Education  [place].

## 2020-06-13 NOTE — Telephone Encounter (Signed)
Patient did reply through my chart see more detail in her message,  "Would you be willing to fax it to me ? I can sign it and fax it to [employer].  Fax [phone]."

## 2020-06-13 NOTE — Telephone Encounter (Signed)
There is a current MyChart conversation with this same topic.  We attempted to fax the paperwork to [facility] at the requested fax # and it did not successfully go through.  We can try again this afternoon, and Janice Coffin will respond to her mychart message to correspond with patient.  Saralyn Pilar, DO [facility] 06/13/2020, 12:16 PM

## 2020-06-17 ENCOUNTER — Ambulatory Visit (INDEPENDENT_AMBULATORY_CARE_PROVIDER_SITE_OTHER): Payer: BC Managed Care – PPO | Admitting: Psychology

## 2020-06-17 ENCOUNTER — Ambulatory Visit
Admission: RE | Admit: 2020-06-17 | Discharge: 2020-06-17 | Disposition: A | Discharge status: Home / Self Care | Payer: BC Managed Care – PPO | Source: Ambulatory Visit | Attending: Family Medicine | Admitting: Family Medicine

## 2020-06-17 DIAGNOSIS — F4323 Adjustment disorder with mixed anxiety and depressed mood: Secondary | ICD-10-CM

## 2020-06-17 DIAGNOSIS — Z1231 Encounter for screening mammogram for malignant neoplasm of breast: Secondary | ICD-10-CM | POA: Diagnosis not present

## 2020-06-20 ENCOUNTER — Other Ambulatory Visit: Payer: Self-pay | Admitting: Internal Medicine

## 2020-06-27 ENCOUNTER — Ambulatory Visit: Payer: BC Managed Care – PPO | Admitting: Psychology

## 2020-07-11 ENCOUNTER — Ambulatory Visit (INDEPENDENT_AMBULATORY_CARE_PROVIDER_SITE_OTHER): Payer: BC Managed Care – PPO | Admitting: Psychology

## 2020-07-11 DIAGNOSIS — F4323 Adjustment disorder with mixed anxiety and depressed mood: Secondary | ICD-10-CM | POA: Diagnosis not present

## 2020-07-24 ENCOUNTER — Ambulatory Visit (INDEPENDENT_AMBULATORY_CARE_PROVIDER_SITE_OTHER): Payer: BC Managed Care – PPO | Admitting: Psychology

## 2020-07-24 DIAGNOSIS — F4323 Adjustment disorder with mixed anxiety and depressed mood: Secondary | ICD-10-CM | POA: Diagnosis not present

## 2020-07-31 ENCOUNTER — Ambulatory Visit: Payer: BC Managed Care – PPO

## 2020-08-25 ENCOUNTER — Ambulatory Visit (INDEPENDENT_AMBULATORY_CARE_PROVIDER_SITE_OTHER): Payer: BC Managed Care – PPO | Admitting: Psychology

## 2020-08-25 DIAGNOSIS — F4323 Adjustment disorder with mixed anxiety and depressed mood: Secondary | ICD-10-CM | POA: Diagnosis not present

## 2020-08-26 ENCOUNTER — Telehealth (HOSPITAL_COMMUNITY): Payer: Self-pay | Admitting: Emergency Medicine

## 2020-08-26 NOTE — Telephone Encounter (Signed)
Reaching out to patient to offer assistance regarding upcoming cardiac imaging study; pt verbalizes understanding of appt date/time, parking situation and where to check in, pre-test NPO status and medications ordered, and verified current allergies; name and call back number provided for further questions should they arise Rockwell Alexandria RN Navigator Cardiac Imaging [facility] [phone] office [phone] cell   50mg  metoprolol + 10mg  ivabradine

## 2020-08-28 ENCOUNTER — Ambulatory Visit
Admission: RE | Admit: 2020-08-28 | Discharge: 2020-08-28 | Disposition: A | Discharge status: Home / Self Care | Payer: BC Managed Care – PPO | Source: Ambulatory Visit | Attending: Internal Medicine | Admitting: Internal Medicine

## 2020-08-28 ENCOUNTER — Other Ambulatory Visit: Payer: Self-pay

## 2020-08-28 DIAGNOSIS — R072 Precordial pain: Secondary | ICD-10-CM

## 2020-08-28 MED ORDER — DILTIAZEM HCL 25 MG/5ML IV SOLN
10.0000 mg | Freq: Once | INTRAVENOUS | Status: AC
  Administered 2020-08-28: 10 mg via INTRAVENOUS

## 2020-08-28 MED ORDER — NITROGLYCERIN 0.4 MG SL SUBL
0.4000 mg | SUBLINGUAL_TABLET | Freq: Once | SUBLINGUAL | Status: AC
  Administered 2020-08-28: 0.4 mg via SUBLINGUAL

## 2020-08-28 MED ORDER — IOHEXOL 350 MG/ML SOLN
75.0000 mL | Freq: Once | INTRAVENOUS | Status: AC | PRN
  Administered 2020-08-28: 75 mL via INTRAVENOUS

## 2020-08-28 NOTE — Progress Notes (Signed)
Patient tolerated procedure well. Ambulate w/o difficulty. Sitting in chair drinking water provided. Encouraged to drink extra water today and reasoning. Verbalized understanding. All questions answered. ABC intact. No further needs. Discharge from procedure area w/o issues. BP to baseline.

## 2020-09-03 ENCOUNTER — Telehealth: Payer: Self-pay | Admitting: Internal Medicine

## 2020-09-03 NOTE — Telephone Encounter (Signed)
Per Dr. Graciela Husbands:  Please Inform Patient that study was normal  With CaScore 0 this is great news!!!!     Thanks

## 2020-09-03 NOTE — Telephone Encounter (Signed)
I spoke with the patient regarding her Cardiac CT results. She voices understanding.  She did tell me that she had her lipid profile checked at [facility] in August as per Dr. Odessa Fleming recommendations. She wanted to know if he is was ok with her Triglyceride numbers.  I pulled her Lipid panel dated 07/24/20 as below and advised her I will review results with Dr. Graciela Husbands and call her back with his recommendations. The patient is agreeable.   LIPID PROFILE [facility] 07/24/2020 Component    Triglycerides  Cholesterol  HDL  LDL Calculated  VLDL Cholesterol Cal  Chol/HDL Ratio  Non-HDL Cholesterol  FASTING  Component 07/24/2020     Triglycerides 251High    Cholesterol 177  HDL 37Low    LDL Calculated 90  VLDL Cholesterol Cal 50.2High    Chol/HDL Ratio 4.8High    Non-HDL Cholesterol 140High     FASTING Yes

## 2020-09-04 NOTE — Telephone Encounter (Signed)
A brief review suggests that moderate hypertriglyceridemia, less than 885, does not require therapy in the absence of elevated cardiovascular risk.  In her situation, with a calcium score of zero, her cardiovascular risk would be expected to be very low.  This is further supported by her LDL of less than 100.  Hypertriglyceridemia can be increased with carb intake, alcohol intake, and some other metabolic abnormalities.  Exercise is recommended weight loss is too but that does not matter here  At this point would do nothing specific.  No therapy is recommended in the absence of a high cardiovascular risk.

## 2020-09-04 NOTE — Telephone Encounter (Signed)
Attempted to call the patient. No answer- I left a detailed message of Dr. Odessa Fleming recommendations as listed below on her identified voice mail. Ok per [employer].  I asked that she call back with any further questions/ concerns.

## 2020-09-23 ENCOUNTER — Ambulatory Visit (INDEPENDENT_AMBULATORY_CARE_PROVIDER_SITE_OTHER): Payer: BC Managed Care – PPO | Admitting: Psychology

## 2020-09-23 DIAGNOSIS — F4323 Adjustment disorder with mixed anxiety and depressed mood: Secondary | ICD-10-CM | POA: Diagnosis not present

## 2020-11-05 NOTE — Telephone Encounter (Signed)
Looks like her insomnia has not been addressed in a while.  Can she schedule a visit for when Dr. Kirtland Bouchard returns to clinic?  TY

## 2020-11-11 ENCOUNTER — Ambulatory Visit (INDEPENDENT_AMBULATORY_CARE_PROVIDER_SITE_OTHER): Payer: BC Managed Care – PPO | Admitting: Psychology

## 2020-11-11 DIAGNOSIS — F4323 Adjustment disorder with mixed anxiety and depressed mood: Secondary | ICD-10-CM

## 2020-12-24 ENCOUNTER — Other Ambulatory Visit: Payer: Self-pay

## 2020-12-24 DIAGNOSIS — F419 Anxiety disorder, unspecified: Secondary | ICD-10-CM

## 2020-12-24 DIAGNOSIS — F32A Depression, unspecified: Secondary | ICD-10-CM

## 2020-12-24 MED ORDER — DULOXETINE HCL 60 MG PO CPEP: 60.0000 mg | ORAL_CAPSULE | Freq: Every day | ORAL | 3 refills | Status: DC

## 2020-12-30 ENCOUNTER — Ambulatory Visit: Payer: No Typology Code available for payment source | Admitting: Psychology

## 2021-01-01 ENCOUNTER — Ambulatory Visit
Admission: EM | Admit: 2021-01-01 | Discharge: 2021-01-01 | Disposition: A | Discharge status: Home / Self Care | Payer: No Typology Code available for payment source | Attending: Family Medicine | Admitting: Family Medicine

## 2021-01-01 ENCOUNTER — Other Ambulatory Visit: Payer: Self-pay

## 2021-01-01 DIAGNOSIS — L03019 Cellulitis of unspecified finger: Secondary | ICD-10-CM | POA: Diagnosis not present

## 2021-01-01 MED ORDER — MUPIROCIN CALCIUM 2 % EX CREA
1.0000 | TOPICAL_CREAM | Freq: Two times a day (BID) | CUTANEOUS | 0 refills | Status: DC
Start: 2021-01-01 — End: 2022-06-01

## 2021-01-01 NOTE — ED Triage Notes (Signed)
Pt sts she had a hangnail on thumb and index fingers on L hand, she began to pick at them and now fingers are infected.

## 2021-01-01 NOTE — Discharge Instructions (Addendum)
Apply the Bactroban ointment twice a day.  Keep doing the warm soaks. Follow up as needed for continued or worsening symptoms

## 2021-01-02 NOTE — ED Provider Notes (Signed)
[facility]    CSN: [id number] Arrival date & time: 01/01/21  1259      History   Chief Complaint Chief Complaint  Patient presents with  . Hand Pain    HPI Katilynn Sinkler is a 55 y.o. female.   55 year old female presents today with multiple hangnails and paronychia to left thumb and index fingers.  Reporting she has been picking at the fingers.  The swelling and erythema was worse until she opened the areas up and drained on her own.  She has been doing warm soaks.  The symptoms have improved.  No fevers, chills.     Past Medical History:  Diagnosis Date  . Allergy   . Arthritis   . Asthma   . Clostridium difficile infection    2015x2 , 2016x1  . Depression   . Diastolic dysfunction    a. TTE 1/18: EF 50-55%, no RWMA, Gr1DD, mildly dilated aortic root of 3.4 cm, mildly dilated ascending aorta of 3.7 cm, mild mitral regurgitation, normal size left atrium, normal RV systolic function, PASP normal  . Genital warts   . GERD (gastroesophageal reflux disease)   . Inappropriate sinus tachycardia    a. 2010 Nuc Stress test: no ischemia/infarct;  b. 10/2013 Echo: Nl LV fxn, mild MR/TR.  . Migraine   . Palpitations   . Prolonged QT interval   . SLE (systemic lupus erythematosus) (HCC)   . Stomach ulcer     Patient Active Problem List   Diagnosis Date Noted  . GAD (generalized anxiety disorder) 03/24/2020  . Breast mass, right 02/23/2017  . Acute pain of right wrist 11/23/2016  . Prolonged QT interval   . Axillary lymphadenopathy 09/22/2016  . Hyperlipidemia 08/10/2016  . HSV-2 (herpes simplex virus 2) infection 08/10/2016  . Neck pain 06/18/2016  . Bilateral wrist pain 05/24/2016  . Long term current use of systemic steroids 12/16/2015  . Raynaud's disease without gangrene 12/16/2015  . Rheumatoid factor positive 12/16/2015  . Inappropriate sinus tachycardia 11/17/2015  . Sinus tachycardia 11/12/2015  . SLE (systemic lupus erythematosus) (HCC)  11/11/2015  . Preventative health care 11/11/2015  . Depression, major, recurrent, moderate (HCC) 11/11/2015  . Moderate persistent asthma 11/11/2015  . GERD (gastroesophageal reflux disease) 11/11/2015  . Insomnia 11/11/2015    Past Surgical History:  Procedure Laterality Date  . BREAST BIOPSY Right 03/01/2017   u/s core bx neg ADENOSIS AND FOCAL MICROCYST FORMATION.  Marland Kitchen BREAST CYST ASPIRATION Left 4-5 yrs ago   NEG  . CATARACT EXTRACTION  2008  . ENDOSCOPIC VEIN LASER TREATMENT    . UMBILICAL HERNIA REPAIR    . vulvar excision for HPV  07/04/2014    OB History   No obstetric history on file.      Home Medications    Prior to Admission medications   Medication Sig Start Date End Date Taking? Authorizing Provider  mupirocin cream (BACTROBAN) 2 % Apply 1 application topically 2 (two) times daily. 01/01/21  Yes Citlali Gautney A, NP  Belimumab 200 MG/ML SOAJ Inject 200 mg into the skin. 01/25/17   [provider]  Biotin 5000 MCG TABS Take by mouth.    [provider]  busPIRone (BUSPAR) 5 MG tablet TAKE 1 TABLET (5 MG TOTAL) BY MOUTH 2 (TWO) TIMES DAILY AS NEEDED (ANXIETY). 04/15/20   Karamalegos, Netta Neat, DO  Cholecalciferol (VITAMIN D3) 3000 units TABS Take by mouth daily.    [provider]  diclofenac sodium (VOLTAREN) 1 % GEL Apply  1 application topically as needed.    [provider]  DULoxetine (CYMBALTA) 60 MG capsule Take 1 capsule (60 mg total) by mouth daily. 12/24/20   Karamalegos, Netta Neat, DO  FLOVENT DISKUS 100 MCG/BLIST AEPB INHALE 1 PUFF INTO THE LUNGS TWICE A DAY 08/19/19   Karamalegos, Netta Neat, DO  folic acid (FOLVITE) 1 MG tablet Take 1 mg by mouth daily. 03/10/20   [provider]  hydroxychloroquine (PLAQUENIL) 200 MG tablet Take 200 mg by mouth 2 (two) times daily. Alternating 200 mg once a day and next day twice a day.    [provider]  ipratropium (ATROVENT) 0.06 % nasal spray PLACE 2 SPRAYS INTO  BOTH NOSTRILS 4 (FOUR) TIMES DAILY. FOR UP TO 5-7 DAYS THEN STOP. 10/15/19   Karamalegos, Netta Neat, DO  ivabradine (CORLANOR) 5 MG TABS tablet Take 2 tablets (10 mg) by mouth x 1 dose 1-2 hours prior to your Cardiac CT 06/12/20   Duke Salvia, MD  levalbuterol [facility] HFA) 45 MCG/ACT inhaler Inhale 1 puff into the lungs every 4 (four) hours as needed for wheezing. Reported on 03/14/2016    [provider]  magnesium oxide (MAG-OX) 400 MG tablet Take 1 tablet (400 mg total) by mouth daily. 12/14/17   Sondra Barges, PA-C  methotrexate (RHEUMATREX) 2.5 MG tablet Take 2.5 mg SIX tablets every Monday. 12/30/17   [provider]  metoprolol tartrate (LOPRESSOR) 25 MG tablet TAKE 1 TABLET BY MOUTH TWICE A DAY 06/20/20   Duke Salvia, MD  Multiple Vitamins-Minerals (MULTIVITAMIN WITH MINERALS) tablet Take 1 tablet by mouth every morning.     [provider]  naproxen sodium (ALEVE) 220 MG tablet Take 1 tablet (220 mg total) by mouth at bedtime. 08/10/16   Karamalegos, Netta Neat, DO  ondansetron (ZOFRAN) 4 MG tablet Take 1 tablet (4 mg total) by mouth every 8 (eight) hours as needed for nausea or vomiting. 05/21/16   Tommie Sams, DO  pantoprazole (PROTONIX) 40 MG tablet TAKE 1 TABLET BY MOUTH EVERY DAY 06/07/20   Althea Charon, Netta Neat, DO  Probiotic Product (PROBIOTIC DAILY PO) Take 1 tablet by mouth daily.     [provider]  triamcinolone cream (KENALOG) 0.5 % Apply 1 application topically 2 (two) times daily. To affected areas, for up to 2 weeks. 03/16/17   Karamalegos, Netta Neat, DO  valACYclovir (VALTREX) 500 MG tablet Take 500 mg by mouth at bedtime.    [provider]  verapamil (CALAN) 40 MG tablet Take one tablet every 8 hours as needed for Raynaud's 04/24/18   Duke Salvia, MD  vitamin E 400 UNIT capsule Take 400 Units by mouth daily.    [provider]  zolpidem (AMBIEN CR) 6.25 MG CR tablet Take 1 tablet (6.25 mg total) by mouth at bedtime  as needed for sleep. 01/18/18   Smitty Cords, DO    Family History Family History  Problem Relation Age of Onset  . Arthritis Mother   . Bipolar disorder Mother   . Depression Mother   . Mental illness Mother   . Hypertension Maternal Grandmother   . Hyperlipidemia Maternal Grandfather   . Heart disease Maternal Grandfather   . Stroke Maternal Grandfather   . Hypertension Maternal Grandfather   . Colon cancer Paternal Grandfather   . Breast cancer Neg Hx     Social History Social History   Tobacco Use  . Smoking status: Never Smoker  . Smokeless tobacco: Never  Used  Substance Use Topics  . Alcohol use: Yes    Alcohol/week: 0.0 - 1.0 standard drinks  . Drug use: No     Allergies   Lansoprazole, Celebrex [celecoxib], Sulfa antibiotics, Sulfamethoxazole-trimethoprim, and Furosemide   Review of Systems Review of Systems   Physical Exam Triage Vital Signs ED Triage Vitals  Enc Vitals Group     BP 01/01/21 1310 103/61     Pulse Rate 01/01/21 1310 91     Resp 01/01/21 1310 16     Temp 01/01/21 1310 98.9 F (37.2 C)     Temp Source 01/01/21 1310 Oral     SpO2 01/01/21 1310 97 %     Weight 01/01/21 1311 129 lb (58.5 kg)     Height 01/01/21 1311 5\' 4"  (1.626 m)     Head Circumference --      Peak Flow --      Pain Score 01/01/21 1311 7     Pain Loc --      Pain Edu? --      Excl. in GC? --    No data found.  Updated Vital Signs BP 103/61   Pulse 91   Temp 98.9 F (37.2 C) (Oral)   Resp 16   Ht 5\' 4"  (1.626 m)   Wt 129 lb (58.5 kg)   LMP 07/15/2015 (Within Days)   SpO2 97%   BMI 22.14 kg/m   Visual Acuity Right Eye Distance:   Left Eye Distance:   Bilateral Distance:    Right Eye Near:   Left Eye Near:    Bilateral Near:     Physical Exam Vitals and nursing note reviewed.  Constitutional:      General: She is not in acute distress.    Appearance: Normal appearance. She is not ill-appearing, toxic-appearing or diaphoretic.  HENT:      Head: Normocephalic.     Nose: Nose normal.     Mouth/Throat:     Pharynx: Oropharynx is clear.  Eyes:     Conjunctiva/sclera: Conjunctivae normal.  Pulmonary:     Effort: Pulmonary effort is normal.  Musculoskeletal:        General: Normal range of motion.     Cervical back: Normal range of motion.  Skin:    General: Skin is warm and dry.     Findings: No rash.     Comments: Mild erythema and swelling to nailbeds of multiple fingers on the left hand. No discoloration, significant swelling or felon.   Neurological:     Mental Status: She is alert.  Psychiatric:        Mood and Affect: Mood normal.      UC Treatments / Results  Labs (all labs ordered are listed, but only abnormal results are displayed) Labs Reviewed - No data to display  EKG   Radiology No results found.  Procedures Procedures (including critical care time)  Medications Ordered in UC Medications - No data to display  Initial Impression / Assessment and Plan / UC Course  I have reviewed the triage vital signs and the nursing notes.  Pertinent labs & imaging results that were available during my care of the patient were reviewed by me and considered in my medical decision making (see chart for details).     Multiple paronychias to fingers of the left hand.  Symptoms are mild.  Will prescribe Bactroban ointment twice a day.  Recommended continue the warm soaks. Follow up as needed for continued or worsening symptoms  Final Clinical Impressions(s) / UC Diagnoses   Final diagnoses:  Paronychia of finger, unspecified laterality     Discharge Instructions     Apply the Bactroban ointment twice a day.  Keep doing the warm soaks. Follow up as needed for continued or worsening symptoms     ED Prescriptions    Medication Sig Dispense Auth. Provider   mupirocin cream (BACTROBAN) 2 % Apply 1 application topically 2 (two) times daily. 15 g Dahlia Byes A, NP     PDMP not reviewed this  encounter.   Janace Aris, NP 01/02/21 [phone]

## 2021-03-21 ENCOUNTER — Other Ambulatory Visit: Payer: Self-pay | Admitting: Family Medicine

## 2021-03-21 DIAGNOSIS — K219 Gastro-esophageal reflux disease without esophagitis: Secondary | ICD-10-CM

## 2021-03-22 NOTE — Telephone Encounter (Signed)
Requested medication (s) are due for refill today: no  Requested medication (s) are on the active medication list: yes  Last refill:  06/07/20  Future visit scheduled: no  Notes to clinic:  no refill protocol for this medication   Requested Prescriptions  Pending Prescriptions Disp Refills   pantoprazole (PROTONIX) 40 MG tablet [Pharmacy Med Name: PANTOPRAZOLE SOD DR 40 MG TAB] 90 tablet 3    Sig: TAKE 1 TABLET BY MOUTH EVERY DAY      There is no refill protocol information for this order

## 2021-05-27 ENCOUNTER — Other Ambulatory Visit: Payer: Self-pay | Admitting: Family Medicine

## 2021-05-27 ENCOUNTER — Other Ambulatory Visit: Payer: Self-pay | Admitting: Internal Medicine

## 2021-05-27 DIAGNOSIS — Z1231 Encounter for screening mammogram for malignant neoplasm of breast: Secondary | ICD-10-CM

## 2021-06-17 ENCOUNTER — Ambulatory Visit (INDEPENDENT_AMBULATORY_CARE_PROVIDER_SITE_OTHER): Payer: No Typology Code available for payment source | Admitting: Family Medicine

## 2021-06-17 ENCOUNTER — Other Ambulatory Visit: Payer: Self-pay

## 2021-06-17 ENCOUNTER — Encounter: Payer: Self-pay | Admitting: Family Medicine

## 2021-06-17 VITALS — BP 111/65 | HR 77 | Ht 64.0 in | Wt 138.8 lb

## 2021-06-17 DIAGNOSIS — G4709 Other insomnia: Secondary | ICD-10-CM

## 2021-06-17 DIAGNOSIS — F331 Major depressive disorder, recurrent, moderate: Secondary | ICD-10-CM

## 2021-06-17 DIAGNOSIS — R000 Tachycardia, unspecified: Secondary | ICD-10-CM

## 2021-06-17 DIAGNOSIS — F411 Generalized anxiety disorder: Secondary | ICD-10-CM

## 2021-06-17 DIAGNOSIS — Z0000 Encounter for general adult medical examination without abnormal findings: Secondary | ICD-10-CM

## 2021-06-17 DIAGNOSIS — M329 Systemic lupus erythematosus, unspecified: Secondary | ICD-10-CM

## 2021-06-17 MED ORDER — BUSPIRONE HCL 5 MG PO TABS: 5.0000 mg | ORAL_TABLET | Freq: Three times a day (TID) | ORAL | 1 refills | Status: DC | PRN

## 2021-06-17 MED ORDER — TRAZODONE HCL 50 MG PO TABS: 50.0000 mg | ORAL_TABLET | Freq: Every day | ORAL | 2 refills | Status: DC

## 2021-06-17 NOTE — Progress Notes (Signed)
Subjective:    Patient ID: Christina Meza, female    DOB: [birth year], 55 y.o.   MRN: [id number]  Christina Meza is a 55 y.o. female presenting on 06/17/2021 for Annual Exam   HPI  Here for Annual Physical   Anxiety / Chronic Depression recurrent, moderate Insomnia Still difficulty with insomnia sleep maintenance, previously was on Ambien CR 6.25mg  in past and she would sleep with it but caused sluggishness and grogginess in afternoon next day,  hangover effect, she takes OTC NyQuil / Benadryl type medicines with some relief on sleep.  Still feels anxious, constantly on the move. She will be fidgety often. On SNRI Duloxetine with improvement. Still has anxiety and insomnia however  SLE / Lupus Followed by [facility] Rheumatology - Dr Oretha Ellis On medication management.  Syndrome of Inappropriate Tachycardia Currently managed on medication BB   Health Maintenance:  Upcoming mammogram scheduled 07/01/21  Colonoscopy done Dr Norma Fredrickson [facility] 05/2018, next due in 10 years, will request record.  Due Shingrix, Pneumovax-23 booster pre age 21 - will discuss and she can return when ready, can review w/ Rheum.  Depression screen [facility] 2/9 06/17/2021 03/24/2020 05/06/2017  Decreased Interest 2 3 0  Down, Depressed, Hopeless 2 2 0  PHQ - 2 Score 4 5 0  Altered sleeping 2 3 2   Tired, decreased energy 2 3 3   Change in appetite 3 2 1   Feeling bad or failure about yourself  1 2 0  Trouble concentrating 2 1 0  Moving slowly or fidgety/restless 0 0 1  Suicidal thoughts 0 0 0  PHQ-9 Score 14 16 7   Difficult doing work/chores Somewhat difficult Somewhat difficult -   GAD 7 : Generalized Anxiety Score 06/17/2021 03/24/2020 05/06/2017  Nervous, Anxious, on Edge 3 3 3   Control/stop worrying 3 3 1   Worry too much - different things 3 3 3   Trouble relaxing 3 3 3   Restless 1 1 1   Easily annoyed or irritable 3 3 3   Afraid - awful might happen 2 1 0  Total GAD 7 Score 18 17 14   Anxiety  Difficulty Somewhat difficult Somewhat difficult Somewhat difficult      Past Medical History:  Diagnosis Date   Allergy    Arthritis    Asthma    Clostridium difficile infection    2015x2 , 2016x1   Depression    Diastolic dysfunction    a. TTE 1/18: EF 50-55%, no RWMA, Gr1DD, mildly dilated aortic root of 3.4 cm, mildly dilated ascending aorta of 3.7 cm, mild mitral regurgitation, normal size left atrium, normal RV systolic function, PASP normal   Genital warts    GERD (gastroesophageal reflux disease)    Inappropriate sinus tachycardia    a. 2010 Nuc Stress test: no ischemia/infarct;  b. 10/2013 Echo: Nl LV fxn, mild MR/TR.   Migraine    Palpitations    Prolonged QT interval    SLE (systemic lupus erythematosus) (HCC)    Stomach ulcer    Past Surgical History:  Procedure Laterality Date   BREAST BIOPSY Right 03/01/2017   u/s core bx neg ADENOSIS AND FOCAL MICROCYST FORMATION.   BREAST CYST ASPIRATION Left 4-5 yrs ago   NEG   CATARACT EXTRACTION  2008   ENDOSCOPIC VEIN LASER TREATMENT     UMBILICAL HERNIA REPAIR     vulvar excision for HPV  07/04/2014   Social History   Socioeconomic History   Marital status: Married    Spouse name: Not on file  Number of children: Not on file   Years of education: Not on file   Highest education level: Not on file  Occupational History   Not on file  Tobacco Use   Smoking status: Never   Smokeless tobacco: Never  Substance and Sexual Activity   Alcohol use: Yes    Alcohol/week: 0.0 - 1.0 standard drinks   Drug use: No   Sexual activity: Yes    Partners: Male  Other Topics Concern   Not on file  Social History Narrative   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   Social Determinants of Health   Financial Resource Strain: Not on file  Food Insecurity: Not on file  Transportation Needs: Not on file  Physical Activity: Not on file  Stress: Not on file  Social Connections: Not on file  Intimate Partner Violence: Not on file   Family History   Problem Relation Age of Onset   Arthritis Mother    Bipolar disorder Mother    Depression Mother    Mental illness Mother    Hypertension Maternal Grandmother    Hyperlipidemia Maternal Grandfather    Heart disease Maternal Grandfather    Stroke Maternal Grandfather    Hypertension Maternal Grandfather    Colon cancer Paternal Grandfather    Breast cancer Neg Hx    Current Outpatient Medications on File Prior to Visit  Medication Sig   Belimumab 200 MG/ML SOAJ Inject 200 mg into the skin.   Biotin 5000 MCG TABS Take by mouth.   Cholecalciferol (VITAMIN D3) 3000 units TABS Take by mouth daily.   diclofenac sodium (VOLTAREN) 1 % GEL Apply 1 application topically as needed.   DULoxetine (CYMBALTA) 60 MG capsule Take 1 capsule (60 mg total) by mouth daily.   FLOVENT DISKUS 100 MCG/BLIST AEPB INHALE 1 PUFF INTO THE LUNGS TWICE A DAY   folic acid (FOLVITE) 1 MG tablet Take 1 mg by mouth daily.   hydroxychloroquine (PLAQUENIL) 200 MG tablet Take 200 mg by mouth 2 (two) times daily. Alternating 200 mg once a day and next day twice a day.   ipratropium (ATROVENT) 0.06 % nasal spray PLACE 2 SPRAYS INTO BOTH NOSTRILS 4 (FOUR) TIMES DAILY. FOR UP TO 5-7 DAYS THEN STOP.   ivabradine (CORLANOR) 5 MG TABS tablet Take 2 tablets (10 mg) by mouth x 1 dose 1-2 hours prior to your Cardiac CT   levalbuterol (XOPENEX HFA) 45 MCG/ACT inhaler Inhale 1 puff into the lungs every 4 (four) hours as needed for wheezing. Reported on 03/14/2016   magnesium oxide (MAG-OX) 400 MG tablet Take 1 tablet (400 mg total) by mouth daily.   methotrexate (RHEUMATREX) 2.5 MG tablet Take 2.5 mg SIX tablets every Monday.   metoprolol tartrate (LOPRESSOR) 25 MG tablet TAKE 1 TABLET BY MOUTH TWICE A DAY   Multiple Vitamins-Minerals (MULTIVITAMIN WITH MINERALS) tablet Take 1 tablet by mouth every morning.    mupirocin cream (BACTROBAN) 2 % Apply 1 application topically 2 (two) times daily.   naproxen sodium (ALEVE) 220 MG tablet  Take 1 tablet (220 mg total) by mouth at bedtime.   ondansetron (ZOFRAN) 4 MG tablet Take 1 tablet (4 mg total) by mouth every 8 (eight) hours as needed for nausea or vomiting.   pantoprazole (PROTONIX) 40 MG tablet TAKE 1 TABLET BY MOUTH EVERY DAY   Probiotic Product (PROBIOTIC DAILY PO) Take 1 tablet by mouth daily.    triamcinolone cream (KENALOG) 0.5 % Apply 1 application topically 2 (two) times daily. To affected areas,  for up to 2 weeks.   valACYclovir (VALTREX) 500 MG tablet Take 500 mg by mouth at bedtime.   verapamil (CALAN) 40 MG tablet Take one tablet every 8 hours as needed for Raynaud's   vitamin E 400 UNIT capsule Take 400 Units by mouth daily.   No current facility-administered medications on file prior to visit.    Review of Systems  Constitutional:  Negative for activity change, appetite change, chills, diaphoresis, fatigue and fever.  HENT:  Negative for congestion and hearing loss.   Eyes:  Negative for visual disturbance.  Respiratory:  Negative for cough, chest tightness, shortness of breath and wheezing.   Cardiovascular:  Negative for chest pain, palpitations and leg swelling.  Gastrointestinal:  Negative for abdominal pain, constipation, diarrhea, nausea and vomiting.  Genitourinary:  Negative for dysuria, frequency and hematuria.  Musculoskeletal:  Negative for arthralgias and neck pain.  Skin:  Negative for rash.  Neurological:  Negative for dizziness, weakness, light-headedness, numbness and headaches.  Hematological:  Negative for adenopathy.  Psychiatric/Behavioral:  Positive for sleep disturbance. Negative for behavioral problems and dysphoric mood. The patient is nervous/anxious.   Per HPI unless specifically indicated above      Objective:    BP 111/65   Pulse 77   Ht 5\' 4"  (1.626 m)   Wt 138 lb 12.8 oz (63 kg)   LMP 07/15/2015 (Within Days)   SpO2 100%   BMI 23.82 kg/m   Wt Readings from Last 3 Encounters:  06/17/21 138 lb 12.8 oz (63 kg)   01/01/21 129 lb (58.5 kg)  06/12/20 138 lb (62.6 kg)    Physical Exam Vitals and nursing note reviewed.  Constitutional:      General: She is not in acute distress.    Appearance: She is well-developed. She is not diaphoretic.     Comments: Well-appearing, comfortable, cooperative  HENT:     Head: Normocephalic and atraumatic.  Eyes:     General:        Right eye: No discharge.        Left eye: No discharge.     Conjunctiva/sclera: Conjunctivae normal.     Pupils: Pupils are equal, round, and reactive to light.  Neck:     Thyroid: No thyromegaly.  Cardiovascular:     Rate and Rhythm: Normal rate and regular rhythm.     Pulses: Normal pulses.     Heart sounds: Normal heart sounds. No murmur heard. Pulmonary:     Effort: Pulmonary effort is normal. No respiratory distress.     Breath sounds: Normal breath sounds. No wheezing or rales.  Abdominal:     General: Bowel sounds are normal. There is no distension.     Palpations: Abdomen is soft. There is no mass.     Tenderness: There is no abdominal tenderness.  Musculoskeletal:        General: No tenderness. Normal range of motion.     Cervical back: Normal range of motion and neck supple.     Right lower leg: No edema.     Left lower leg: No edema.     Comments: Upper / Lower Extremities: - Normal muscle tone, strength bilateral upper extremities 5/5, lower extremities 5/5  Lymphadenopathy:     Cervical: No cervical adenopathy.  Skin:    General: Skin is warm and dry.     Findings: No erythema or rash.  Neurological:     Mental Status: She is alert and oriented to person, place, and time.  Comments: Distal sensation intact to light touch all extremities  Psychiatric:        Mood and Affect: Mood normal.        Behavior: Behavior normal.        Thought Content: Thought content normal.     Comments: Well groomed, good eye contact, normal speech and thoughts     Results for orders placed or performed in visit on  04/28/20  HM PAP SMEAR  Result Value Ref Range   HM Pap smear NIL       Assessment & Plan:   Problem List Items Addressed This Visit     SLE (systemic lupus erythematosus) (HCC)   Insomnia   Relevant Medications   traZODone (DESYREL) 50 MG tablet   Inappropriate sinus tachycardia   GAD (generalized anxiety disorder)   Relevant Medications   traZODone (DESYREL) 50 MG tablet   busPIRone (BUSPAR) 5 MG tablet   Depression, major, recurrent, moderate (HCC)   Relevant Medications   traZODone (DESYREL) 50 MG tablet   busPIRone (BUSPAR) 5 MG tablet   Other Visit Diagnoses     Annual physical exam    -  Primary       Updated Health Maintenance information Prior blood done through Rheumatology, reviewed in CareEverywhere. No repeat lab done now. Consider add Lipid + A1c through Rheum when she returns in 08/2021 or we can check Encouraged improvement to lifestyle with diet and exercise Maintain healthy weight  SLE Managed by Rheumatology  Syndrome of Inappropriate Tachycardia Controlled on medication  #GAD Depressio, recurrent moderate Insomnia Chronic problems with mental health. Primarily is anxiety component On SNRI currently duloxetine Past failed Ambien Had mixed results on Buspar intermittent dosing Will trial Trazodone 50mg  nightly for insomnia, limit dosing w/ SNRI Increase Buspar to REGULAR dosing 5-10mg  BID TID to help control anxiety adjust accordingly Future reconsider option such as Dayvigo for insomnia if can get approved.   Meds ordered this encounter  Medications   traZODone (DESYREL) 50 MG tablet    Sig: Take 1 tablet (50 mg total) by mouth at bedtime.    Dispense:  30 tablet    Refill:  2   busPIRone (BUSPAR) 5 MG tablet    Sig: Take 1-2 tablets (5-10 mg total) by mouth 3 (three) times daily as needed (anxiety).    Dispense:  180 tablet    Refill:  1    Dx F41.1      Follow up plan: Return in about 2 months (around 08/18/2021) for 2 month  follow-up Anxiety, Insomnia, updates.  08/20/2021, DO [facility] 06/17/2021, 1:32 PM

## 2021-06-17 NOTE — Patient Instructions (Addendum)
Thank you for coming to the office today.  Recommend Pneumovax-23 - pneumonia vaccine booster (pre age 55) dose if interested, check with Rheumatology first.  Check with Rheumatology if they can add routine Lipid panel and possibly Hepatitis C antibody screening if you are interested.  Dayvigo check into this to see if potentially could get it covered / cost from insurance etc.   For now, trial on Trazodone. 50mg  nightly before bed 30 min, may take few days or weeks to take full effect.  Try buspar again 5-10mg  per dose 2-3 times a day or however it helps for DAILY anxiety regimen.   Please schedule a Follow-up Appointment to: Return in about 2 months (around 08/18/2021) for 2 month follow-up Anxiety, Insomnia, updates.  If you have any other questions or concerns, please feel free to call the office or send a message through MyChart. You may also schedule an earlier appointment if necessary.  Additionally, you may be receiving a survey about your experience at our office within a few days to 1 week by e-mail or mail. We value your feedback.  08/20/2021, DO [facility], [facility]

## 2021-06-19 ENCOUNTER — Other Ambulatory Visit (HOSPITAL_COMMUNITY): Payer: Self-pay | Admitting: Gastroenterology

## 2021-06-19 ENCOUNTER — Other Ambulatory Visit: Payer: Self-pay | Admitting: Gastroenterology

## 2021-06-19 DIAGNOSIS — R1011 Right upper quadrant pain: Secondary | ICD-10-CM

## 2021-06-19 DIAGNOSIS — R110 Nausea: Secondary | ICD-10-CM

## 2021-06-24 ENCOUNTER — Ambulatory Visit: Payer: No Typology Code available for payment source

## 2021-07-01 ENCOUNTER — Ambulatory Visit: Payer: No Typology Code available for payment source

## 2021-07-01 ENCOUNTER — Other Ambulatory Visit: Payer: Self-pay

## 2021-07-01 ENCOUNTER — Ambulatory Visit
Admission: RE | Admit: 2021-07-01 | Discharge: 2021-07-01 | Disposition: A | Discharge status: Home / Self Care | Payer: No Typology Code available for payment source | Source: Ambulatory Visit | Attending: Family Medicine | Admitting: Family Medicine

## 2021-07-01 DIAGNOSIS — Z1231 Encounter for screening mammogram for malignant neoplasm of breast: Secondary | ICD-10-CM | POA: Insufficient documentation

## 2021-07-07 ENCOUNTER — Other Ambulatory Visit: Payer: Self-pay | Admitting: Internal Medicine

## 2021-07-09 ENCOUNTER — Other Ambulatory Visit: Payer: Self-pay | Admitting: Family Medicine

## 2021-07-09 DIAGNOSIS — G4709 Other insomnia: Secondary | ICD-10-CM

## 2021-07-09 DIAGNOSIS — F411 Generalized anxiety disorder: Secondary | ICD-10-CM

## 2021-07-16 ENCOUNTER — Encounter: Payer: Self-pay | Admitting: Family Medicine

## 2021-07-16 ENCOUNTER — Ambulatory Visit: Payer: No Typology Code available for payment source | Admitting: Internal Medicine

## 2021-07-23 ENCOUNTER — Other Ambulatory Visit: Payer: Self-pay

## 2021-07-23 ENCOUNTER — Ambulatory Visit (INDEPENDENT_AMBULATORY_CARE_PROVIDER_SITE_OTHER): Payer: No Typology Code available for payment source | Admitting: Internal Medicine

## 2021-07-23 VITALS — BP 106/66 | HR 98 | Ht 64.0 in | Wt 139.8 lb

## 2021-07-23 DIAGNOSIS — R000 Tachycardia, unspecified: Secondary | ICD-10-CM

## 2021-07-23 DIAGNOSIS — R072 Precordial pain: Secondary | ICD-10-CM

## 2021-07-23 MED ORDER — METOPROLOL TARTRATE 25 MG PO TABS: ORAL_TABLET | ORAL | 3 refills | Status: DC

## 2021-07-23 NOTE — Patient Instructions (Signed)
Medication Instructions:  - Your physician recommends that you continue on your current medications as directed. Please refer to the Current Medication list given to you today.  *If you need a refill on your cardiac medications before your next appointment, please call your pharmacy*   Lab Work: - none ordered  If you have labs (blood work) drawn today and your tests are completely normal, you will receive your results only by: MyChart Message (if you have MyChart) OR A paper copy in the mail If you have any lab test that is abnormal or we need to change your treatment, we will call you to review the results.   Testing/Procedures: - none ordered   Follow-Up: At [facility], you and your health needs are our priority.  As part of our continuing mission to provide you with exceptional heart care, we have created designated Provider Care Teams.  These Care Teams include your primary Cardiologist (physician) and Advanced Practice Providers (APPs -  Physician Assistants and Nurse Practitioners) who all work together to provide you with the care you need, when you need it.  We recommend signing up for the patient portal called "MyChart".  Sign up information is provided on this After Visit Summary.  MyChart is used to connect with patients for Virtual Visits (Telemedicine).  Patients are able to view lab/test results, encounter notes, upcoming appointments, etc.  Non-urgent messages can be sent to your provider as well.   To learn more about what you can do with MyChart, go to [email or web address].    Your next appointment:   2 year(s)  The format for your next appointment:   In Person  Provider:   Steven Klein, MD   Other Instructions N/a 

## 2021-07-23 NOTE — Progress Notes (Signed)
Patient Care Team: Smitty Cords, DO as PCP - General (Family Medicine)   HPI  Christina Meza is a 55 y.o. 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 ( no data on aPL or anticardiolipin) and Raynauds HR in the 90s  mostly without problems of dyspnea, scant and infrequent edema, no chest pain    No family hx  LDL 2018 93   Almost off steroids for the first time since the 1990's , Raynauds managable on BB   DATE TEST EF    12/15 Echo  55%    1/18 Echo   50-55 % Ao root dilitation   9/21  CTA   Ca Score 0     DATE TEST    1/19 Holter Avg 101 ( nocturnal avg 90's)             Date Cr K TSH Hgb  1/19 0.82 4.4 1.626 13                 Records and Results Reviewed   Past Medical History:  Diagnosis Date   Allergy    Arthritis    Asthma    Clostridium difficile infection    2015x2 , 2016x1   Depression    Diastolic dysfunction    a. TTE 1/18: EF 50-55%, no RWMA, Gr1DD, mildly dilated aortic root of 3.4 cm, mildly dilated ascending aorta of 3.7 cm, mild mitral regurgitation, normal size left atrium, normal RV systolic function, PASP normal   Genital warts    GERD (gastroesophageal reflux disease)    Inappropriate sinus tachycardia    a. 2010 Nuc Stress test: no ischemia/infarct;  b. 10/2013 Echo: Nl LV fxn, mild MR/TR.   Migraine    Palpitations    Prolonged QT interval    SLE (systemic lupus erythematosus) (HCC)    Stomach ulcer     Past Surgical History:  Procedure Laterality Date   BREAST BIOPSY Right 03/01/2017   u/s core bx neg ADENOSIS AND FOCAL MICROCYST FORMATION.   BREAST CYST ASPIRATION Left 4-5 yrs ago   NEG   CATARACT EXTRACTION  2008   ENDOSCOPIC VEIN LASER TREATMENT     UMBILICAL HERNIA REPAIR     vulvar excision for HPV  07/04/2014    Current Meds  Medication Sig   Belimumab 200 MG/ML SOAJ Inject 200 mg into the skin.   Biotin 5000 MCG TABS Take by mouth.   busPIRone (BUSPAR) 5 MG tablet Take 1-2 tablets  (5-10 mg total) by mouth 3 (three) times daily as needed (anxiety).   Cholecalciferol (VITAMIN D3) 3000 units TABS Take by mouth daily.   diclofenac sodium (VOLTAREN) 1 % GEL Apply 1 application topically as needed.   DULoxetine (CYMBALTA) 60 MG capsule Take 1 capsule (60 mg total) by mouth daily.   FLOVENT DISKUS 100 MCG/BLIST AEPB INHALE 1 PUFF INTO THE LUNGS TWICE A DAY   folic acid (FOLVITE) 1 MG tablet Take 1 mg by mouth daily.   hydroxychloroquine (PLAQUENIL) 200 MG tablet Take 200 mg by mouth 2 (two) times daily. Alternating 200 mg once a day and next day twice a day.   methotrexate (RHEUMATREX) 2.5 MG tablet Take 2.5 mg SIX tablets every Monday.   mupirocin cream (BACTROBAN) 2 % Apply 1 application topically 2 (two) times daily.   naproxen sodium (ALEVE) 220 MG tablet Take 1 tablet (220 mg total) by mouth at bedtime.   ondansetron (ZOFRAN) 4 MG tablet Take 1 tablet (4  mg total) by mouth every 8 (eight) hours as needed for nausea or vomiting.   pantoprazole (PROTONIX) 40 MG tablet TAKE 1 TABLET BY MOUTH EVERY DAY   Probiotic Product (PROBIOTIC DAILY PO) Take 1 tablet by mouth daily.    traZODone (DESYREL) 50 MG tablet Take 1 tablet (50 mg total) by mouth at bedtime.   triamcinolone cream (KENALOG) 0.5 % Apply 1 application topically 2 (two) times daily. To affected areas, for up to 2 weeks.   valACYclovir (VALTREX) 500 MG tablet Take 500 mg by mouth at bedtime.   [DISCONTINUED] metoprolol tartrate (LOPRESSOR) 25 MG tablet KEEP APPOINTMENT FOR FUTURE REFILLS. TAKE 1 TABLET TWICE A DAY    Allergies  Allergen Reactions   Lansoprazole Swelling and Hives    Lips and tongue   Celebrex [Celecoxib] Other (See Comments) and Itching    Recommended not taking due to a stomach issue.    Sulfa Antibiotics Itching and Hives   Sulfamethoxazole-Trimethoprim Itching and Hives   Furosemide Itching and Hives      Review of Systems negative except from HPI and PMH  Physical Exam BP 106/66    Pulse 98   Ht 5\' 4"  (1.626 m)   Wt 139 lb 12.8 oz (63.4 kg)   LMP 07/15/2015 (Within Days)   SpO2 91%   BMI 24.00 kg/m  Well developed and well nourished in no acute distress HENT normal Neck supple    Clear Regular rate and rhythm, no murmur Abd-soft  No Clubbing cyanosis   edema Skin-warm and dry A & Oriented  Grossly normal sensory and motor function  ECG sinus @ 98 17/11/39 rsr'r'      CrCl cannot be calculated (Patient's most recent lab result is older than the maximum 21 days allowed.).   Assessment and  Plan  Inappropriate sinus tachycardia   Lupus  Exertional chest pain new onset   Raynaud's phenomenon  ECG fragmentation   Tachycardia remains much improved on metoprolol.  We will refill.  It is not aggravating her Raynaud's.  CT report last year was wonderfully encouraging with a calcium score of 0.  It is also thrilling for her that she is getting off of her steroids after all these years.  Fragmented ECG is more so than last year.  A brief literature review suggests that the presence of fragmentation in the absence of structural heart disease is of "no prognostic significance ," although I probably suspect it is some threshold.  The presence of structural disease, is been associated with more ventricular tachycardia.  Interestingly, nonischemic cardiomyopathies, there has been an inverse relationship between the degree of fragmentation and the degree of gadolinium enhancement.  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  We will arrange follow-up echocardiogram and then possibly cMRI based on its findings.          Current medicines are reviewed at length with the patient today .  The patient does not  have concerns regarding medicines.

## 2021-07-28 ENCOUNTER — Other Ambulatory Visit: Payer: Self-pay | Admitting: *Deleted

## 2021-07-28 DIAGNOSIS — I429 Cardiomyopathy, unspecified: Secondary | ICD-10-CM

## 2021-07-31 ENCOUNTER — Ambulatory Visit: Payer: No Typology Code available for payment source | Admitting: Registered Nurse

## 2021-07-31 ENCOUNTER — Encounter
Admission: RE | Disposition: A | Discharge status: Home / Self Care | Payer: Self-pay | Source: Home / Self Care | Attending: Internal Medicine

## 2021-07-31 ENCOUNTER — Ambulatory Visit
Admission: RE | Admit: 2021-07-31 | Discharge: 2021-07-31 | Disposition: A | Discharge status: Home / Self Care | Payer: No Typology Code available for payment source | Attending: Internal Medicine | Admitting: Internal Medicine

## 2021-07-31 ENCOUNTER — Encounter: Payer: Self-pay | Admitting: Internal Medicine

## 2021-07-31 DIAGNOSIS — Z881 Allergy status to other antibiotic agents status: Secondary | ICD-10-CM | POA: Diagnosis not present

## 2021-07-31 DIAGNOSIS — Z8719 Personal history of other diseases of the digestive system: Secondary | ICD-10-CM | POA: Diagnosis not present

## 2021-07-31 DIAGNOSIS — Z882 Allergy status to sulfonamides status: Secondary | ICD-10-CM | POA: Insufficient documentation

## 2021-07-31 DIAGNOSIS — Z79899 Other long term (current) drug therapy: Secondary | ICD-10-CM | POA: Insufficient documentation

## 2021-07-31 DIAGNOSIS — Z888 Allergy status to other drugs, medicaments and biological substances status: Secondary | ICD-10-CM | POA: Insufficient documentation

## 2021-07-31 DIAGNOSIS — K219 Gastro-esophageal reflux disease without esophagitis: Secondary | ICD-10-CM | POA: Insufficient documentation

## 2021-07-31 DIAGNOSIS — K2970 Gastritis, unspecified, without bleeding: Secondary | ICD-10-CM | POA: Insufficient documentation

## 2021-07-31 DIAGNOSIS — R6881 Early satiety: Secondary | ICD-10-CM | POA: Insufficient documentation

## 2021-07-31 DIAGNOSIS — Z7951 Long term (current) use of inhaled steroids: Secondary | ICD-10-CM | POA: Insufficient documentation

## 2021-07-31 DIAGNOSIS — R1013 Epigastric pain: Secondary | ICD-10-CM | POA: Insufficient documentation

## 2021-07-31 DIAGNOSIS — K449 Diaphragmatic hernia without obstruction or gangrene: Secondary | ICD-10-CM | POA: Insufficient documentation

## 2021-07-31 DIAGNOSIS — K3189 Other diseases of stomach and duodenum: Secondary | ICD-10-CM | POA: Insufficient documentation

## 2021-07-31 DIAGNOSIS — Z8619 Personal history of other infectious and parasitic diseases: Secondary | ICD-10-CM | POA: Insufficient documentation

## 2021-07-31 DIAGNOSIS — Z886 Allergy status to analgesic agent status: Secondary | ICD-10-CM | POA: Diagnosis not present

## 2021-07-31 DIAGNOSIS — R1011 Right upper quadrant pain: Secondary | ICD-10-CM | POA: Insufficient documentation

## 2021-07-31 HISTORY — PX: ESOPHAGOGASTRODUODENOSCOPY: SHX5428

## 2021-07-31 SURGERY — EGD (ESOPHAGOGASTRODUODENOSCOPY)
Anesthesia: General

## 2021-07-31 MED ORDER — SODIUM CHLORIDE 0.9 % IV SOLN
INTRAVENOUS | Status: DC
  Administered 2021-07-31: 1000 mL via INTRAVENOUS

## 2021-07-31 MED ORDER — LIDOCAINE 2% (20 MG/ML) 5 ML SYRINGE
INTRAMUSCULAR | Status: DC | PRN
  Administered 2021-07-31: 100 mg via INTRAVENOUS

## 2021-07-31 MED ORDER — PROPOFOL 10 MG/ML IV BOLUS
INTRAVENOUS | Status: DC | PRN
  Administered 2021-07-31: 70 mg via INTRAVENOUS
  Administered 2021-07-31: 30 mg via INTRAVENOUS

## 2021-07-31 MED ORDER — PROPOFOL 500 MG/50ML IV EMUL
INTRAVENOUS | Status: DC | PRN
  Administered 2021-07-31: 200 ug/kg/min via INTRAVENOUS

## 2021-07-31 NOTE — Transfer of Care (Signed)
Immediate Anesthesia Transfer of Care Note  Patient: Christina Meza  Procedure(s) Performed: ESOPHAGOGASTRODUODENOSCOPY (EGD)   Patient Location: Endoscopy Unit  Anesthesia Type:General  Level of Consciousness: awake, alert  and oriented  Airway & Oxygen Therapy: Patient Spontanous Breathing and Patient connected to nasal cannula oxygen  Post-op Assessment: Report given to RN and Post -op Vital signs reviewed and stable  Post vital signs: Reviewed and stable  Last Vitals:  Vitals Value Taken Time  BP    Temp    Pulse 88 07/31/21 1340  Resp 20 07/31/21 1340  SpO2 98 % 07/31/21 1340  Vitals shown include unvalidated device data.  Last Pain:  Vitals:   07/31/21 1255  TempSrc: Temporal  PainSc: 4       Patients Stated Pain Goal: 0 (07/31/21 1255)  Complications: No notable events documented.

## 2021-07-31 NOTE — Anesthesia Preprocedure Evaluation (Signed)
Anesthesia Evaluation  Patient identified by MRN, date of birth, ID band Patient awake    Reviewed: Allergy & Precautions, NPO status , Patient's Chart, lab work & pertinent test results, reviewed documented beta blocker date and time   Airway Mallampati: II  TM Distance: >3 FB Neck ROM: Full    Dental no notable dental hx.    Pulmonary neg pulmonary ROS, asthma ,    Pulmonary exam normal        Cardiovascular hypertension, Pt. on medications and Pt. on home beta blockers negative cardio ROS Normal cardiovascular exam+ Valvular Problems/Murmurs MR      Neuro/Psych  Headaches, PSYCHIATRIC DISORDERS Anxiety Depression negative neurological ROS  negative psych ROS   GI/Hepatic negative GI ROS, Neg liver ROS, PUD, GERD  Medicated,  Endo/Other  negative endocrine ROS  Renal/GU negative Renal ROS  negative genitourinary   Musculoskeletal negative musculoskeletal ROS (+) Arthritis ,   Abdominal   Peds negative pediatric ROS (+)  Hematology negative hematology ROS (+)   Anesthesia Other Findings Allergy    Arthritis    Asthma    Clostridium difficile infection  2015x2 , 2016x1  Depression    Diastolic dysfunction  a. TTE 1/18: EF 50-55%, no RWMA, Gr1DD, mildly dilated aortic root of 3.4 cm, mildly dilated ascending aorta of 3.7 cm, mild mitral regurgitation, normal size left atrium, normal RV systolic function, PASP normal     Reproductive/Obstetrics negative OB ROS                            Anesthesia Physical Anesthesia Plan  ASA: 2  Anesthesia Plan: General   Post-op Pain Management:    Induction: Intravenous  PONV Risk Score and Plan: 2 and Propofol infusion and TIVA  Airway Management Planned: Natural Airway and Nasal Cannula  Additional Equipment:   Intra-op Plan:   Post-operative Plan:   Informed Consent: I have reviewed the patients History and Physical, chart, labs and  discussed the procedure including the risks, benefits and alternatives for the proposed anesthesia with the patient or authorized representative who has indicated his/her understanding and acceptance.       Plan Discussed with: CRNA, Anesthesiologist and Surgeon  Anesthesia Plan Comments:         Anesthesia Quick Evaluation

## 2021-07-31 NOTE — H&P (Signed)
Outpatient short stay form Pre-procedure 07/31/2021 12:09 PM Tinita Brooker K. Norma Fredrickson, M.D.  Primary Physician: Saralyn Pilar, M.D.  Reason for visit:  Chronic RUQ pain, epigastric pain/dyspepsia, GERD, early satiety.  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 Recent labs obtained on 06/19/21 showed stable leukopenia (wbc 2.7) and macrocytic anemia (hgb 11). Pancreatic enzymes, electrolytes, renal function and hepatic function panel reviewed and were normal.  A RUQ ultrasound was scheduled for 06/24/21 but this was not executed by the patient as she underwent one last year that was deemed unremarkable. Patient mentions she has persistent bloating and belching with regurgitation and water brash. She is taking Pantoprazole 40mg  daily with moderate relief. She is having some c/o early satiety and is reporting only eating half her meals. She has no nausea or vomiting presently.  She had only one BM every 3 days and began taking Miralax 17g daily with some moderate relief of bloating and constipation. Colonoscopy in 2019 by me was reported as normal except for internal hemorrhoids.    No current facility-administered medications for this encounter.  Current Outpatient Medications:    Belimumab 200 MG/ML SOAJ, Inject 200 mg into the skin., Disp: , Rfl:    Biotin 5000 MCG TABS, Take by mouth., Disp: , Rfl:    busPIRone (BUSPAR) 5 MG tablet, Take 1-2 tablets (5-10 mg total) by mouth 3 (three) times daily as needed (anxiety)., Disp: 180 tablet, Rfl: 1   Cholecalciferol (VITAMIN D3) 3000 units TABS, Take by mouth daily., Disp: , Rfl:    diclofenac sodium (VOLTAREN) 1 % GEL, Apply 1 application topically as needed., Disp: , Rfl:    DULoxetine (CYMBALTA) 60 MG capsule, Take 1 capsule (60 mg total) by mouth daily., Disp: 90 capsule, Rfl: 3   FLOVENT DISKUS 100 MCG/BLIST AEPB, INHALE 1 PUFF INTO THE LUNGS TWICE A DAY, Disp: 180 each,  Rfl: 1   folic acid (FOLVITE) 1 MG tablet, Take 1 mg by mouth daily., Disp: , Rfl:    hydroxychloroquine (PLAQUENIL) 200 MG tablet, Take 200 mg by mouth 2 (two) times daily. Alternating 200 mg once a day and next day twice a day., Disp: , Rfl:    ipratropium (ATROVENT) 0.06 % nasal spray, PLACE 2 SPRAYS INTO BOTH NOSTRILS 4 (FOUR) TIMES DAILY. FOR UP TO 5-7 DAYS THEN STOP. (Patient not taking: Reported on 07/23/2021), Disp: 45 mL, Rfl: 1   ivabradine (CORLANOR) 5 MG TABS tablet, Take 2 tablets (10 mg) by mouth x 1 dose 1-2 hours prior to your Cardiac CT (Patient not taking: Reported on 07/23/2021), Disp: 2 tablet, Rfl: 0   levalbuterol (XOPENEX HFA) 45 MCG/ACT inhaler, Inhale 1 puff into the lungs every 4 (four) hours as needed for wheezing. Reported on 03/14/2016 (Patient not taking: Reported on 07/23/2021), Disp: , Rfl:    magnesium oxide (MAG-OX) 400 MG tablet, Take 1 tablet (400 mg total) by mouth daily. (Patient not taking: Reported on 07/23/2021), Disp: 90 tablet, Rfl: 3   methotrexate (RHEUMATREX) 2.5 MG tablet, Take 2.5 mg SIX tablets every Monday., Disp: , Rfl:    metoprolol tartrate (LOPRESSOR) 25 MG tablet, Take 1 tablet (25 mg) by mouth twice daily, Disp: 180 tablet, Rfl: 3   Multiple Vitamins-Minerals (MULTIVITAMIN WITH MINERALS) tablet, Take 1 tablet by mouth every morning.  (Patient not taking: Reported on 07/23/2021), Disp: , Rfl:    mupirocin cream (BACTROBAN) 2 %, Apply 1 application topically 2 (two) times  daily., Disp: 15 g, Rfl: 0   naproxen sodium (ALEVE) 220 MG tablet, Take 1 tablet (220 mg total) by mouth at bedtime., Disp: , Rfl:    ondansetron (ZOFRAN) 4 MG tablet, Take 1 tablet (4 mg total) by mouth every 8 (eight) hours as needed for nausea or vomiting., Disp: 30 tablet, Rfl: 3   pantoprazole (PROTONIX) 40 MG tablet, TAKE 1 TABLET BY MOUTH EVERY DAY, Disp: 90 tablet, Rfl: 3   Probiotic Product (PROBIOTIC DAILY PO), Take 1 tablet by mouth daily. , Disp: , Rfl:    traZODone  (DESYREL) 50 MG tablet, Take 1 tablet (50 mg total) by mouth at bedtime., Disp: 30 tablet, Rfl: 2   triamcinolone cream (KENALOG) 0.5 %, Apply 1 application topically 2 (two) times daily. To affected areas, for up to 2 weeks., Disp: 30 g, Rfl: 1   valACYclovir (VALTREX) 500 MG tablet, Take 500 mg by mouth at bedtime., Disp: , Rfl:    verapamil (CALAN) 40 MG tablet, Take one tablet every 8 hours as needed for Raynaud's (Patient not taking: Reported on 07/23/2021), Disp: 90 tablet, Rfl: 3   vitamin E 400 UNIT capsule, Take 400 Units by mouth daily. (Patient not taking: Reported on 07/23/2021), Disp: , Rfl:   No medications prior to admission.     Allergies  Allergen Reactions   Lansoprazole Swelling and Hives    Lips and tongue   Celebrex [Celecoxib] Other (See Comments) and Itching    Recommended not taking due to a stomach issue.    Sulfa Antibiotics Itching and Hives   Sulfamethoxazole-Trimethoprim Itching and Hives   Furosemide Itching and Hives     Past Medical History:  Diagnosis Date   Allergy    Arthritis    Asthma    Clostridium difficile infection    2015x2 , 2016x1   Depression    Diastolic dysfunction    a. TTE 1/18: EF 50-55%, no RWMA, Gr1DD, mildly dilated aortic root of 3.4 cm, mildly dilated ascending aorta of 3.7 cm, mild mitral regurgitation, normal size left atrium, normal RV systolic function, PASP normal   Genital warts    GERD (gastroesophageal reflux disease)    Inappropriate sinus tachycardia    a. 2010 Nuc Stress test: no ischemia/infarct;  b. 10/2013 Echo: Nl LV fxn, mild MR/TR.   Migraine    Palpitations    Prolonged QT interval    SLE (systemic lupus erythematosus) (HCC)    Stomach ulcer     Review of systems:  Otherwise negative.    Physical Exam  Gen: Alert, oriented. Appears stated age.  HEENT: [place]/AT. PERRLA. Lungs: CTA, no wheezes. CV: RR nl S1, S2. Abd: soft, benign, no masses. BS+ Ext: No edema. Pulses 2+    Planned procedures:  Proceed with EGD. The patient understands the nature of the planned procedure, indications, risks, alternatives and potential complications including but not limited to bleeding, infection, perforation, damage to internal organs and possible oversedation/side effects from anesthesia. The patient agrees and gives consent to proceed.  Please refer to procedure notes for findings, recommendations and patient disposition/instructions.     Armany Mano K. Norma Fredrickson, M.D. Gastroenterology 07/31/2021  12:09 PM

## 2021-07-31 NOTE — Anesthesia Postprocedure Evaluation (Signed)
Anesthesia Post Note  Patient: Chiropodist  Procedure(s) Performed: ESOPHAGOGASTRODUODENOSCOPY (EGD)  Patient location during evaluation: Phase II Anesthesia Type: General Level of consciousness: awake and alert, awake and oriented Pain management: pain level controlled Vital Signs Assessment: post-procedure vital signs reviewed and stable Respiratory status: spontaneous breathing, nonlabored ventilation and respiratory function stable Cardiovascular status: blood pressure returned to baseline and stable Postop Assessment: no apparent nausea or vomiting Anesthetic complications: no   No notable events documented.   Last Vitals:  Vitals:   07/31/21 1350 07/31/21 1400  BP: 122/78 129/84  Pulse: 82 83  Resp: 12 15  Temp:    SpO2: 95% 96%    Last Pain:  Vitals:   07/31/21 1400  TempSrc:   PainSc: 0-No pain                 Manfred Arch

## 2021-07-31 NOTE — Interval H&P Note (Signed)
History and Physical Interval Note:  07/31/2021 1:12 PM  Ginger Carne  has presented today for surgery, with the diagnosis of GASTRO-ESOPHAGEAL REFLUX DISEASE WITHOUT ESOPHAGITIS EARLY SATIETY.  The various methods of treatment have been discussed with the patient and family. After consideration of risks, benefits and other options for treatment, the patient has consented to  Procedure(s): ESOPHAGOGASTRODUODENOSCOPY (EGD) (N/A) as a surgical intervention.  The patient's history has been reviewed, patient examined, no change in status, stable for surgery.  I have reviewed the patient's chart and labs.  Questions were answered to the patient's satisfaction.     [place], [place]

## 2021-07-31 NOTE — Op Note (Signed)
[facility] Gastroenterology Patient Name: Christina Meza Procedure Date: 07/31/2021 1:13 PM MRN: [id number] Account #: [id number] Date of Birth: 03/03/1966 Admit Type: Outpatient Age: 55 Room: [facility] ENDO ROOM 3 Gender: Female Note Status: Finalized Procedure:             Upper GI endoscopy Indications:           Epigastric abdominal pain, Abdominal pain in the right                         upper quadrant, Dyspepsia, Early satiety Providers:             Boykin Nearing. Norma Fredrickson MD, MD Referring MD:          Smitty Cords (Referring MD) Complications:         No immediate complications. Procedure:             Pre-Anesthesia Assessment:                        - The risks and benefits of the procedure and the                         sedation options and risks were discussed with the                         patient. All questions were answered and informed                         consent was obtained.                        - Patient identification and proposed procedure were                         verified prior to the procedure by the nurse. The                         procedure was verified in the procedure room.                        - ASA Grade Assessment: III - A patient with severe                         systemic disease.                        - After reviewing the risks and benefits, the patient                         was deemed in satisfactory condition to undergo the                         procedure.                        After obtaining informed consent, the endoscope was                         passed under direct vision. Throughout the procedure,  the patient's blood pressure, pulse, and oxygen                         saturations were monitored continuously. The Endoscope                         was introduced through the mouth, and advanced to the                         third part of duodenum. The upper GI endoscopy  was                         accomplished without difficulty. The patient tolerated                         the procedure well. Findings:      The esophagus was normal.      Localized minimal inflammation characterized by erythema was found in       the gastric antrum. One biopsy was obtained with cold forceps for       Helicobacter pylori testing in the gastric antrum, as well as one biopsy       in the gastric body.      A 1 cm hiatal hernia was present.      The examined duodenum was normal.      The exam was otherwise without abnormality. Impression:            - Normal esophagus.                        - Gastritis.                        - 1 cm hiatal hernia.                        - Normal examined duodenum.                        - The examination was otherwise normal.                        - Biopsies performed in the gastric antrum and in the                         gastric body. Recommendation:        - Patient has a contact number available for                         emergencies. The signs and symptoms of potential                         delayed complications were discussed with the patient.                         Return to normal activities tomorrow. Written                         discharge instructions were provided to the patient.                        -  Resume previous diet.                        - Continue present medications.                        - Await pathology results.                        - Return to my office in 2 months.                        - The findings and recommendations were discussed with                         the patient. Procedure Code(s):     --- Professional ---                        [phone], Esophagogastroduodenoscopy, flexible,                         transoral; with biopsy, single or multiple Diagnosis Code(s):     --- Professional ---                        R68.81, Early satiety                        R10.11, Right upper quadrant  pain                        R10.13, Epigastric pain                        K44.9, Diaphragmatic hernia without obstruction or                         gangrene                        K29.70, Gastritis, unspecified, without bleeding CPT copyright 2019 [employer]. All rights reserved. The codes documented in this report are preliminary and upon coder review may  be revised to meet current compliance requirements. Stanton Kidney MD, MD 07/31/2021 1:37:50 PM This report has been signed electronically. Number of Addenda: 0 Note Initiated On: 07/31/2021 1:13 PM Estimated Blood Loss:  Estimated blood loss: none.      [facility]

## 2021-08-02 ENCOUNTER — Other Ambulatory Visit: Payer: Self-pay | Admitting: Internal Medicine

## 2021-08-03 ENCOUNTER — Encounter: Payer: Self-pay | Admitting: Internal Medicine

## 2021-08-03 LAB — SURGICAL PATHOLOGY

## 2021-08-05 ENCOUNTER — Other Ambulatory Visit: Payer: Self-pay | Admitting: Family Medicine

## 2021-08-05 DIAGNOSIS — F411 Generalized anxiety disorder: Secondary | ICD-10-CM

## 2021-08-05 NOTE — Telephone Encounter (Signed)
Valid encounter. Future visit in 3 weeks  

## 2021-08-08 ENCOUNTER — Other Ambulatory Visit: Payer: Self-pay | Admitting: Family Medicine

## 2021-08-08 DIAGNOSIS — G4709 Other insomnia: Secondary | ICD-10-CM

## 2021-08-08 NOTE — Telephone Encounter (Signed)
Requested medications are due for refill today NO  Requested medications are on the active medication list yes  Last refill 08/06/21  Last visit 06/17/21  Future visit scheduled 09/01/21  Notes to clinic requesting too soon, please assess.

## 2021-08-11 ENCOUNTER — Encounter: Payer: Self-pay | Admitting: Family Medicine

## 2021-09-01 ENCOUNTER — Ambulatory Visit: Payer: No Typology Code available for payment source | Admitting: Family Medicine

## 2021-09-02 ENCOUNTER — Other Ambulatory Visit: Payer: Self-pay | Admitting: Family Medicine

## 2021-09-02 DIAGNOSIS — F411 Generalized anxiety disorder: Secondary | ICD-10-CM

## 2021-09-02 NOTE — Telephone Encounter (Signed)
Requested medication (s) are due for refill today.  Last ordered 08/05/21 #180 tabs with one refill.   Requested medication (s) are on the active medication list Yes  Future visit scheduled No. LOV July 2022  Note to clinic-Pharmacy requesting diagnosis code along with the refill request.    Requested Prescriptions  Pending Prescriptions Disp Refills   busPIRone (BUSPAR) 5 MG tablet [Pharmacy Med Name: BUSPIRONE HCL 5 MG TABLET] 540 tablet 1    Sig: TAKE 1-2 TABLETS (5-10 MG TOTAL) BY MOUTH 3 (THREE) TIMES DAILY AS NEEDED (ANXIETY).     Psychiatry: Anxiolytics/Hypnotics - Non-controlled Passed - 09/02/2021 10:31 AM      Passed - Valid encounter within last 6 months    Recent Outpatient Visits           2 months ago Annual physical exam   [facility] Smitty Cords, DO   1 year ago Viral gastroenteritis   [facility] [place], Netta Neat, DO   1 year ago Depression, major, recurrent, moderate [facility])   [facility] Smitty Cords, DO   2 years ago Acute non-recurrent maxillary sinusitis   [facility] [place], Netta Neat, DO   3 years ago Bilateral hand swelling   [facility] [place], Netta Neat, [place]

## 2021-09-08 ENCOUNTER — Other Ambulatory Visit: Payer: Self-pay

## 2021-09-08 ENCOUNTER — Ambulatory Visit (INDEPENDENT_AMBULATORY_CARE_PROVIDER_SITE_OTHER): Payer: No Typology Code available for payment source

## 2021-09-08 DIAGNOSIS — I429 Cardiomyopathy, unspecified: Secondary | ICD-10-CM

## 2021-09-08 DIAGNOSIS — I428 Other cardiomyopathies: Secondary | ICD-10-CM | POA: Diagnosis not present

## 2021-09-08 LAB — ECHOCARDIOGRAM COMPLETE
AR max vel: 2.51 cm2
AV Area VTI: 2.57 cm2
AV Area mean vel: 2.25 cm2
AV Mean grad: 3 mmHg
AV Peak grad: 4 mmHg
AV Vena cont: 0.2 cm
Ao pk vel: 1 m/s
Area-P 1/2: 2.86 cm2
Calc EF: 52.8 %
P 1/2 time: 592 msec
S' Lateral: 3.3 cm
Single Plane A2C EF: 48.5 %
Single Plane A4C EF: 53.2 %

## 2021-09-11 ENCOUNTER — Encounter: Payer: Self-pay | Admitting: Family Medicine

## 2021-09-12 ENCOUNTER — Other Ambulatory Visit: Payer: Self-pay

## 2021-09-12 ENCOUNTER — Encounter: Payer: Self-pay | Admitting: Emergency Medicine

## 2021-09-12 ENCOUNTER — Ambulatory Visit
Admission: EM | Admit: 2021-09-12 | Discharge: 2021-09-12 | Disposition: A | Discharge status: Home / Self Care | Payer: No Typology Code available for payment source | Attending: Emergency Medicine | Admitting: Emergency Medicine

## 2021-09-12 DIAGNOSIS — B349 Viral infection, unspecified: Secondary | ICD-10-CM

## 2021-09-12 NOTE — Discharge Instructions (Addendum)
Your COVID and flu tests are pending.  You should self quarantine until the test results are back.  Continue to take symptomatic treatment including ibuprofen and Mucinex.  Follow up with your primary care provider if your symptoms are not improving.

## 2021-09-12 NOTE — ED Triage Notes (Signed)
Pt here with URI sx including severe headache and cough x 3 days.

## 2021-09-12 NOTE — ED Provider Notes (Signed)
[facility]    CSN: [id number] Arrival date & time: 09/12/21  1254      History   Chief Complaint Chief Complaint  Patient presents with   Cough   Nasal Congestion   Sore Throat   Headache   Generalized Body Aches     HPI Hubert Derstine is a 55 y.o. female.  Patient presents with 3-day history of fever, ear pain, sore throat, cough, headache.  T-max 101.  Treatment at home with OTC cold medication.  She denies shortness of breath, vomiting, diarrhea, or other symptoms.  Her medical history includes prolonged QT interval, diastolic dysfunction, lupus, asthma, GERD, depression, insomnia, arthritis.  The history is provided by the patient and medical records.   Past Medical History:  Diagnosis Date   Allergy    Arthritis    Asthma    Clostridium difficile infection    2015x2 , 2016x1   Depression    Diastolic dysfunction    a. TTE 1/18: EF 50-55%, no RWMA, Gr1DD, mildly dilated aortic root of 3.4 cm, mildly dilated ascending aorta of 3.7 cm, mild mitral regurgitation, normal size left atrium, normal RV systolic function, PASP normal   Genital warts    GERD (gastroesophageal reflux disease)    Inappropriate sinus tachycardia    a. 2010 Nuc Stress test: no ischemia/infarct;  b. 10/2013 Echo: Nl LV fxn, mild MR/TR.   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   Neck pain 06/18/2016   Bilateral wrist pain 05/24/2016   Long term current use of systemic steroids 12/16/2015   Raynaud's disease without gangrene 12/16/2015   Rheumatoid factor positive 12/16/2015    Inappropriate sinus tachycardia 11/17/2015   Sinus tachycardia 11/12/2015   SLE (systemic lupus erythematosus) (HCC) 11/11/2015   Preventative health care 11/11/2015   Depression, major, recurrent, moderate (HCC) 11/11/2015   Moderate persistent asthma 11/11/2015   GERD (gastroesophageal reflux disease) 11/11/2015   Insomnia 11/11/2015    Past Surgical History:  Procedure Laterality Date   BREAST BIOPSY Right 03/01/2017   u/s core bx neg ADENOSIS AND FOCAL MICROCYST FORMATION.   BREAST CYST ASPIRATION Left 4-5 yrs ago   NEG   CATARACT EXTRACTION  2008   ENDOSCOPIC VEIN LASER TREATMENT     ESOPHAGOGASTRODUODENOSCOPY N/A 07/31/2021   Procedure: ESOPHAGOGASTRODUODENOSCOPY (EGD);  Surgeon: Toledo, Boykin Nearing, MD;  Location: [facility] ENDOSCOPY;  Service: Gastroenterology;  Laterality: N/A;   UMBILICAL HERNIA REPAIR     vulvar excision for HPV  07/04/2014    OB History   No obstetric history on file.      Home Medications    Prior to Admission medications   Medication Sig Start Date End Date Taking? Authorizing Provider  Belimumab 200 MG/ML SOAJ Inject 200 mg into the skin. 01/25/17   [provider]  Biotin 5000 MCG TABS Take by mouth.    [provider]  busPIRone (BUSPAR) 5 MG tablet TAKE 1-2 TABLETS (5-10 MG TOTAL) BY MOUTH 3 (THREE) TIMES DAILY AS NEEDED (ANXIETY). 09/04/21   Lorre Munroe, NP  Cholecalciferol (VITAMIN  D3) 3000 units TABS Take by mouth daily.    [provider]  diclofenac sodium (VOLTAREN) 1 % GEL Apply 1 application topically as needed.    [provider]  DULoxetine (CYMBALTA) 60 MG capsule Take 1 capsule (60 mg total) by mouth daily. 12/24/20   Karamalegos, Netta Neat, DO  FLOVENT DISKUS 100 MCG/BLIST AEPB INHALE 1 PUFF INTO THE LUNGS TWICE A DAY 08/19/19   Karamalegos, Netta Neat, DO  folic acid (FOLVITE) 1 MG tablet Take 1 mg by mouth daily. 03/10/20   [provider]  hydroxychloroquine (PLAQUENIL) 200 MG tablet Take  200 mg by mouth 2 (two) times daily. Alternating 200 mg once a day and next day twice a day.    [provider]  ipratropium (ATROVENT) 0.06 % nasal spray PLACE 2 SPRAYS INTO BOTH NOSTRILS 4 (FOUR) TIMES DAILY. FOR UP TO 5-7 DAYS THEN STOP. 10/15/19   Karamalegos, Netta Neat, DO  ivabradine (CORLANOR) 5 MG TABS tablet Take 2 tablets (10 mg) by mouth x 1 dose 1-2 hours prior to your Cardiac CT Patient not taking: Reported on 07/23/2021 06/12/20   Duke Salvia, MD  levalbuterol [facility] HFA) 45 MCG/ACT inhaler Inhale 1 puff into the lungs every 4 (four) hours as needed for wheezing. Reported on 03/14/2016 Patient not taking: Reported on 07/23/2021    [provider]  magnesium oxide (MAG-OX) 400 MG tablet Take 1 tablet (400 mg total) by mouth daily. Patient not taking: Reported on 07/23/2021 12/14/17   Sondra Barges, PA-C  methotrexate (RHEUMATREX) 2.5 MG tablet Take 2.5 mg SIX tablets every Monday. 12/30/17   [provider]  metoprolol tartrate (LOPRESSOR) 25 MG tablet Take 1 tablet (25 mg) by mouth twice daily 07/23/21   Duke Salvia, MD  Multiple Vitamins-Minerals (MULTIVITAMIN WITH MINERALS) tablet Take 1 tablet by mouth every morning.    [provider]  mupirocin cream (BACTROBAN) 2 % Apply 1 application topically 2 (two) times daily. 01/01/21   Dahlia Byes A, NP  naproxen sodium (ALEVE) 220 MG tablet Take 1 tablet (220 mg total) by mouth at bedtime. 08/10/16   Karamalegos, Netta Neat, DO  ondansetron (ZOFRAN) 4 MG tablet Take 1 tablet (4 mg total) by mouth every 8 (eight) hours as needed for nausea or vomiting. 05/21/16   Tommie Sams, DO  pantoprazole (PROTONIX) 40 MG tablet TAKE 1 TABLET BY MOUTH EVERY DAY 03/23/21   Althea Charon, Netta Neat, DO  Probiotic Product (PROBIOTIC DAILY PO) Take 1 tablet by mouth daily.     [provider]  traZODone (DESYREL) 50 MG tablet TAKE 1 TABLET BY MOUTH EVERYDAY AT BEDTIME 08/11/21   Karamalegos, Netta Neat, DO   triamcinolone cream (KENALOG) 0.5 % Apply 1 application topically 2 (two) times daily. To affected areas, for up to 2 weeks. 03/16/17   Karamalegos, Netta Neat, DO  valACYclovir (VALTREX) 500 MG tablet Take 500 mg by mouth at bedtime.    [provider]  verapamil (CALAN) 40 MG tablet Take one tablet every 8 hours as needed for Raynaud's Patient not taking: Reported on 07/23/2021 04/24/18   Duke Salvia, MD  vitamin E 400 UNIT capsule Take 400 Units by mouth daily.    [provider]    Family History Family History  Problem Relation Age of Onset   Arthritis Mother    Bipolar disorder Mother    Depression Mother    Mental illness Mother    Hypertension Maternal Grandmother  Hyperlipidemia Maternal Grandfather    Heart disease Maternal Grandfather    Stroke Maternal Grandfather    Hypertension Maternal Grandfather    Colon cancer Paternal Grandfather    Breast cancer Neg Hx     Social History Social History   Tobacco Use   Smoking status: Never   Smokeless tobacco: Never  Substance Use Topics   Alcohol use: Yes    Alcohol/week: 0.0 - 1.0 standard drinks   Drug use: No     Allergies   Lansoprazole, Celebrex [celecoxib], Sulfa antibiotics, Sulfamethoxazole-trimethoprim, and Furosemide   Review of Systems Review of Systems  Constitutional:  Positive for fever. Negative for chills.  HENT:  Positive for congestion, ear pain and sore throat.   Respiratory:  Positive for cough. Negative for shortness of breath.   Cardiovascular:  Negative for chest pain and palpitations.  Gastrointestinal:  Negative for abdominal pain, diarrhea and vomiting.  Skin:  Negative for color change and rash.  All other systems reviewed and are negative.   Physical Exam Triage Vital Signs ED Triage Vitals  Enc Vitals Group     BP      Pulse      Resp      Temp      Temp src      SpO2      Weight      Height      Head Circumference      Peak Flow      Pain Score       Pain Loc      Pain Edu?      Excl. in GC?    No data found.  Updated Vital Signs BP 100/67 (BP Location: Left Arm)   Pulse (!) 107   Temp 99.6 F (37.6 C) (Oral)   Resp 18   LMP 07/15/2015 (Within Days)   SpO2 98%   Visual Acuity Right Eye Distance:   Left Eye Distance:   Bilateral Distance:    Right Eye Near:   Left Eye Near:    Bilateral Near:     Physical Exam Vitals and nursing note reviewed.  Constitutional:      General: She is not in acute distress.    Appearance: She is well-developed.  HENT:     Head: Normocephalic and atraumatic.     Right Ear: Tympanic membrane normal.     Left Ear: Tympanic membrane normal.     Nose: Rhinorrhea present.     Mouth/Throat:     Mouth: Mucous membranes are moist.     Pharynx: Oropharynx is clear.  Eyes:     Conjunctiva/sclera: Conjunctivae normal.  Cardiovascular:     Rate and Rhythm: Normal rate and regular rhythm.     Heart sounds: Normal heart sounds.  Pulmonary:     Effort: Pulmonary effort is normal. No respiratory distress.     Breath sounds: Normal breath sounds.  Abdominal:     Palpations: Abdomen is soft.     Tenderness: There is no abdominal tenderness. There is no guarding or rebound.  Musculoskeletal:     Cervical back: Neck supple.  Skin:    General: Skin is warm and dry.  Neurological:     General: No focal deficit present.     Mental Status: She is alert and oriented to person, place, and time.     Gait: Gait normal.  Psychiatric:        Mood and Affect: Mood normal.  Behavior: Behavior normal.     UC Treatments / Results  Labs (all labs ordered are listed, but only abnormal results are displayed) Labs Reviewed  COVID-19, FLU A+B NAA    EKG   Radiology No results found.  Procedures Procedures (including critical care time)  Medications Ordered in UC Medications - No data to display  Initial Impression / Assessment and Plan / UC Course  I have reviewed the triage vital  signs and the nursing notes.  Pertinent labs & imaging results that were available during my care of the patient were reviewed by me and considered in my medical decision making (see chart for details).   Viral illness.  COVID and Flu pending.  Instructed patient to self quarantine per CDC guidelines.  Discussed symptomatic treatment including Tylenol or ibuprofen, rest, hydration.  Instructed patient to follow up with PCP if symptoms are not improving.  Patient agrees to plan of care.    Final Clinical Impressions(s) / UC Diagnoses   Final diagnoses:  Viral illness     Discharge Instructions      Your COVID and flu tests are pending.  You should self quarantine until the test results are back.  Continue to take symptomatic treatment including ibuprofen and Mucinex.  Follow up with your primary care provider if your symptoms are not improving.        ED Prescriptions   None    PDMP not reviewed this encounter.   Mickie Bail, NP 09/12/21 1350

## 2021-09-13 LAB — COVID-19, FLU A+B NAA
Influenza A, NAA: NOT DETECTED
Influenza B, NAA: NOT DETECTED
SARS-CoV-2, NAA: NOT DETECTED

## 2021-09-14 ENCOUNTER — Ambulatory Visit (INDEPENDENT_AMBULATORY_CARE_PROVIDER_SITE_OTHER): Payer: No Typology Code available for payment source | Admitting: Family Medicine

## 2021-09-14 ENCOUNTER — Other Ambulatory Visit: Payer: Self-pay

## 2021-09-14 ENCOUNTER — Encounter: Payer: Self-pay | Admitting: Family Medicine

## 2021-09-14 VITALS — BP 112/61 | HR 93 | Ht 64.0 in | Wt 135.0 lb

## 2021-09-14 DIAGNOSIS — J4541 Moderate persistent asthma with (acute) exacerbation: Secondary | ICD-10-CM | POA: Diagnosis not present

## 2021-09-14 DIAGNOSIS — J0110 Acute frontal sinusitis, unspecified: Secondary | ICD-10-CM | POA: Diagnosis not present

## 2021-09-14 MED ORDER — IPRATROPIUM BROMIDE 0.06 % NA SOLN: 2.0000 | Freq: Four times a day (QID) | NASAL | 0 refills | Status: DC

## 2021-09-14 MED ORDER — PREDNISONE 50 MG PO TABS: 50.0000 mg | ORAL_TABLET | Freq: Every day | ORAL | 0 refills | Status: DC

## 2021-09-14 MED ORDER — BENZONATATE 100 MG PO CAPS: 100.0000 mg | ORAL_CAPSULE | Freq: Three times a day (TID) | ORAL | 0 refills | Status: DC | PRN

## 2021-09-14 MED ORDER — AMOXICILLIN-POT CLAVULANATE 875-125 MG PO TABS: 1.0000 | ORAL_TABLET | Freq: Two times a day (BID) | ORAL | 0 refills | Status: DC

## 2021-09-14 NOTE — Patient Instructions (Addendum)
Thank you for coming to the office today.  Start nasal steroid Flonase 2 sprays in each nostril daily for 4-6 weeks, may repeat course seasonally or as needed  Augmentin 7 days  Start Tessalon Perls take 1 capsule up to 3 times a day as needed for cough  Start Atrovent nasal spray decongestant 2 sprays in each nostril up to 4 times daily for 7 days   Please schedule a Follow-up Appointment to: Return if symptoms worsen or fail to improve.  If you have any other questions or concerns, please feel free to call the office or send a message through MyChart. You may also schedule an earlier appointment if necessary.  Additionally, you may be receiving a survey about your experience at our office within a few days to 1 week by e-mail or mail. We value your feedback.  Saralyn Pilar, DO [facility], [place]

## 2021-09-14 NOTE — Progress Notes (Signed)
Subjective:    Patient ID: Christina Meza, female    DOB: 1966-11-20, 55 y.o.   MRN: [id number]  Christina Meza is a 55 y.o. female presenting on 09/14/2021 for Headache, Sore Throat, and Ear Pain   HPI  Acute Sinusitis Sick contact mother had similar symptoms on Thursday last week but she has improved Some people at cold symptoms at work She has had persistent symptoms not improved. She has had negative COVID and Flu Test on Saturday. Admits persistent sinus pain and pressure R ear worse. No hearing loss or drainage out of ear. Sore throat with post nasal drainage. History Asthma Moderate persistent asthma on Flovent it irritates her throat no rescue inhaler Miss work due to illness.   Health Maintenance: Defer Vaccines today due to current acute illness  Depression screen [facility] 2/9 06/17/2021 03/24/2020 05/06/2017  Decreased Interest 2 3 0  Down, Depressed, Hopeless 2 2 0  PHQ - 2 Score 4 5 0  Altered sleeping 2 3 2   Tired, decreased energy 2 3 3   Change in appetite 3 2 1   Feeling bad or failure about yourself  1 2 0  Trouble concentrating 2 1 0  Moving slowly or fidgety/restless 0 0 1  Suicidal thoughts 0 0 0  PHQ-9 Score 14 16 7   Difficult doing work/chores Somewhat difficult Somewhat difficult -    Social History   Tobacco Use   Smoking status: Never   Smokeless tobacco: Never  Substance Use Topics   Alcohol use: Yes    Alcohol/week: 0.0 - 1.0 standard drinks   Drug use: No    Review of Systems Per HPI unless specifically indicated above     Objective:    BP 112/61   Pulse 93   Ht 5\' 4"  (1.626 m)   Wt 135 lb (61.2 kg)   LMP 07/15/2015 (Within Days)   SpO2 100%   BMI 23.17 kg/m   Wt Readings from Last 3 Encounters:  09/14/21 135 lb (61.2 kg)  07/23/21 139 lb 12.8 oz (63.4 kg)  06/17/21 138 lb 12.8 oz (63 kg)    Physical Exam Vitals and nursing note reviewed.  Constitutional:      General: She is not in acute distress.    Appearance: She is  well-developed. She is not diaphoretic.     Comments: Currently ill appearing and tired, cooperative  HENT:     Head: Normocephalic and atraumatic.     Right Ear: Hearing, ear canal and external ear normal.     Left Ear: Hearing, tympanic membrane, ear canal and external ear normal.     Ears:     Comments: R ear unable to view TM due to obstructing impacted cerumen Eyes:     General:        Right eye: No discharge.        Left eye: No discharge.     Conjunctiva/sclera: Conjunctivae normal.  Neck:     Thyroid: No thyromegaly.  Cardiovascular:     Rate and Rhythm: Normal rate and regular rhythm.     Heart sounds: Normal heart sounds. No murmur heard. Pulmonary:     Effort: Pulmonary effort is normal. No respiratory distress.     Breath sounds: Normal breath sounds. No wheezing or rales.  Musculoskeletal:        General: Normal range of motion.     Cervical back: Normal range of motion and neck supple.  Lymphadenopathy:     Cervical: No cervical adenopathy.  Skin:  General: Skin is warm and dry.     Findings: No erythema or rash.  Neurological:     Mental Status: She is alert and oriented to person, place, and time.  Psychiatric:        Behavior: Behavior normal.     Comments: Well groomed, good eye contact, normal speech and thoughts      Results for orders placed or performed during the hospital encounter of 09/12/21  Covid-19, Flu A+B ([facility])   Specimen: Nasopharyngeal   Naso  Result Value Ref Range   SARS-CoV-2, NAA Not Detected Not Detected   Influenza A, NAA Not Detected Not Detected   Influenza B, NAA Not Detected Not Detected   Test Information: Comment       Assessment & Plan:   Problem List Items Addressed This Visit     Moderate persistent asthma - Primary   Relevant Medications   predniSONE (DELTASONE) 50 MG tablet   Other Visit Diagnoses     Acute non-recurrent frontal sinusitis       Relevant Medications   predniSONE (DELTASONE) 50 MG tablet    amoxicillin-clavulanate (AUGMENTIN) 875-125 MG tablet   benzonatate (TESSALON) 100 MG capsule   ipratropium (ATROVENT) 0.06 % nasal spray       Acute sinusitis Acute on chronic asthma moderate Cannot rule out initial etiology as viral syndrome Negative covid and flu  Treat with Prednisone course for asthma and sinus Start Tessalon Perls take 1 capsule up to 3 times a day as needed for cough Start Atrovent nasal spray decongestant 2 sprays in each nostril up to 4 times daily for 7 days Add empiric Augmentin for R ear and sinusitis 7 day course  Follow up if not improving  Work note given return [employer] ordered this encounter  Medications   predniSONE (DELTASONE) 50 MG tablet    Sig: Take 1 tablet (50 mg total) by mouth daily with breakfast.    Dispense:  5 tablet    Refill:  0   amoxicillin-clavulanate (AUGMENTIN) 875-125 MG tablet    Sig: Take 1 tablet by mouth 2 (two) times daily.    Dispense:  14 tablet    Refill:  0   benzonatate (TESSALON) 100 MG capsule    Sig: Take 1 capsule (100 mg total) by mouth 3 (three) times daily as needed for cough.    Dispense:  30 capsule    Refill:  0   ipratropium (ATROVENT) 0.06 % nasal spray    Sig: Place 2 sprays into both nostrils 4 (four) times daily. For up to 5-7 days then stop.    Dispense:  15 mL    Refill:  0      Follow up plan: Return if symptoms worsen or fail to improve.   Saralyn Pilar, DO [facility] 09/14/2021, 11:40 AM

## 2021-09-16 ENCOUNTER — Encounter: Payer: Self-pay | Admitting: Family Medicine

## 2021-09-16 ENCOUNTER — Ambulatory Visit: Payer: Self-pay

## 2021-09-16 DIAGNOSIS — J0110 Acute frontal sinusitis, unspecified: Secondary | ICD-10-CM

## 2021-09-16 MED ORDER — FLUTICASONE PROPIONATE 50 MCG/ACT NA SUSP: 2.0000 | Freq: Every day | NASAL | 3 refills | Status: DC

## 2021-09-16 NOTE — Telephone Encounter (Signed)
Called patient. She will continue treatment plan. Try Epley Maneuver for vertigo. Start Flonase. Cancel apt Thursday and work note given through next Tuesday her apt 10/18  Saralyn Pilar, DO [facility] 09/16/2021, 12:12 PM

## 2021-09-16 NOTE — Telephone Encounter (Signed)
Patient called and says she has been sick for a week with sinus congestion, cough, head feels full, pressure in ears and now her equilibrium is off. She was seen by PCP on 09/14/21, she says she doesn't feel any better. She's taking the antibiotics prescribed and using the nasal spray, but her symptoms are not improving. Her temp yesterday was 99.6, today normal. She says she coughed up a thick greenish looking glob of mucus this morning so hard that it caused her to be dizzy, so she had to sit down. She says she's having to hold on when walking due to the off balance she feels. She says her ears are stopped up and she knows there is wax buildup. She asked about the after visit summary that mention Flonase and the Atrovent nasal spray, she says the Flonase prescription was not sent to the pharmacy. I advised the way it reads is to use the Atrovent x 7 days then use the Flonase. She says the pharmacist told her not to use them at the same time. Other symptoms reported are no appetite, dizzy when walking, no other new symptoms reported. Advised no availability with PCP today, called the office and spoke to Rachell, [facility] who says to let the patient know that Dr. Kirtland Bouchard will review this note and if there are any recommendations someone will call her back, the only option is to schedule tomorrow. Patient advised, MyChart video appointment scheduled for tomorrow at 1120 with Dr. Althea Charon, care advice given, patient verbalized understanding. Patient asked to be scheduled for ear wax removal on Tuesday, appointment scheduled for 0900 on 09/22/21 with Dr. Althea Charon.  Reason for Disposition  [1] Recent medical visit within 24 hours AND [2] condition / symptoms SAME (unchanged) AND [3] caller has additional questions triager can answer  Answer Assessment - Initial Assessment Questions 1. MAIN CONCERN OR SYMPTOM:  "What is your main concern right now?" "What question do you have?" "What's the main symptom you're worried  about?" (e.g., breathing difficulty, cough, fever. pain)     Nasal congestion, cough, head feels full, equilibrium is off, pressure in ears 2. ONSET: "When did the symptoms start?"     1 week ago tomorrow 3. BETTER-SAME-WORSE: "Are you getting better, staying the same, or getting worse compared to how you felt at your last visit to the doctor (most recent medical visit)?"     Same 4. VISIT DATE: "When were you seen?" (Date)     09/16/21 5. VISIT DOCTOR: "What is the name of the doctor taking care of you now?"     Dr. Althea Charon 6. VISIT DIAGNOSIS:  "What was the main symptom or problem that you were seen for?" "Were you given a diagnosis?"      Sinus congestion 7. VISIT MEDICINES: "Did the doctor order any new medicines for you to use?" If Yes, ask: "Have you filled the prescription and started taking the medicine?"      Atrovent nasal spray, antibiotic amoxicillin, decongestant (mucinex), cough medication (Tessalon Pearles) 8. NEXT APPOINTMENT: "Have you scheduled a follow-up appointment with your doctor?"     No 9. PAIN: "Is there any pain?" If Yes, ask: "How bad is it?"  (Scale 0-10; or mild, moderate, severe)    - NONE (0): no pain    - MILD (1-3): doesn't interfere with normal activities     - MODERATE (4-7): interferes with normal activities or awakens from sleep     - SEVERE (8-10): excruciating pain, unable to do any  normal activities     No 10. FEVER: "Do you have a fever?" If Yes, ask: "What is it, how was it measured  and when did it start?"       Yesterday 99.6, today normal 11. OTHER SYMPTOMS: "Do you have any other symptoms?"       Felt dizzy when walk feel off balance, no appetite  Protocols used: Recent Medical Visit for Illness Follow-up Call-A-AH

## 2021-09-17 ENCOUNTER — Telehealth: Payer: No Typology Code available for payment source | Admitting: Family Medicine

## 2021-09-22 ENCOUNTER — Encounter: Payer: Self-pay | Admitting: Family Medicine

## 2021-09-22 ENCOUNTER — Other Ambulatory Visit: Payer: Self-pay

## 2021-09-22 ENCOUNTER — Ambulatory Visit (INDEPENDENT_AMBULATORY_CARE_PROVIDER_SITE_OTHER): Payer: No Typology Code available for payment source | Admitting: Family Medicine

## 2021-09-22 VITALS — BP 103/58 | HR 98 | Ht 64.0 in | Wt 133.2 lb

## 2021-09-22 DIAGNOSIS — J4541 Moderate persistent asthma with (acute) exacerbation: Secondary | ICD-10-CM | POA: Diagnosis not present

## 2021-09-22 DIAGNOSIS — H6983 Other specified disorders of Eustachian tube, bilateral: Secondary | ICD-10-CM | POA: Diagnosis not present

## 2021-09-22 NOTE — Patient Instructions (Addendum)
Thank you for coming to the office today.  Go ahead and start Prednisone and Flonase.  Note written  Will do Short Term Paperwork when we receive it. Anticipate out 2-4 weeks, max up to 6 weeks  R ear has some soft wax on side of canal, not budging with flushing. L ear is clear.  Please schedule a Follow-up Appointment to: Return in about 2 weeks (around 10/06/2021) for 2 weeks asthma / eval return to work short term disability.  If you have any other questions or concerns, please feel free to call the office or send a message through MyChart. You may also schedule an earlier appointment if necessary.  Additionally, you may be receiving a survey about your experience at our office within a few days to 1 week by e-mail or mail. We value your feedback.  Saralyn Pilar, DO [facility], [place]

## 2021-09-22 NOTE — Progress Notes (Signed)
Subjective:    Patient ID: Christina Meza, female    DOB: Nov 20, 1966, 55 y.o.   MRN: [id number]  Christina Meza is a 55 y.o. female presenting on 09/22/2021 for Ear Fullness   HPI  Eustachian Tube Dysfunction / Fullness Ear Cerumen Impaction Reduced Hearing R sided L side improved Feels fullness still She did ear flushing at home, successfully removed most wax R ear.  Moderate persistent asthma exacerbation Initial onset symptoms 09/12/21 with Urgent Care visit for viral illness, then flared up asthma Last visit 09/14/21 with me treated her for asthma and covered sinus infection as well. She was given prednisone course Recent flare up, now gradual improved. improved overall but still does not have capacity to work, she feels still short of breath coughing winded with exertion and prolong standing. 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   Health Maintenance: Flu shot this weekend, Shingrix 2nd dose as well.  Depression screen [facility] 2/9 06/17/2021 03/24/2020 05/06/2017  Decreased Interest 2 3 0  Down, Depressed, Hopeless 2 2 0  PHQ - 2 Score 4 5 0  Altered sleeping 2 3 2   Tired, decreased energy 2 3 3   Change in appetite 3 2 1   Feeling bad or failure about yourself  1 2 0  Trouble concentrating 2 1 0  Moving slowly or fidgety/restless 0 0 1  Suicidal thoughts 0 0 0  PHQ-9 Score 14 16 7   Difficult doing work/chores Somewhat difficult Somewhat difficult -    Social History   Tobacco Use   Smoking status: Never   Smokeless tobacco: Never  Substance Use Topics   Alcohol use: Yes    Alcohol/week: 0.0 - 1.0 standard drinks   Drug use: No    Review of Systems Per HPI unless  specifically indicated above     Objective:    BP (!) 103/58   Pulse 98   Ht 5\' 4"  (1.626 m)   Wt 133 lb 3.2 oz (60.4 kg)   LMP 07/15/2015 (Within Days)   SpO2 92%   BMI 22.86 kg/m   Wt Readings from Last 3 Encounters:  09/22/21 133 lb 3.2 oz (60.4 kg)  09/14/21 135 lb (61.2 kg)  07/23/21 139 lb 12.8 oz (63.4 kg)    Physical Exam Vitals and nursing note reviewed.  Constitutional:      General: She is not in acute distress.    Appearance: She is well-developed. She is not diaphoretic.     Comments: Well-appearing, comfortable, cooperative  HENT:     Head: Normocephalic and atraumatic.     Right Ear: Tympanic membrane, ear canal and external ear normal. There is no impacted cerumen.     Left Ear: Tympanic membrane, ear canal and external ear normal. There is no impacted cerumen.     Ears:     Comments: Some soft ear wax on R ear canal, not impacted Eyes:     General:        Right eye: No discharge.        Left eye: No discharge.     Conjunctiva/sclera: Conjunctivae normal.  Neck:     Thyroid: No thyromegaly.  Cardiovascular:     Rate and  Rhythm: Normal rate and regular rhythm.     Heart sounds: Normal heart sounds. No murmur heard. Pulmonary:     Effort: Pulmonary effort is normal. No respiratory distress.     Breath sounds: Wheezing present. No rales.  Musculoskeletal:        General: Normal range of motion.     Cervical back: Normal range of motion and neck supple.  Lymphadenopathy:     Cervical: No cervical adenopathy.  Skin:    General: Skin is warm and dry.     Findings: No erythema or rash.  Neurological:     Mental Status: She is alert and oriented to person, place, and time.  Psychiatric:        Behavior: Behavior normal.     Comments: Well groomed, good eye contact, normal speech and thoughts   Results for orders placed or performed during the hospital encounter of 09/12/21  Covid-19, Flu A+B ([facility])   Specimen: Nasopharyngeal   Naso  Result Value  Ref Range   SARS-CoV-2, NAA Not Detected Not Detected   Influenza A, NAA Not Detected Not Detected   Influenza B, NAA Not Detected Not Detected   Test Information: Comment       Assessment & Plan:   Problem List Items Addressed This Visit     Moderate persistent asthma - Primary   Other Visit Diagnoses     Eustachian tube dysfunction, bilateral           Asthma, moderate persistent, with exacerbation Improved on recent steroid course and breathing treatment Also have treated for potential ear/sinus infection with Augmentin Currently still has residual dyspnea and wheezing at times with exertion  Will recommend she remain out of work 2-4 weeks (she may be eligible up to 6 weeks) on Short Term Disability, awaiting paperwork from her employer  - 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   - Plan on 2-4 weeks to return  No orders of the defined types were placed in this encounter.     Follow up plan: Return in about 2 weeks (around 10/06/2021) for 2 weeks asthma / eval return to work short term disability.  Saralyn Pilar, DO [facility] 09/22/2021, 9:12 AM

## 2021-09-28 ENCOUNTER — Other Ambulatory Visit: Payer: Self-pay | Admitting: Internal Medicine

## 2021-09-28 DIAGNOSIS — F411 Generalized anxiety disorder: Secondary | ICD-10-CM

## 2021-09-28 NOTE — Telephone Encounter (Signed)
Requested Prescriptions  Pending Prescriptions Disp Refills  . busPIRone (BUSPAR) 5 MG tablet [Pharmacy Med Name: BUSPIRONE HCL 5 MG TABLET] 180 tablet 0    Sig: TAKE 1-2 TABLETS (5-10 MG TOTAL) BY MOUTH 3 (THREE) TIMES DAILY AS NEEDED (ANXIETY).     Psychiatry: Anxiolytics/Hypnotics - Non-controlled Passed - 09/28/2021  9:31 AM      Passed - Valid encounter within last 6 months    Recent Outpatient Visits          6 days ago Moderate persistent asthma with acute exacerbation   [facility] [place], Netta Neat, DO   2 weeks ago Moderate persistent asthma with acute exacerbation   [facility] Smitty Cords, DO   3 months ago Annual physical exam   [facility] Smitty Cords, DO   1 year ago Viral gastroenteritis   [facility] Smitty Cords, DO   1 year ago Depression, major, recurrent, moderate (HCC)   [facility]the[facility] Charon, Netta Neat, DO      Future Appointments            In 1 week Althea Charon, Netta Neat, DO [facility]

## 2021-10-02 ENCOUNTER — Encounter: Payer: Self-pay | Admitting: Family Medicine

## 2021-10-03 ENCOUNTER — Ambulatory Visit
Admission: RE | Admit: 2021-10-03 | Discharge: 2021-10-03 | Disposition: A | Discharge status: Home / Self Care | Payer: No Typology Code available for payment source | Source: Ambulatory Visit | Attending: Emergency Medicine | Admitting: Emergency Medicine

## 2021-10-03 ENCOUNTER — Ambulatory Visit (INDEPENDENT_AMBULATORY_CARE_PROVIDER_SITE_OTHER): Payer: No Typology Code available for payment source

## 2021-10-03 ENCOUNTER — Other Ambulatory Visit: Payer: Self-pay

## 2021-10-03 VITALS — BP 119/73 | HR 94 | Temp 100.0°F | Resp 18

## 2021-10-03 DIAGNOSIS — R0600 Dyspnea, unspecified: Secondary | ICD-10-CM | POA: Diagnosis not present

## 2021-10-03 DIAGNOSIS — B370 Candidal stomatitis: Secondary | ICD-10-CM | POA: Diagnosis not present

## 2021-10-03 DIAGNOSIS — R0609 Other forms of dyspnea: Secondary | ICD-10-CM

## 2021-10-03 MED ORDER — NYSTATIN 100000 UNIT/ML MT SUSP: 500000.0000 [IU] | Freq: Four times a day (QID) | OROMUCOSAL | 0 refills | Status: AC

## 2021-10-03 NOTE — Discharge Instructions (Addendum)
Use Nystatin as prescribed for your thrush.  Your chest x-ray is negative for any pneumonia or pneumothorax, but I will call you if there are any changes to the over read by radiology.  Keep your appointment with your PCP for follow-up on Tuesday.  If you begin to have any worsening symptoms to include worsening shortness of breath, chest pain, dizziness, fainting or uncontrolled fever please present to the emergency department for further investigation.

## 2021-10-03 NOTE — ED Provider Notes (Addendum)
CHIEF COMPLAINT:   Chief Complaint  Patient presents with   Fever   Cough   Sore Throat     SUBJECTIVE/HPI:  HPI A very pleasant 55 y.o.Female presents today with cough, sore throat, low-grade fever that started 2 days ago.  Patient believes that she has thrush as she was recently started on antibiotics and prednisone.  She reports that she developed a yeast infection and has been using over-the-counter Monistat.  She has also been using Orajel mouthwash. Patient does not report any shortness of breath, chest pain, palpitations, visual changes, weakness, tingling, headache, nausea, vomiting, diarrhea, chills.   has a past medical history of Allergy, Arthritis, Asthma, Clostridium difficile infection, Depression, Diastolic dysfunction, Genital warts, GERD (gastroesophageal reflux disease), Inappropriate sinus tachycardia, Migraine, Palpitations, Prolonged QT interval, SLE (systemic lupus erythematosus) (HCC), and Stomach ulcer.  ROS:  Review of Systems See Subjective/HPI Medications, Allergies and Problem List personally reviewed in Epic today OBJECTIVE:   Vitals:   10/03/21 1234  BP: 119/73  Pulse: 94  Resp: 18  Temp: 100 F (37.8 C)  SpO2: 96%    Physical Exam   General: Appears well-developed and well-nourished. No acute distress.  HEENT Head: Normocephalic and atraumatic.  No frontal, maxillary sinus and nasal bridge tenderness noted to palpation. Ears: Hearing grossly intact, no drainage or visible deformity.  Nose: No nasal deviation.  Erythematous and congested nasal mucosa noted bilaterally without rhinorrhea Mouth/Throat: No stridor or tracheal deviation.  + erythematous posterior pharynx noted with clear drainage present.  + white patchy exudate noted to the tongue and posterior pharynx.  Beefy red appearance to tongue. Eyes: Conjunctivae and EOM are normal. No eye drainage or scleral icterus bilaterally.  Neck: Normal range of motion, neck is supple. No cervical,  tonsillar or submandibular lymph nodes palpated.   Cardiovascular: Normal rate. Regular rhythm; no murmurs, gallops, or rubs.  Pulm/Chest: No respiratory distress. Breath sounds normal bilaterally without wheezes, rhonchi, or rales.  Neurological: Alert and oriented to person, place, and time.  Skin: Skin is warm and dry.  No rashes, lesions, abrasions or bruising noted to skin.   Psychiatric: Normal mood, affect, behavior, and thought content.   Vital signs and nursing note reviewed.   Patient stable and cooperative with examination. PROCEDURES:    LABS/X-RAYS/EKG/MEDS:   No results found for any visits on 10/03/21.  MEDICAL DECISION MAKING:   Patient presents with cough, sore throat, low-grade fever that started 2 days ago.  Patient believes that she has thrush as she was recently started on antibiotics and prednisone.  She reports that she developed a yeast infection and has been using over-the-counter Monistat.  She has also been using Orajel mouthwash. Patient does not report any shortness of breath, chest pain, palpitations, visual changes, weakness, tingling, headache, nausea, vomiting, diarrhea, chills.  Given appearance to throat/tongue, concern for thrush secondary to recent antibiotic use.  Rx nystatin to the patient's preferred pharmacy.  Chest x-ray reveals: No pneumonia or pneumothorax, as read by me over read pending.  The patient has an appointment scheduled with her PCP on Tuesday for follow-up as her PCP has been monitoring her illness.  If you begin to notice any worsening symptoms to include worsening shortness of breath, chest pain, dizziness, fainting or uncontrolled fever please present to the emergency department immediately.  Patient verbalized understanding and agreed with treatment plan.  Patient stable upon discharge. ASSESSMENT/PLAN:  1. Thrush - nystatin (MYCOSTATIN) 100000 UNIT/ML suspension; Take 5 mLs (500,000 Units total) by mouth  4 (four) times daily for 10  days.  Dispense: 60 mL; Refill: 0  2. Dyspnea on exertion Instructions about new medications and side effects provided.  Plan:   Discharge Instructions      Use Nystatin as prescribed for your thrush.  Your chest x-ray is negative for any pneumonia or pneumothorax, but I will call you if there are any changes to the over read by radiology.  Keep your appointment with your PCP for follow-up on Tuesday.  If you begin to have any worsening symptoms to include worsening shortness of breath, chest pain, dizziness, fainting or uncontrolled fever please present to the emergency department for further investigation.         Christina Greenhouse, FNP 10/03/21 1316    Christina Greenhouse, FNP 10/03/21 1318

## 2021-10-03 NOTE — ED Triage Notes (Signed)
Patient presents to Urgent Care with complaints of cough, sore throat, low grade fever since 2 days ago. Pt believes she has thrush. Pt states she was on antibiotics and prednisone and developed yeast infection. Treated with OTC monistat. Using origel mouth wash.

## 2021-10-06 ENCOUNTER — Encounter: Payer: Self-pay | Admitting: Family Medicine

## 2021-10-06 ENCOUNTER — Other Ambulatory Visit: Payer: Self-pay

## 2021-10-06 ENCOUNTER — Ambulatory Visit (INDEPENDENT_AMBULATORY_CARE_PROVIDER_SITE_OTHER): Payer: No Typology Code available for payment source | Admitting: Family Medicine

## 2021-10-06 VITALS — BP 105/64 | HR 85 | Ht 64.0 in | Wt 132.2 lb

## 2021-10-06 DIAGNOSIS — B370 Candidal stomatitis: Secondary | ICD-10-CM | POA: Diagnosis not present

## 2021-10-06 DIAGNOSIS — J4541 Moderate persistent asthma with (acute) exacerbation: Secondary | ICD-10-CM

## 2021-10-06 MED ORDER — ALBUTEROL SULFATE HFA 108 (90 BASE) MCG/ACT IN AERS
2.0000 | INHALATION_SPRAY | Freq: Four times a day (QID) | RESPIRATORY_TRACT | 0 refills | Status: DC | PRN
Start: 2021-10-06 — End: 2021-10-28

## 2021-10-06 MED ORDER — SYMBICORT 160-4.5 MCG/ACT IN AERO
2.0000 | INHALATION_SPRAY | Freq: Two times a day (BID) | RESPIRATORY_TRACT | 12 refills | Status: DC
Start: 2021-10-06 — End: 2023-08-03

## 2021-10-06 MED ORDER — FLUCONAZOLE 150 MG PO TABS: ORAL_TABLET | ORAL | 1 refills | Status: DC

## 2021-10-06 MED ORDER — PREDNISONE 10 MG PO TABS: ORAL_TABLET | ORAL | 0 refills | Status: DC

## 2021-10-06 NOTE — Progress Notes (Signed)
Subjective:    Patient ID: Christina Meza, female    DOB: Feb 05, 1966, 55 y.o.   MRN: [id number]  Christina Meza is a 55 y.o. female presenting on 10/06/2021 for Asthma   HPI  Moderate Asthma exacerbation, flare - improving Last visit 09/22/21, treated for asthma, and completed Short Term Disability paperwork sent out on 09/23/21, however she reports today that the company had not received it, and requested it to be re-faxed Overall improved to 85% back to baseline. But still not resolved. If lay flat, feels short of breath. She has shortness of breath with talking. She has no sign of infection fever chills or other concerns. She has improved other symptoms Still agrees to return to work on next week Mon 10/12/21  Recently has dx with oral thrush, tried nystatin did not resolve issue it has improved. Needs albuterol PRN rescue inhaler.     Depression screen [facility] 2/9 06/17/2021 03/24/2020 05/06/2017  Decreased Interest 2 3 0  Down, Depressed, Hopeless 2 2 0  PHQ - 2 Score 4 5 0  Altered sleeping 2 3 2   Tired, decreased energy 2 3 3   Change in appetite 3 2 1   Feeling bad or failure about yourself  1 2 0  Trouble concentrating 2 1 0  Moving slowly or fidgety/restless 0 0 1  Suicidal thoughts 0 0 0  PHQ-9 Score 14 16 7   Difficult doing work/chores Somewhat difficult Somewhat difficult -    Social History   Tobacco Use   Smoking status: Never   Smokeless tobacco: Never  Substance Use Topics   Alcohol use: Yes    Alcohol/week: 0.0 - 1.0 standard drinks   Drug use: No    Review of Systems Per HPI unless specifically indicated above     Objective:    BP 105/64   Pulse 85   Ht 5\' 4"  (1.626 m)   Wt 132 lb 3.2 oz (60 kg)   LMP 07/15/2015 (Within Days)   SpO2 100%   BMI 22.69 kg/m   Wt Readings from Last 3 Encounters:  10/06/21 132 lb 3.2 oz (60 kg)  09/22/21 133 lb 3.2 oz (60.4 kg)  09/14/21 135 lb (61.2 kg)    Physical Exam Vitals and nursing note reviewed.   Constitutional:      General: She is not in acute distress.    Appearance: She is well-developed. She is not diaphoretic.     Comments: Well-appearing, comfortable, cooperative  HENT:     Head: Normocephalic and atraumatic.  Eyes:     General:        Right eye: No discharge.        Left eye: No discharge.     Conjunctiva/sclera: Conjunctivae normal.  Neck:     Thyroid: No thyromegaly.  Cardiovascular:     Rate and Rhythm: Normal rate and regular rhythm.     Heart sounds: Normal heart sounds. No murmur heard. Pulmonary:     Effort: Pulmonary effort is normal. No respiratory distress.     Breath sounds: Normal breath sounds. No wheezing or rales.     Comments: Improved respiratory exam, improved air movement Musculoskeletal:        General: Normal range of motion.     Cervical back: Normal range of motion and neck supple.  Lymphadenopathy:     Cervical: No cervical adenopathy.  Skin:    General: Skin is warm and dry.     Findings: No erythema or rash.  Neurological:  Mental Status: She is alert and oriented to person, place, and time.  Psychiatric:        Behavior: Behavior normal.     Comments: Well groomed, good eye contact, normal speech and thoughts   Results for orders placed or performed during the hospital encounter of 09/12/21  Covid-19, Flu A+B ([facility])   Specimen: Nasopharyngeal   Naso  Result Value Ref Range   SARS-CoV-2, NAA Not Detected Not Detected   Influenza A, NAA Not Detected Not Detected   Influenza B, NAA Not Detected Not Detected   Test Information: Comment       Assessment & Plan:   Problem List Items Addressed This Visit     Moderate persistent asthma - Primary   Relevant Medications   SYMBICORT 160-4.5 MCG/ACT inhaler   albuterol (VENTOLIN HFA) 108 (90 Base) MCG/ACT inhaler   predniSONE (DELTASONE) 10 MG tablet   Other Visit Diagnoses     Oral thrush       Relevant Medications   fluconazole (DIFLUCAN) 150 MG tablet        Asthma moderate exacerbation, improving Now 85% improved by her report Still remains out of work on short term disability due to asthma flare Out from 10/7 until 11/7 Start Prednisone taper Use diflucan anti yeast medication Stop Flovent Use Symbicort 2 puff twice a day, stronger asthma maintenance therapy, we can switch it if this is not covered Rescue inhaler PRN albuterol  Work note return 10/12/21 We faxed the Short Term Disability form to them on 09/23/21, but we can re-fax today and you can have copy.   Meds ordered this encounter  Medications   SYMBICORT 160-4.5 MCG/ACT inhaler    Sig: Inhale 2 puffs into the lungs in the morning and at bedtime.    Dispense:  1 each    Refill:  12   albuterol (VENTOLIN HFA) 108 (90 Base) MCG/ACT inhaler    Sig: Inhale 2 puffs into the lungs every 6 (six) hours as needed for wheezing or shortness of breath.    Dispense:  8 g    Refill:  0   fluconazole (DIFLUCAN) 150 MG tablet    Sig: Take one tablet by mouth on Day 1. Repeat dose 2nd tablet on Day 3.    Dispense:  2 tablet    Refill:  1   predniSONE (DELTASONE) 10 MG tablet    Sig: Take 6 tabs with breakfast Day 1, 5 tabs Day 2, 4 tabs Day 3, 3 tabs Day 4, 2 tabs Day 5, 1 tab Day 6.    Dispense:  21 tablet    Refill:  0     Follow up plan: Return if symptoms worsen or fail to improve.  Saralyn Pilar, DO [facility] 10/06/2021, 2:29 PM

## 2021-10-06 NOTE — Patient Instructions (Addendum)
Thank you for coming to the office today.  Start Prednisone taper Use diflucan anti yeast medication Stop Flovent Use Symbicort 2 puff twice a day, stronger asthma maintenance therapy, we can switch it if this is not covered Rescue inhaler PRN Work note return 10/12/21 We faxed the Short Term Disability form to them on 09/23/21, but we can re-fax today and you can have copy.  Please schedule a Follow-up Appointment to: Return if symptoms worsen or fail to improve.  If you have any other questions or concerns, please feel free to call the office or send a message through MyChart. You may also schedule an earlier appointment if necessary.  Additionally, you may be receiving a survey about your experience at our office within a few days to 1 week by e-mail or mail. We value your feedback.  Saralyn Pilar, DO [facility], [place]

## 2021-10-18 ENCOUNTER — Ambulatory Visit
Admission: EM | Admit: 2021-10-18 | Discharge: 2021-10-18 | Disposition: A | Discharge status: Home / Self Care | Payer: No Typology Code available for payment source | Attending: Emergency Medicine | Admitting: Emergency Medicine

## 2021-10-18 ENCOUNTER — Encounter: Payer: Self-pay | Admitting: Emergency Medicine

## 2021-10-18 ENCOUNTER — Other Ambulatory Visit: Payer: Self-pay

## 2021-10-18 DIAGNOSIS — L089 Local infection of the skin and subcutaneous tissue, unspecified: Secondary | ICD-10-CM | POA: Diagnosis not present

## 2021-10-18 DIAGNOSIS — Z23 Encounter for immunization: Secondary | ICD-10-CM | POA: Diagnosis not present

## 2021-10-18 MED ORDER — TETANUS-DIPHTH-ACELL PERTUSSIS 5-2.5-18.5 LF-MCG/0.5 IM SUSY
0.5000 mL | PREFILLED_SYRINGE | Freq: Once | INTRAMUSCULAR | Status: AC
  Administered 2021-10-18: 0.5 mL via INTRAMUSCULAR

## 2021-10-18 MED ORDER — DOXYCYCLINE HYCLATE 100 MG PO CAPS: 100.0000 mg | ORAL_CAPSULE | Freq: Two times a day (BID) | ORAL | 0 refills | Status: DC

## 2021-10-18 NOTE — ED Triage Notes (Signed)
Pt injured right hand 4th finger several times over the past week. She wants to make sure it is not infected

## 2021-10-18 NOTE — ED Provider Notes (Signed)
Chief Complaint   Chief Complaint  Patient presents with   Finger Injury     Subjective, HPI  Christina Meza is a very pleasant 55 y.o. female who presents with right hand fourth finger concern.  Patient reports that she has injured this finger several times in the last week.  Patient is concerned for infection.  No fever, chills, vomiting or additional symptoms reported today.  History obtained from patient.   Patient's problem list, past medical and social history, medications, and allergies were reviewed by me and updated in Epic.  ROS  See HPI.  Objective   Vitals:   10/18/21 1245  BP: 129/80  Pulse: 86  Temp: (!) 97.3 F (36.3 C)  SpO2: 95%    Vital signs and nursing note reviewed.  General: Appears well-developed and well-nourished. No acute distress.  HEENT: Normocephalic, atraumatic, hearing grossly intact. EOMI, no drainage. No rhinorrhea. Moist mucous membranes.  Neck: Normal range of motion, neck is supple.  Cardiovascular: Normal rate.  Pulm/Chest: No respiratory distress.   Musculoskeletal: No joint deformity, normal range of motion.  Skin: Right hand fourth finger distal exhibits redness with streaking and swelling.  There is a small healing puncture wound noted to the pad of this finger and at the distal finger there is an abrasion noted beneath the nail extending to the distal fingertip.  No drainage.  Data  No results found for any visits on 10/18/21.   Assessment & Plan  1. Finger infection - doxycycline (VIBRAMYCIN) 100 MG capsule; Take 1 capsule (100 mg total) by mouth 2 (two) times daily.  Dispense: 20 capsule; Refill: 0  55 y.o. female presents with right hand fourth finger concern.  Patient reports that she has injured this finger several times in the last week.  Patient is concerned for infection.  No fever, chills, vomiting or additional symptoms reported today.  Given symptoms along with assessment findings, likely finger infection.  Rx  doxycycline to the patient's preferred pharmacy.  Did update Tdap in clinic today.  Return for any increased pain, redness, streaking, pus drainage or fever.  Patient verbalized understanding and agreed with plan.  Patient stable upon discharge.  Return as needed.  Plan:   Discharge Instructions      Take Doxycycline as prescribed  Your Tdap has been updated in clinic today.  You will not need another one for the next 10 years unless you have an injury which she will need to be updated within a 5-year timeframe.  Return for any worsening symptoms such as increased pain, redness, streaking, pus drainage or fever.         Amalia Greenhouse, FNP 10/18/21 1344

## 2021-10-18 NOTE — Discharge Instructions (Signed)
Take Doxycycline as prescribed  Your Tdap has been updated in clinic today.  You will not need another one for the next 10 years unless you have an injury which she will need to be updated within a 5-year timeframe.  Return for any worsening symptoms such as increased pain, redness, streaking, pus drainage or fever.

## 2021-10-22 ENCOUNTER — Other Ambulatory Visit: Payer: Self-pay | Admitting: Family Medicine

## 2021-10-22 DIAGNOSIS — F411 Generalized anxiety disorder: Secondary | ICD-10-CM

## 2021-10-22 NOTE — Telephone Encounter (Signed)
Requested medications are due for refill today yes  Requested medications are on the active medication list yes  Last refill 09/28/21  Last visit 06/17/21, asked to return in 2 months for anxiety.  Future visit scheduled NO, given a curtesy refill 09/28/21, no upcoming appt scheduled.  Notes to clinic Has already had a curtesy refill and there is no upcoming appointment scheduled.

## 2021-10-28 ENCOUNTER — Other Ambulatory Visit: Payer: Self-pay | Admitting: Family Medicine

## 2021-10-28 DIAGNOSIS — J4541 Moderate persistent asthma with (acute) exacerbation: Secondary | ICD-10-CM

## 2021-10-28 NOTE — Telephone Encounter (Signed)
Requested Prescriptions  Pending Prescriptions Disp Refills  . albuterol (VENTOLIN HFA) 108 (90 Base) MCG/ACT inhaler [Pharmacy Med Name: ALBUTEROL HFA (PROAIR) INHALER] 8.5 each 2    Sig: TAKE 2 PUFFS BY MOUTH EVERY 6 HOURS AS NEEDED FOR WHEEZE OR SHORTNESS OF BREATH     Pulmonology:  Beta Agonists Failed - 10/28/2021  1:52 AM      Failed - One inhaler should last at least one month. If the patient is requesting refills earlier, contact the patient to check for uncontrolled symptoms.      Passed - Valid encounter within last 12 months    Recent Outpatient Visits          3 weeks ago Moderate persistent asthma with acute exacerbation   [facility] [place], Netta Neat, DO   1 month ago Moderate persistent asthma with acute exacerbation   [facility] [place], Netta Neat, DO   1 month ago Moderate persistent asthma with acute exacerbation   [facility] [place], Netta Neat, DO   4 months ago Annual physical exam   [facility] Smitty Cords, DO   1 year ago Viral gastroenteritis   [facility] [place], Netta Neat, [place]

## 2021-12-06 ENCOUNTER — Other Ambulatory Visit: Payer: Self-pay | Admitting: Family Medicine

## 2021-12-06 DIAGNOSIS — F32A Depression, unspecified: Secondary | ICD-10-CM

## 2021-12-06 DIAGNOSIS — F419 Anxiety disorder, unspecified: Secondary | ICD-10-CM

## 2021-12-08 NOTE — Telephone Encounter (Signed)
Patient will need a follow up appointment for further refills. Requested Prescriptions  Pending Prescriptions Disp Refills   DULoxetine (CYMBALTA) 60 MG capsule [Pharmacy Med Name: DULOXETINE CAP 60MG  DR] 90 capsule 1    Sig: TAKE 1 CAPSULE DAILY     Psychiatry: Antidepressants - SNRI Passed - 12/06/2021  8:15 AM      Passed - Completed PHQ-2 or PHQ-9 in the last 360 days      Passed - Last BP in normal range    BP Readings from Last 1 Encounters:  10/18/21 129/80         Passed - Valid encounter within last 6 months    Recent Outpatient Visits          2 months ago Moderate persistent asthma with acute exacerbation   [facility] [place], [place], [place]   2 months ago Moderate persistent asthma with acute exacerbation   [facility] [place], [place], DO   2 months ago Moderate persistent asthma with acute exacerbation   [facility] [place], [place], DO   5 months ago Annual physical exam   [facility], DO   1 year ago Viral gastroenteritis   [facility] [place], [place], Netta Neat

## 2021-12-18 ENCOUNTER — Other Ambulatory Visit: Payer: Self-pay | Admitting: Family Medicine

## 2021-12-18 DIAGNOSIS — J0110 Acute frontal sinusitis, unspecified: Secondary | ICD-10-CM

## 2021-12-18 NOTE — Telephone Encounter (Signed)
Requested medication (s) are due for refill today: yes, however, MD stated not to use continuously  Requested medication (s) are on the active medication list: yes  Last refill:  09/16/21  Future visit scheduled: no  Notes to clinic:  current rx states to take for 4 weeks then stop and use as needed. Please assess.  Requested Prescriptions  Pending Prescriptions Disp Refills   fluticasone (FLONASE) 50 MCG/ACT nasal spray [Pharmacy Med Name: FLUTICASONE PROP 50 MCG SPRAY] 48 mL 1    Sig: Place 2 sprays into both nostrils daily. Use for 4-6 weeks then stop and use seasonally or as needed.     Ear, Nose, and Throat: Nasal Preparations - Corticosteroids Passed - 12/18/2021  1:32 AM      Passed - Valid encounter within last 12 months    Recent Outpatient Visits           2 months ago Moderate persistent asthma with acute exacerbation   [place], DO   2 months ago Moderate persistent asthma with acute exacerbation   [place], DO   3 months ago Moderate persistent asthma with acute exacerbation   [place], DO   6 months ago Annual physical exam   [facility] Olin Hauser, DO   1 year ago Viral gastroenteritis   [place], Devonne Doughty, [place]

## 2021-12-20 ENCOUNTER — Other Ambulatory Visit: Payer: Self-pay | Admitting: Family Medicine

## 2021-12-20 DIAGNOSIS — B370 Candidal stomatitis: Secondary | ICD-10-CM

## 2021-12-20 NOTE — Telephone Encounter (Signed)
Requested medication (s) are due for refill today: yes  Requested medication (s) are on the active medication list: yes  Last refill:  10/06/21 #2 1 RF  Future visit scheduled: no  Notes to clinic:  med not assigned to a protocol   Requested Prescriptions  Pending Prescriptions Disp Refills   fluconazole (DIFLUCAN) 150 MG tablet [Pharmacy Med Name: FLUCONAZOLE 150 MG TABLET] 2 tablet 1    Sig: Take one tablet by mouth on Day 1. Repeat dose 2nd tablet on Day 3.     Off-Protocol Failed - 12/20/2021  4:24 PM      Failed - Medication not assigned to a protocol, review manually.      Passed - Valid encounter within last 12 months    Recent Outpatient Visits           2 months ago Moderate persistent asthma with acute exacerbation   [facility] [place], Netta Neat, DO   2 months ago Moderate persistent asthma with acute exacerbation   [facility] [place], Netta Neat, DO   3 months ago Moderate persistent asthma with acute exacerbation   [facility] Smitty Cords, DO   6 months ago Annual physical exam   [facility] Smitty Cords, DO   1 year ago Viral gastroenteritis   [facility] [place], Netta Neat, [place]

## 2021-12-21 ENCOUNTER — Encounter: Payer: Self-pay | Admitting: Family Medicine

## 2022-02-10 ENCOUNTER — Other Ambulatory Visit: Payer: Self-pay | Admitting: Family Medicine

## 2022-02-10 DIAGNOSIS — J4541 Moderate persistent asthma with (acute) exacerbation: Secondary | ICD-10-CM

## 2022-02-10 NOTE — Telephone Encounter (Signed)
Requested Prescriptions  ?Pending Prescriptions Disp Refills  ?? albuterol (VENTOLIN HFA) 108 (90 Base) MCG/ACT inhaler [Pharmacy Med Name: ALBUTEROL HFA (PROAIR) INHALER] 8.5 each 2  ?  Sig: TAKE 2 PUFFS BY MOUTH EVERY 6 HOURS AS NEEDED FOR WHEEZE OR SHORTNESS OF BREATH  ?  ? Pulmonology:  Beta Agonists 2 Passed - 02/10/2022  1:46 AM  ?  ?  Passed - Last BP in normal range  ?  BP Readings from Last 1 Encounters:  ?10/18/21 129/80  ?   ?  ?  Passed - Last Heart Rate in normal range  ?  Pulse Readings from Last 1 Encounters:  ?10/18/21 86  ?   ?  ?  Passed - Valid encounter within last 12 months  ?  Recent Outpatient Visits   ?      ? 4 months ago Moderate persistent asthma with acute exacerbation  ? [place], DO  ? 4 months ago Moderate persistent asthma with acute exacerbation  ? [place], DO  ? 4 months ago Moderate persistent asthma with acute exacerbation  ? [place], DO  ? 7 months ago Annual physical exam  ? [place], DO  ? 1 year ago Viral gastroenteritis  ? [place], DO  ?  ?  ? ?  ?  ?  ? ?

## 2022-04-13 ENCOUNTER — Other Ambulatory Visit: Payer: Self-pay

## 2022-04-13 ENCOUNTER — Emergency Department
Admission: EM | Admit: 2022-04-13 | Discharge: 2022-04-13 | Discharge status: Against Medical Advice | Attending: Emergency Medicine | Admitting: Emergency Medicine

## 2022-04-13 DIAGNOSIS — Z5321 Procedure and treatment not carried out due to patient leaving prior to being seen by health care provider: Secondary | ICD-10-CM | POA: Diagnosis not present

## 2022-04-13 DIAGNOSIS — S6991XA Unspecified injury of right wrist, hand and finger(s), initial encounter: Secondary | ICD-10-CM | POA: Diagnosis present

## 2022-04-13 DIAGNOSIS — S61411A Laceration without foreign body of right hand, initial encounter: Secondary | ICD-10-CM | POA: Diagnosis not present

## 2022-04-13 DIAGNOSIS — Y990 Civilian activity done for income or pay: Secondary | ICD-10-CM | POA: Diagnosis not present

## 2022-04-13 DIAGNOSIS — M25561 Pain in right knee: Secondary | ICD-10-CM | POA: Insufficient documentation

## 2022-04-13 DIAGNOSIS — W010XXA Fall on same level from slipping, tripping and stumbling without subsequent striking against object, initial encounter: Secondary | ICD-10-CM | POA: Insufficient documentation

## 2022-04-13 NOTE — ED Triage Notes (Signed)
Pt comes with c/o right hand injury with open laceration. Pt states right knee pain as well. Pt states she tripped and fell at work.  ? ?Pt wishes to file for workers comp. 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 ?

## 2022-04-14 ENCOUNTER — Ambulatory Visit: Payer: Self-pay

## 2022-04-14 NOTE — Telephone Encounter (Signed)
?  Chief Complaint: Dizzy 2x  - one  fall ?Symptoms: dizzy ?Frequency: 2 times since 03/04/2022 ?Pertinent Negatives: Patient denies current dizziness, Fever, weakness ?Disposition: [] ED /[] Urgent Care (no appt availability in office) / [x] Appointment(In office/virtual)/ []  [facility]/ [] Home Care/ [] Refused Recommended Disposition /[] [facility]/ []  Follow-up with PCP ?Additional Notes: Pt has had 2 instances of dizziness. The first happened 03/04/2022 at a lupus appt. The 2nd happened yesterday at work. PT was moving quickly at work when she felt odd/dizzy and fell.  ?Reason for Disposition ? [1] Fall AND [2] went to emergency department for evaluation or treatment ? ?Answer Assessment - Initial Assessment Questions ?1. MeCHANISM: "How did the fall happen?" ?    Fell at work after moving swiftly down the happ ?2. DOMESTIC VIOLENCE AND ELDER ABUSE SCREENING: "Did you fall because someone pushed you or tried to hurt you?" If Yes, ask: "Are you safe now?" ?    no ?3. ONSET: "When did the fall happen?" (e.g., minutes, hours, or days ago) ?    At work yesterday ?4. LOCATION: "What part of the body hit the ground?" (e.g., back, buttocks, head, hips, knees, hands, head, stomach) ?    Knees hand ?5. INJURY: "Did you hurt (injure) yourself when you fell?" If Yes, ask: "What did you injure? Tell me more about this?" (e.g., body area; type of injury; pain severity)" ?    Cut hand ?6. PAIN: "Is there any pain?" If Yes, ask: "How bad is the pain?" (e.g., Scale 1-10; or mild,  ?moderate, severe) ?  - NONE (0): No pain ?  - MILD (1-3): Doesn't interfere with normal activities  ?  - MODERATE (4-7): Interferes with normal activities or awakens from sleep  ?  - SEVERE (8-10): Excruciating pain, unable to do any normal activities  ?     ?7. SIZE: For cuts, bruises, or swelling, ask: "How large is it?" (e.g., inches or centimeters)  ?    Stitched up ?8. PREGNANCY: "Is there any chance you are pregnant?" "When  was your last menstrual period?" ?    na ?9. OTHER SYMPTOMS: "Do you have any other symptoms?" (e.g., dizziness, fever, weakness; new onset or worsening).  ?    Has been dizzy ?10. CAUSE: "What do you think caused the fall (or falling)?" (e.g., tripped, dizzy spell) ?      Unsure. ? ?Protocols used: Falls and Falling-A-AH ? ?

## 2022-04-21 ENCOUNTER — Ambulatory Visit (INDEPENDENT_AMBULATORY_CARE_PROVIDER_SITE_OTHER): Payer: No Typology Code available for payment source | Admitting: Family Medicine

## 2022-04-21 ENCOUNTER — Encounter: Payer: Self-pay | Admitting: Family Medicine

## 2022-04-21 VITALS — BP 102/64 | Ht 64.0 in | Wt 134.6 lb

## 2022-04-21 DIAGNOSIS — R55 Syncope and collapse: Secondary | ICD-10-CM

## 2022-04-21 NOTE — Patient Instructions (Addendum)
Thank you for coming to the office today. ? ?I would recommend referral to Neurologist. If you can ask your mother's Neuro at [facility] that would be great. ? ?Please check with Dr Corrinne Eagle about if she would be a good fit for referral for you. ? ?I think this may be Vasovagal Episodes causing near or full syncope. ? ?Also with your peripheral neuropathy can investigate that ? ?I would suggest MRI Brain - either we can order if they have specific instructions or would leave to Neurologist. ? ?May even need EEG in future, she specializes in that. ? ?Melba Coon, MBChB ?Address: [address], [place], [place] [address] ?Phone: [phone] ? ?-------------------------------------- ? ?[facility] - Neurology Dept ?[address] ?[place], [place] [address] ?Phone: [phone] ? ?[facility]   ?Address: [address], [place], [place] [address] ?Hours: 8AM-5PM ?Phone: [phone] ? ?[facility] ?[address] ?[address] ?[place], [place] [address] ?Phone: [phone] ?Fax: [phone] ? ? ?Please schedule a Follow-up Appointment to: Return if symptoms worsen or fail to improve. ? ?If you have any other questions or concerns, please feel free to call the office or send a message through MyChart. You may also schedule an earlier appointment if necessary. ? ?Additionally, you may be receiving a survey about your experience at our office within a few days to 1 week by e-mail or mail. We value your feedback. ? ?Saralyn Pilar, DO ?[facility] [place], [place] ?

## 2022-04-21 NOTE — Progress Notes (Signed)
? ?Subjective:  ? ? Patient ID: Christina Meza, female    DOB: Apr 13, 1966, 56 y.o.   MRN: [id number] ? ?Christina Meza is a 56 y.o. female presenting on 04/21/2022 for Dizziness and Fatigue ? ? ?HPI ? ?Vasovagal Episodes ? ?She had visit 03/04/22 with Lupus specialist at [place]. She was on the way there and doing well. She was getting her mother's wheelchair out of vehicle. And she had a sudden episode of all of her energy drained out of her. She felt diaphoretic, nausea, felt weakness. She started to push wheelchair and felt worse. She called for help and she got in to the clinic, she went to bathroom, vomited some coffee she just drank. She still felt drained. LIghtheadedness. She felt like she was going to pass out. She had hypoglycemia at 51 checked in doctors office. She was given apple juice. Neuro tests and evaluation, sent to urgent care. They did evaluation there and checked B12 level considered MRI Brain if that was normal. She had result that was mild low 310, she was advised to start B12. CBC similar to previous with mild Macrocytic Anemia 11.1 Hgb ? ?She went to an IV infusion center to receive B12 and did feel better. She was going there every few weeks, has completed 5 B12 series. ? ?Now recent history 04/13/22, at work with a Code situation called. She was jogging to the code. She felt legs not keeping up with her and then she found herself on the floor, witness said she may have slipped. She went to ED and wait time was too long, she felt better, then was told to go to Urgent Care and they sent her to Employee Health since injury at work. She had treatment to hand for laceration and bruised knee.  ? ?They did not pursue any other diagnostic or treatment for the near syncopal event. ? ? ?Bilatreal feet with numbness tingling present in her toes. She has had this for a while. She did not get a chance to talk to Rheumatologist about this yet. She admits some neuropathic pain in R foot  great toe felt a sharp. ? ? ? ?  04/21/2022  ?  9:34 AM 06/17/2021  ?  1:29 PM 03/24/2020  ? 11:06 AM  ?Depression screen PHQ 2/9  ?Decreased Interest 0 2 3  ?Down, Depressed, Hopeless 0 2 2  ?PHQ - 2 Score 0 4 5  ?Altered sleeping 1 2 3   ?Tired, decreased energy 3 2 3   ?Change in appetite 1 3 2   ?Feeling bad or failure about yourself  0 1 2  ?Trouble concentrating 1 2 1   ?Moving slowly or fidgety/restless 0 0 0  ?Suicidal thoughts 0 0 0  ?PHQ-9 Score 6 14 16   ?Difficult doing work/chores Not difficult at all Somewhat difficult Somewhat difficult  ? ? ?Social History  ? ?Tobacco Use  ? Smoking status: Never  ? Smokeless tobacco: Never  ?Vaping Use  ? Vaping Use: Never used  ?Substance Use Topics  ? Alcohol use: Yes  ?  Alcohol/week: 0.0 - 1.0 standard drinks  ? Drug use: No  ? ? ?Review of Systems ?Per HPI unless specifically indicated above ? ?   ?Objective:  ?  ?BP 102/64   Ht 5\' 4"  (1.626 m)   Wt 134 lb 9.6 oz (61.1 kg)   LMP 07/15/2015 (Within Days)   BMI 23.10 kg/m?   ?Wt Readings from Last 3 Encounters:  ?04/21/22 134 lb 9.6 oz (61.1 kg)  ?  10/06/21 132 lb 3.2 oz (60 kg)  ?09/22/21 133 lb 3.2 oz (60.4 kg)  ?  ?Physical Exam ?Vitals and nursing note reviewed.  ?Constitutional:   ?   General: She is not in acute distress. ?   Appearance: She is well-developed. She is not diaphoretic.  ?   Comments: Well-appearing, comfortable, cooperative  ?HENT:  ?   Head: Normocephalic and atraumatic.  ?Eyes:  ?   General:     ?   Right eye: No discharge.     ?   Left eye: No discharge.  ?   Conjunctiva/sclera: Conjunctivae normal.  ?Neck:  ?   Thyroid: No thyromegaly.  ?Cardiovascular:  ?   Rate and Rhythm: Normal rate and regular rhythm.  ?   Heart sounds: Normal heart sounds. No murmur heard. ?Pulmonary:  ?   Effort: Pulmonary effort is normal. No respiratory distress.  ?   Breath sounds: Normal breath sounds. No wheezing or rales.  ?Musculoskeletal:     ?   General: Normal range of motion.  ?   Cervical back: Normal range  of motion and neck supple.  ?Lymphadenopathy:  ?   Cervical: No cervical adenopathy.  ?Skin: ?   General: Skin is warm and dry.  ?   Findings: No erythema or rash.  ?Neurological:  ?   Mental Status: She is alert and oriented to person, place, and time.  ?Psychiatric:     ?   Behavior: Behavior normal.  ?   Comments: Well groomed, good eye contact, normal speech and thoughts  ? ? ? ? ?Results for orders placed or performed during the hospital encounter of 09/12/21  ?Covid-19, Flu A+B ([facility])  ? Specimen: Nasopharyngeal  ? Naso  ?Result Value Ref Range  ? SARS-CoV-2, NAA Not Detected Not Detected  ? Influenza A, NAA Not Detected Not Detected  ? Influenza B, NAA Not Detected Not Detected  ? Test Information: Comment   ? ?   ?Assessment & Plan:  ? ?Problem List Items Addressed This Visit   ?None ?Visit Diagnoses   ? ? Vasovagal episode    -  Primary  ? ?  ?  ?Suspected most likely vasovagal syncopal episode given history and reassuring clinical exam, concern with recurrence episodess x 2 in 2 weeks, however no significant red flag symptoms associated with episodes to suggest a more serious possible cardiac or neurologic etiology. May be with stress / nutrition / exertion and other factors. ?Reviewed prior work up already at [place] ? ?Plan: ?1. Reassurance, counseled on vasovagal syncope ?2. Improve hydration, caution significant exertion quick standing or position change ? ?I would recommend referral to Neurologist consultation next since you have had a repeat episode ? ?Other alternative would be Cardiology consultation if no neurological cause identified. ? ?Also with your peripheral neuropathy can investigate that ? ?I would suggest MRI Brain - either we can order if they have specific instructions or would leave to Neurologist. ? ?May even need EEG in future, she specializes in that. ? ?Note patient is going to [facility] today w/ mother and she will ask that provider about referral. She will notify me by mychart  message which location she prefers for Neurology referral soon. Depending on wait time we can order MRI of Brain if needed or would defer to Neurology. ? ?Return precautions given. ? ?No orders of the defined types were placed in this encounter. ? ? ? ? ?Follow up plan: ?Return if symptoms worsen or fail to  improve. ? ? ?Nobie Putnam, DO ?[facility] ?[facility] ?04/21/2022, 9:16 AM ?

## 2022-04-22 ENCOUNTER — Encounter: Payer: Self-pay | Admitting: Family Medicine

## 2022-04-22 DIAGNOSIS — R55 Syncope and collapse: Secondary | ICD-10-CM

## 2022-04-28 ENCOUNTER — Other Ambulatory Visit: Payer: Self-pay | Admitting: Family Medicine

## 2022-04-28 DIAGNOSIS — F411 Generalized anxiety disorder: Secondary | ICD-10-CM

## 2022-04-29 NOTE — Telephone Encounter (Signed)
Requested Prescriptions  Pending Prescriptions Disp Refills  . busPIRone (BUSPAR) 5 MG tablet [Pharmacy Med Name: BUSPIRONE HCL 5 MG TABLET] 540 tablet 0    Sig: TAKE 1-2 TABLETS (5-10 MG TOTAL) BY MOUTH 3 (THREE) TIMES DAILY AS NEEDED (ANXIETY).     Psychiatry: Anxiolytics/Hypnotics - Non-controlled Passed - 04/28/2022  2:02 AM      Passed - Valid encounter within last 12 months    Recent Outpatient Visits          1 week ago Vasovagal episode   [place], DO   6 months ago Moderate persistent asthma with acute exacerbation   [place], DO   7 months ago Moderate persistent asthma with acute exacerbation   [place], DO   7 months ago Moderate persistent asthma with acute exacerbation   [place], DO   10 months ago Annual physical exam   [employer], Devonne Doughty, DO

## 2022-05-28 ENCOUNTER — Telehealth: Payer: Self-pay

## 2022-05-31 ENCOUNTER — Other Ambulatory Visit: Payer: Self-pay | Admitting: Family Medicine

## 2022-05-31 DIAGNOSIS — Z1231 Encounter for screening mammogram for malignant neoplasm of breast: Secondary | ICD-10-CM

## 2022-06-01 ENCOUNTER — Encounter: Payer: Self-pay | Admitting: Neurology

## 2022-06-01 ENCOUNTER — Ambulatory Visit (INDEPENDENT_AMBULATORY_CARE_PROVIDER_SITE_OTHER): Payer: No Typology Code available for payment source | Admitting: Neurology

## 2022-06-01 VITALS — BP 107/69 | HR 74 | Ht 64.0 in | Wt 135.0 lb

## 2022-06-01 DIAGNOSIS — R404 Transient alteration of awareness: Secondary | ICD-10-CM

## 2022-06-01 DIAGNOSIS — M329 Systemic lupus erythematosus, unspecified: Secondary | ICD-10-CM | POA: Diagnosis not present

## 2022-06-01 DIAGNOSIS — R55 Syncope and collapse: Secondary | ICD-10-CM

## 2022-06-01 DIAGNOSIS — G250 Essential tremor: Secondary | ICD-10-CM

## 2022-06-02 ENCOUNTER — Telehealth: Payer: Self-pay | Admitting: Neurology

## 2022-06-02 NOTE — Telephone Encounter (Signed)
[employer] sent to GI they obtain auth 

## 2022-06-04 ENCOUNTER — Other Ambulatory Visit: Payer: Self-pay | Admitting: Family Medicine

## 2022-06-04 DIAGNOSIS — F32A Depression, unspecified: Secondary | ICD-10-CM

## 2022-06-04 NOTE — Telephone Encounter (Signed)
Requested Prescriptions  Pending Prescriptions Disp Refills  . DULoxetine (CYMBALTA) 60 MG capsule [Pharmacy Med Name: DULOXETINE CAP 60MG DR] 90 capsule 0    Sig: TAKE 1 CAPSULE DAILY     Psychiatry: Antidepressants - SNRI - duloxetine Failed - 06/04/2022  4:43 AM      Failed - Cr in normal range and within 360 days    Creat  Date Value Ref Range Status  05/04/2017 0.89 0.50 - 1.05 mg/dL Final    Comment:      For patients > or = 55 years of age: The upper reference limit for Creatinine is approximately 13% higher for people identified as African-American.      Creatinine, Ser  Date Value Ref Range Status  01/21/2020 0.81 0.44 - 1.00 mg/dL Final   Creatinine, Urine  Date Value Ref Range Status  01/21/2020 44 mg/dL Final         Failed - eGFR is 30 or above and within 360 days    GFR, Est African American  Date Value Ref Range Status  05/04/2017 87 >=60 mL/min Final   GFR calc Af Amer  Date Value Ref Range Status  01/21/2020 >60 >60 mL/min Final   GFR, Est Non African American  Date Value Ref Range Status  05/04/2017 76 >=60 mL/min Final   GFR calc non Af Amer  Date Value Ref Range Status  01/21/2020 >60 >60 mL/min Final   GFR  Date Value Ref Range Status  11/11/2015 70.61 >60.00 mL/min Final         Passed - Completed PHQ-2 or PHQ-9 in the last 360 days      Passed - Last BP in normal range    BP Readings from Last 1 Encounters:  06/01/22 107/69         Passed - Valid encounter within last 6 months    Recent Outpatient Visits          1 month ago Vasovagal episode   [place], DO   8 months ago Moderate persistent asthma with acute exacerbation   [place], DO   8 months ago Moderate persistent asthma with acute exacerbation   [place], DO   8 months ago Moderate persistent asthma with acute exacerbation   [facility] Olin Hauser, DO   11 months ago Annual physical exam   [place], Devonne Doughty, DO

## 2022-06-05 ENCOUNTER — Ambulatory Visit
Admission: RE | Admit: 2022-06-05 | Discharge: 2022-06-05 | Disposition: A | Discharge status: Home / Self Care | Payer: No Typology Code available for payment source | Source: Ambulatory Visit | Attending: Emergency Medicine | Admitting: Emergency Medicine

## 2022-06-05 VITALS — BP 115/73 | HR 79 | Temp 98.0°F | Resp 18

## 2022-06-05 DIAGNOSIS — S61239A Puncture wound without foreign body of unspecified finger without damage to nail, initial encounter: Secondary | ICD-10-CM

## 2022-06-05 DIAGNOSIS — S61230A Puncture wound without foreign body of right index finger without damage to nail, initial encounter: Secondary | ICD-10-CM | POA: Diagnosis not present

## 2022-06-05 DIAGNOSIS — L03011 Cellulitis of right finger: Secondary | ICD-10-CM | POA: Diagnosis not present

## 2022-06-05 MED ORDER — DOXYCYCLINE HYCLATE 100 MG PO CAPS: 100.0000 mg | ORAL_CAPSULE | Freq: Two times a day (BID) | ORAL | 0 refills | Status: AC

## 2022-06-05 NOTE — ED Triage Notes (Signed)
Patient presents to Urgent Care with complaints of a right index finger and 2nd digit finger injury x 12 days. Treating with epsom salt and tylenol.

## 2022-06-05 NOTE — ED Provider Notes (Signed)
Christina Meza    CSN: [id number] Arrival date & time: 06/05/22  1203      History   Chief Complaint Chief Complaint  Patient presents with   Finger Injury    Infected fingers. Picked them due to anxiety - Entered by patient    HPI Christina Meza is a 56 y.o. female.  Patient presents with pain, swelling, redness of her right index and ring fingers.  The injury to her right index finger occurred when she was reaching for a fork in the dishwasher and one of the fork tines punctured under her fingernail.  The pain became worse during the night last night so she cut the nail and had purulent drainage.  The right ring finger had a blood blister which has drained.  She denies fever, chills, numbness, weakness, or other symptoms.  Treatment at home with Epsom salt soaks and Tylenol.  Her medical history includes lupus, Raynaud's disease, asthma, arthritis, migraine headaches, palpitations, systolic dysfunction, prolonged QT interval, GERD, depression, anxiety disorder.  The history is provided by the patient and medical records.    Past Medical History:  Diagnosis Date   Allergy    Arthritis    Asthma    Clostridium difficile infection    2015x2 , 2016x1   Depression    Diastolic dysfunction    a. TTE 1/18: EF 50-55%, no RWMA, Gr1DD, mildly dilated aortic root of 3.4 cm, mildly dilated ascending aorta of 3.7 cm, mild mitral regurgitation, normal size left atrium, normal RV systolic function, PASP normal   Genital warts    GERD (gastroesophageal reflux disease)    Inappropriate sinus tachycardia    a. 2010 Nuc Stress test: no ischemia/infarct;  b. 10/2013 Echo: Nl LV fxn, mild MR/TR.   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   Neck pain 06/18/2016   Bilateral wrist pain 05/24/2016   Long term current use of systemic steroids 12/16/2015   Raynaud's disease without gangrene 12/16/2015   Rheumatoid factor positive 12/16/2015   Inappropriate sinus tachycardia 11/17/2015   Sinus tachycardia 11/12/2015   SLE (systemic lupus erythematosus) (HCC) 11/11/2015   Preventative health care 11/11/2015   Depression, major, recurrent, moderate (HCC) 11/11/2015   Moderate persistent asthma 11/11/2015   GERD (gastroesophageal reflux disease) 11/11/2015   Insomnia 11/11/2015    Past Surgical History:  Procedure Laterality Date   BREAST BIOPSY Right 03/01/2017   u/s core bx neg ADENOSIS AND FOCAL MICROCYST FORMATION.   BREAST CYST ASPIRATION Left 4-5 yrs ago   NEG   CATARACT EXTRACTION  2008   ENDOSCOPIC VEIN LASER TREATMENT     ESOPHAGOGASTRODUODENOSCOPY N/A 07/31/2021   Procedure: ESOPHAGOGASTRODUODENOSCOPY (EGD);  Surgeon: [place], Boykin Nearing, MD;  Location: [facility] ENDOSCOPY;  Service: Gastroenterology;  Laterality: N/A;   UMBILICAL HERNIA REPAIR     vulvar excision for HPV  07/04/2014    OB History   No obstetric history on file.      Home Medications    Prior to Admission medications   Medication Sig Start Date End Date Taking? Authorizing  Provider  doxycycline (VIBRAMYCIN) 100 MG capsule Take 1 capsule (100 mg total) by mouth 2 (two) times daily for 7 days. 06/05/22 06/12/22 Yes Mickie Bail, NP  albuterol (VENTOLIN HFA) 108 (90 Base) MCG/ACT inhaler TAKE 2 PUFFS BY MOUTH EVERY 6 HOURS AS NEEDED FOR WHEEZE OR SHORTNESS OF BREATH 02/10/22   Karamalegos, Netta Neat, DO  Belimumab 200 MG/ML SOAJ Inject 200 mg into the skin. 01/25/17   [provider]  Biotin 5000 MCG TABS Take by mouth.    [provider]  Cholecalciferol (VITAMIN D3) 3000 units TABS Take by  mouth daily.    [provider]  diclofenac sodium (VOLTAREN) 1 % GEL Apply 1 application topically as needed.    [provider]  DULoxetine (CYMBALTA) 60 MG capsule TAKE 1 CAPSULE DAILY 06/04/22   Karamalegos, Netta Neat, DO  fluconazole (DIFLUCAN) 150 MG tablet TAKE ONE TABLET BY MOUTH ON DAY 1. REPEAT DOSE 2ND TABLET ON DAY 3. 12/21/21   Karamalegos, Netta Neat, DO  folic acid (FOLVITE) 1 MG tablet Take 1 mg by mouth daily. 03/10/20   [provider]  hydroxychloroquine (PLAQUENIL) 200 MG tablet Take 200 mg by mouth 2 (two) times daily. Alternating 200 mg once a day and next day twice a day.    [provider]  metoprolol tartrate (LOPRESSOR) 25 MG tablet Take 1 tablet (25 mg) by mouth twice daily 07/23/21   Duke Salvia, MD  Multiple Vitamins-Minerals (MULTIVITAMIN WITH MINERALS) tablet Take 1 tablet by mouth every morning.    [provider]  naproxen sodium (ALEVE) 220 MG tablet Take 1 tablet (220 mg total) by mouth at bedtime. 08/10/16   Karamalegos, Netta Neat, DO  ondansetron (ZOFRAN) 4 MG tablet Take 1 tablet (4 mg total) by mouth every 8 (eight) hours as needed for nausea or vomiting. 05/21/16   Tommie Sams, DO  pantoprazole (PROTONIX) 40 MG tablet TAKE 1 TABLET BY MOUTH EVERY DAY 03/23/21   Althea Charon, Netta Neat, DO  Probiotic Product (PROBIOTIC DAILY PO) Take 1 tablet by mouth daily.     [provider]  SYMBICORT 160-4.5 MCG/ACT inhaler Inhale 2 puffs into the lungs in the morning and at bedtime. 10/06/21   Karamalegos, Netta Neat, DO  traZODone (DESYREL) 50 MG tablet TAKE 1 TABLET BY MOUTH EVERYDAY AT BEDTIME 08/11/21   Karamalegos, Netta Neat, DO  triamcinolone cream (KENALOG) 0.5 % Apply 1 application topically 2 (two) times daily. To affected areas, for up to 2 weeks. 03/16/17   Karamalegos, Netta Neat, DO  valACYclovir (VALTREX) 500 MG tablet Take 500 mg by mouth at bedtime.    [provider]  verapamil (CALAN) 40 MG  tablet Take one tablet every 8 hours as needed for Raynaud's 04/24/18   Duke Salvia, MD    Family History Family History  Problem Relation Age of Onset   Arthritis Mother    Bipolar disorder Mother    Depression Mother    Mental illness Mother    Hypertension Maternal Grandmother    Hyperlipidemia Maternal Grandfather    Heart disease Maternal Grandfather    Stroke Maternal Grandfather    Hypertension Maternal Grandfather    Colon cancer Paternal Grandfather    Breast cancer Neg Hx     Social History Social History   Tobacco Use   Smoking status: Never   Smokeless tobacco: Never  Vaping Use   Vaping Use: Never used  Substance Use Topics   Alcohol use: Yes  Alcohol/week: 0.0 - 1.0 standard drinks of alcohol   Drug use: No     Allergies   Lansoprazole, Celebrex [celecoxib], Sulfa antibiotics, Sulfamethoxazole-trimethoprim, and Furosemide   Review of Systems Review of Systems  Constitutional:  Negative for chills and fever.  Skin:  Positive for color change and wound.  Neurological:  Negative for weakness and numbness.  All other systems reviewed and are negative.    Physical Exam Triage Vital Signs ED Triage Vitals  Enc Vitals Group     BP      Pulse      Resp      Temp      Temp src      SpO2      Weight      Height      Head Circumference      Peak Flow      Pain Score      Pain Loc      Pain Edu?      Excl. in GC?    No data found.  Updated Vital Signs BP 115/73   Pulse 79   Temp 98 F (36.7 C)   Resp 18   LMP 07/15/2015 (Within Days)   SpO2 97%   Visual Acuity Right Eye Distance:   Left Eye Distance:   Bilateral Distance:    Right Eye Near:   Left Eye Near:    Bilateral Near:     Physical Exam Vitals and nursing note reviewed.  Constitutional:      General: She is not in acute distress.    Appearance: Normal appearance. She is well-developed. She is not ill-appearing.  HENT:     Mouth/Throat:     Mouth: Mucous  membranes are moist.  Cardiovascular:     Rate and Rhythm: Normal rate and regular rhythm.  Pulmonary:     Effort: Pulmonary effort is normal. No respiratory distress.  Musculoskeletal:        General: Swelling and tenderness present. No deformity. Normal range of motion.     Cervical back: Neck supple.  Skin:    General: Skin is warm and dry.     Capillary Refill: Capillary refill takes less than 2 seconds.     Findings: Erythema and lesion present.     Comments: Right index finger tender at tip of finger with edema and mild erythema.  Right ring finger has area of dried blood beside fingernail with edema and mild erythema.  See pictures.    Neurological:     General: No focal deficit present.     Mental Status: She is alert and oriented to person, place, and time.     Sensory: No sensory deficit.     Motor: No weakness.  Psychiatric:        Mood and Affect: Mood normal.        Behavior: Behavior normal.           UC Treatments / Results  Labs (all labs ordered are listed, but only abnormal results are displayed) Labs Reviewed - No data to display  EKG   Radiology No results found.  Procedures Procedures (including critical care time)  Medications Ordered in UC Medications - No data to display  Initial Impression / Assessment and Plan / UC Course  I have reviewed the triage vital signs and the nursing notes.  Pertinent labs & imaging results that were available during my care of the patient were reviewed by me and considered in my medical decision  making (see chart for details).    Puncture wound of right index finger under the fingernail.  Cellulitis of right index and ring fingers.  Wound care instructions and signs of worsening infection discussed.  Education provided on puncture wounds and cellulitis.  Treating with doxycycline.  Instructed patient to follow-up with her PCP on Monday.  She agrees to plan of care.  Final Clinical Impressions(s) / UC  Diagnoses   Final diagnoses:  Puncture wound of finger of right hand, initial encounter  Cellulitis of finger of right hand     Discharge Instructions      Take the doxycycline as directed.  Keep your wounds clean and dry.  Wash them gently twice a day with soap and water.  Apply an antibiotic cream twice a day.    Follow up with your primary care provider on Monday.          ED Prescriptions     Medication Sig Dispense Auth. Provider   doxycycline (VIBRAMYCIN) 100 MG capsule Take 1 capsule (100 mg total) by mouth 2 (two) times daily for 7 days. 14 capsule Mickie Bail, NP      PDMP not reviewed this encounter.   Mickie Bail, NP 06/05/22 1240

## 2022-06-05 NOTE — Discharge Instructions (Addendum)
Take the doxycycline as directed.  Keep your wounds clean and dry.  Wash them gently twice a day with soap and water.  Apply an antibiotic cream twice a day.    Follow up with your primary care provider on Monday.

## 2022-07-02 ENCOUNTER — Ambulatory Visit
Admission: RE | Admit: 2022-07-02 | Discharge: 2022-07-02 | Disposition: A | Discharge status: Home / Self Care | Payer: No Typology Code available for payment source | Source: Ambulatory Visit | Attending: Family Medicine | Admitting: Family Medicine

## 2022-07-02 DIAGNOSIS — Z1231 Encounter for screening mammogram for malignant neoplasm of breast: Secondary | ICD-10-CM | POA: Diagnosis present

## 2022-07-07 ENCOUNTER — Ambulatory Visit
Admission: RE | Admit: 2022-07-07 | Discharge: 2022-07-07 | Disposition: A | Discharge status: Home / Self Care | Payer: No Typology Code available for payment source | Source: Ambulatory Visit | Attending: Neurology | Admitting: Neurology

## 2022-07-07 DIAGNOSIS — R404 Transient alteration of awareness: Secondary | ICD-10-CM | POA: Diagnosis not present

## 2022-07-07 DIAGNOSIS — R55 Syncope and collapse: Secondary | ICD-10-CM

## 2022-07-07 DIAGNOSIS — M329 Systemic lupus erythematosus, unspecified: Secondary | ICD-10-CM

## 2022-07-07 MED ORDER — GADOBENATE DIMEGLUMINE 529 MG/ML IV SOLN
12.0000 mL | Freq: Once | INTRAVENOUS | Status: AC | PRN
  Administered 2022-07-07: 12 mL via INTRAVENOUS

## 2022-08-09 ENCOUNTER — Ambulatory Visit: Payer: No Typology Code available for payment source

## 2022-08-26 ENCOUNTER — Other Ambulatory Visit: Payer: Self-pay | Admitting: Family Medicine

## 2022-08-26 DIAGNOSIS — F32A Depression, unspecified: Secondary | ICD-10-CM

## 2022-08-26 NOTE — Telephone Encounter (Signed)
Requested medication (s) are due for refill today - yes  Requested medication (s) are on the active medication list -yes  Future visit scheduled -no  Last refill: 06/04/22 #90  Notes to clinic: fails lab protocol- over 1 year-2021  Requested Prescriptions  Pending Prescriptions Disp Refills   DULoxetine (CYMBALTA) 60 MG capsule [Pharmacy Med Name: DULOXETINE CAP 60MG DR] 90 capsule 0    Sig: TAKE 1 CAPSULE DAILY     Psychiatry: Antidepressants - SNRI - duloxetine Failed - 08/26/2022  2:12 AM      Failed - Cr in normal range and within 360 days    Creat  Date Value Ref Range Status  05/04/2017 0.89 0.50 - 1.05 mg/dL Final    Comment:      For patients > or = 56 years of age: The upper reference limit for Creatinine is approximately 13% higher for people identified as African-American.      Creatinine, Ser  Date Value Ref Range Status  01/21/2020 0.81 0.44 - 1.00 mg/dL Final   Creatinine, Urine  Date Value Ref Range Status  01/21/2020 44 mg/dL Final         Failed - eGFR is 30 or above and within 360 days    GFR, Est African American  Date Value Ref Range Status  05/04/2017 87 >=60 mL/min Final   GFR calc Af Amer  Date Value Ref Range Status  01/21/2020 >60 >60 mL/min Final   GFR, Est Non African American  Date Value Ref Range Status  05/04/2017 76 >=60 mL/min Final   GFR calc non Af Amer  Date Value Ref Range Status  01/21/2020 >60 >60 mL/min Final   GFR  Date Value Ref Range Status  11/11/2015 70.61 >60.00 mL/min Final         Passed - Completed PHQ-2 or PHQ-9 in the last 360 days      Passed - Last BP in normal range    BP Readings from Last 1 Encounters:  06/05/22 115/73         Passed - Valid encounter within last 6 months    Recent Outpatient Visits           4 months ago Vasovagal episode   [place], DO   10 months ago Moderate persistent asthma with acute exacerbation   [facility] Olin Hauser, DO   11 months ago Moderate persistent asthma with acute exacerbation   [facility] Olin Hauser, DO   11 months ago Moderate persistent asthma with acute exacerbation   [place], DO   1 year ago Annual physical exam   [facility] Olin Hauser, DO                 Requested Prescriptions  Pending Prescriptions Disp Refills   DULoxetine (CYMBALTA) 60 MG capsule [Pharmacy Med Name: DULOXETINE CAP 60MG DR] 90 capsule 0    Sig: TAKE 1 CAPSULE DAILY     Psychiatry: Antidepressants - SNRI - duloxetine Failed - 08/26/2022  2:12 AM      Failed - Cr in normal range and within 360 days    Creat  Date Value Ref Range Status  05/04/2017 0.89 0.50 - 1.05 mg/dL Final    Comment:      For patients > or = 56 years of age: The upper reference limit for Creatinine is approximately 13% higher for people  identified as African-American.      Creatinine, Ser  Date Value Ref Range Status  01/21/2020 0.81 0.44 - 1.00 mg/dL Final   Creatinine, Urine  Date Value Ref Range Status  01/21/2020 44 mg/dL Final         Failed - eGFR is 30 or above and within 360 days    GFR, Est African American  Date Value Ref Range Status  05/04/2017 87 >=60 mL/min Final   GFR calc Af Amer  Date Value Ref Range Status  01/21/2020 >60 >60 mL/min Final   GFR, Est Non African American  Date Value Ref Range Status  05/04/2017 76 >=60 mL/min Final   GFR calc non Af Amer  Date Value Ref Range Status  01/21/2020 >60 >60 mL/min Final   GFR  Date Value Ref Range Status  11/11/2015 70.61 >60.00 mL/min Final         Passed - Completed PHQ-2 or PHQ-9 in the last 360 days      Passed - Last BP in normal range    BP Readings from Last 1 Encounters:  06/05/22 115/73         Passed - Valid encounter within last 6 months    Recent Outpatient Visits           4 months ago  Vasovagal episode   [place], DO   10 months ago Moderate persistent asthma with acute exacerbation   [place], DO   11 months ago Moderate persistent asthma with acute exacerbation   [facility] Olin Hauser, DO   11 months ago Moderate persistent asthma with acute exacerbation   [facility] Olin Hauser, DO   1 year ago Annual physical exam   [place], Devonne Doughty, DO

## 2022-08-30 ENCOUNTER — Other Ambulatory Visit: Payer: Self-pay | Admitting: Family Medicine

## 2022-08-30 DIAGNOSIS — G4709 Other insomnia: Secondary | ICD-10-CM

## 2022-08-30 NOTE — Telephone Encounter (Signed)
Requested Prescriptions  Pending Prescriptions Disp Refills  . traZODone (DESYREL) 50 MG tablet [Pharmacy Med Name: TRAZODONE 50 MG TABLET] 90 tablet 0    Sig: TAKE 1 TABLET BY MOUTH EVERYDAY AT BEDTIME     Psychiatry: Antidepressants - Serotonin Modulator Passed - 08/30/2022  2:23 AM      Passed - Completed PHQ-2 or PHQ-9 in the last 360 days      Passed - Valid encounter within last 6 months    Recent Outpatient Visits          4 months ago Vasovagal episode   [place], DO   10 months ago Moderate persistent asthma with acute exacerbation   [place], DO   11 months ago Moderate persistent asthma with acute exacerbation   [place], DO   11 months ago Moderate persistent asthma with acute exacerbation   [place], DO   1 year ago Annual physical exam   [place], Devonne Doughty, DO

## 2022-12-03 ENCOUNTER — Other Ambulatory Visit: Payer: Self-pay | Admitting: Family Medicine

## 2022-12-03 DIAGNOSIS — G4709 Other insomnia: Secondary | ICD-10-CM

## 2022-12-04 NOTE — Telephone Encounter (Signed)
Patient called, left VM to return the call to the office for f/u visit.

## 2022-12-04 NOTE — Telephone Encounter (Signed)
Courtesy refill given, appointment needed.   Requested Prescriptions  Pending Prescriptions Disp Refills   traZODone (DESYREL) 50 MG tablet [Pharmacy Med Name: TRAZODONE 50 MG TABLET] 30 tablet 0    Sig: Take 1 tablet (50 mg total) by mouth at bedtime. OFFICE VISIT NEEDED FOR ADDITIONAL REFILLS     Psychiatry: Antidepressants - Serotonin Modulator Failed - 12/03/2022  2:05 AM      Failed - Valid encounter within last 6 months    Recent Outpatient Visits           7 months ago Vasovagal episode   [facility] [place], Netta Neat, DO   1 year ago Moderate persistent asthma with acute exacerbation   [facility] [place], Netta Neat, DO   1 year ago Moderate persistent asthma with acute exacerbation   [facility] Smitty Cords, DO   1 year ago Moderate persistent asthma with acute exacerbation   [facility] Smitty Cords, DO   1 year ago Annual physical exam   [facility] [place], Netta Neat, DO              Passed - Completed PHQ-2 or PHQ-9 in the last 360 days

## 2022-12-18 ENCOUNTER — Other Ambulatory Visit: Payer: Self-pay | Admitting: Family Medicine

## 2022-12-18 DIAGNOSIS — G4709 Other insomnia: Secondary | ICD-10-CM

## 2022-12-20 NOTE — Telephone Encounter (Signed)
Called pt, LVMTCB to schedule Annual exam for medication refills. Last annual was 06/2021.

## 2022-12-20 NOTE — Telephone Encounter (Signed)
Requested medication (s) are due for refill today: yes  Requested medication (s) are on the active medication list: yes  Last refill:  12/04/22 #30/0  Future visit scheduled: no  Notes to clinic:  pt is due for OV. Already been given courtesy refill. Called pt, LVMTCB     Requested Prescriptions  Pending Prescriptions Disp Refills   traZODone (DESYREL) 50 MG tablet [Pharmacy Med Name: TRAZODONE 50 MG TABLET] 90 tablet 1    Sig: Take 1 tablet (50 mg total) by mouth at bedtime. OFFICE VISIT NEEDED FOR ADDITIONAL REFILLS     Psychiatry: Antidepressants - Serotonin Modulator Failed - 12/18/2022 10:10 AM      Failed - Valid encounter within last 6 months    Recent Outpatient Visits           8 months ago Vasovagal episode   [place], DO   1 year ago Moderate persistent asthma with acute exacerbation   [place], DO   1 year ago Moderate persistent asthma with acute exacerbation   [place], DO   1 year ago Moderate persistent asthma with acute exacerbation   [place], DO   1 year ago Annual physical exam   [place], DO       Future Appointments             In 3 days [place], Devonne Doughty, DO [facility] [place] - Completed PHQ-2 or PHQ-9 in the last 360 days

## 2022-12-23 ENCOUNTER — Encounter: Payer: Self-pay | Admitting: Family Medicine

## 2022-12-23 ENCOUNTER — Ambulatory Visit (INDEPENDENT_AMBULATORY_CARE_PROVIDER_SITE_OTHER): Payer: No Typology Code available for payment source | Admitting: Family Medicine

## 2022-12-23 VITALS — BP 112/70 | HR 78 | Ht 64.0 in | Wt 136.0 lb

## 2022-12-23 DIAGNOSIS — I7300 Raynaud's syndrome without gangrene: Secondary | ICD-10-CM

## 2022-12-23 DIAGNOSIS — G252 Other specified forms of tremor: Secondary | ICD-10-CM

## 2022-12-23 DIAGNOSIS — F331 Major depressive disorder, recurrent, moderate: Secondary | ICD-10-CM

## 2022-12-23 DIAGNOSIS — J4540 Moderate persistent asthma, uncomplicated: Secondary | ICD-10-CM

## 2022-12-23 DIAGNOSIS — M328 Other forms of systemic lupus erythematosus: Secondary | ICD-10-CM | POA: Diagnosis not present

## 2022-12-23 DIAGNOSIS — G2581 Restless legs syndrome: Secondary | ICD-10-CM

## 2022-12-23 DIAGNOSIS — R200 Anesthesia of skin: Secondary | ICD-10-CM

## 2022-12-23 DIAGNOSIS — F411 Generalized anxiety disorder: Secondary | ICD-10-CM

## 2022-12-23 DIAGNOSIS — S60419A Abrasion of unspecified finger, initial encounter: Secondary | ICD-10-CM

## 2022-12-23 MED ORDER — MUPIROCIN 2 % EX OINT: 1.0000 | TOPICAL_OINTMENT | Freq: Two times a day (BID) | CUTANEOUS | 0 refills | Status: DC

## 2022-12-23 MED ORDER — ROPINIROLE HCL 1 MG PO TABS: 1.0000 mg | ORAL_TABLET | Freq: Every day | ORAL | 1 refills | Status: DC

## 2022-12-23 NOTE — Patient Instructions (Addendum)
Thank you for coming to the office today.  Summer for [employer] / Yearly  Referral sent back to [facility] Neurology Dr Alric Ran MD 1) intention tremor 2) numbness / neuropathy / RLS  Trial on Ropinirole Reqiup for RLS - start half tab 0.5mg  nightly 1-2 weeks, if need take 1 tab whole dose, then eventually consider 2mg  if need if helpful for the nerve symptoms.  Alpha Lipoic Acid - nerve supplement vitamin - usually 2 times a day 600-800 mg dose approximately or whatever is available OTC  Voltaren for rib pain, consider x-ray if need  Mupirocin antibiotic ointment for finger sores. Hold off on soaking now.   Please schedule a Follow-up Appointment to: Return in about 6 months (around 06/23/2023) for 6 month Annual Physical AM apt fasting lab AFTER / FMLA update?.  If you have any other questions or concerns, please feel free to call the office or send a message through [facility]. You may also schedule an earlier appointment if necessary.  Additionally, you may be receiving a survey about your experience at our office within a few days to 1 week by e-mail or mail. We value your feedback.  Nobie Putnam, DO [place]

## 2022-12-23 NOTE — Progress Notes (Signed)
Subjective:    Patient ID: Christina Meza, female    DOB: 31-Mar-1966, 57 y.o.   MRN: [id number]  Christina Meza is a 57 y.o. female presenting on 12/23/2022 for Fall and Tremors   HPI  Anxiety / Chronic Depression recurrent, moderate Insomnia On SNRI Duloxetine with improvement. Still has anxiety and insomnia however   SLE / Lupus Followed by [facility] Rheumatology - Dr Oretha Ellis On medication management.  Intention Tremor Need consult referral back to Neurologist at [facility] Intention Tremor - notice more at work when doing fine motor tasks at work and applying lipstick. Not noticed with heavy lifting objects and no resting tremor.  Feet Tingling / Numbness Episodic flares, worse at end of day, can have sharp pain starts in toes, lasts 1-2 min and can radiate up to mid calf. She notices toe tingling when trying to go to sleep. Difficulty falling asleep  RLS symptoms occasional. Mother has RLS. She tried Magnesium.   Syndrome of Inappropriate Tachycardia Currently managed on medication BB  Finger sores. Skin sores. Healing now.  Fall x 2 recently fell R flank No bruising. Pain over area, but not impacting her movements. Asking about if need x-ray       12/23/2022   10:47 AM 04/21/2022    9:34 AM 06/17/2021    1:29 PM  Depression screen PHQ 2/9  Decreased Interest 1 0 2  Down, Depressed, Hopeless 1 0 2  PHQ - 2 Score 2 0 4  Altered sleeping 1 1 2   Tired, decreased energy 2 3 2   Change in appetite 1 1 3   Feeling bad or failure about yourself  0 0 1  Trouble concentrating 0 1 2  Moving slowly or fidgety/restless 0 0 0  Suicidal thoughts 0 0 0  PHQ-9 Score 6 6 14   Difficult doing work/chores Not difficult at all Not difficult at all Somewhat difficult    Past Medical History:  Diagnosis Date   Allergy    Arthritis    Asthma    Clostridium difficile infection    2015x2 , 2016x1   Depression    Diastolic dysfunction    a. TTE 1/18: EF 50-55%, no RWMA,  Gr1DD, mildly dilated aortic root of 3.4 cm, mildly dilated ascending aorta of 3.7 cm, mild mitral regurgitation, normal size left atrium, normal RV systolic function, PASP normal   Genital warts    GERD (gastroesophageal reflux disease)    Inappropriate sinus tachycardia    a. 2010 Nuc Stress test: no ischemia/infarct;  b. 10/2013 Echo: Nl LV fxn, mild MR/TR.   Migraine    Palpitations    Prolonged QT interval    SLE (systemic lupus erythematosus) (HCC)    Stomach ulcer    Past Surgical History:  Procedure Laterality Date   BREAST BIOPSY Right 03/01/2017   u/s core bx neg ADENOSIS AND FOCAL MICROCYST FORMATION.   BREAST CYST ASPIRATION Left 4-5 yrs ago   NEG   CATARACT EXTRACTION  2008   ENDOSCOPIC VEIN LASER TREATMENT     ESOPHAGOGASTRODUODENOSCOPY N/A 07/31/2021   Procedure: ESOPHAGOGASTRODUODENOSCOPY (EGD);  Surgeon: Toledo, 11/2013, MD;  Location: [facility] ENDOSCOPY;  Service: Gastroenterology;  Laterality: N/A;   UMBILICAL HERNIA REPAIR     vulvar excision for HPV  07/04/2014   Social History   Socioeconomic History   Marital status: Married    Spouse name: Not on file   Number of children: Not on file   Years of education: Not on file   Highest  education level: Not on file  Occupational History   Not on file  Tobacco Use   Smoking status: Never   Smokeless tobacco: Never  Vaping Use   Vaping Use: Never used  Substance and Sexual Activity   Alcohol use: Yes    Alcohol/week: 0.0 - 1.0 standard drinks of alcohol   Drug use: No   Sexual activity: Yes    Partners: Male  Other Topics Concern   Not on file  Social History Narrative   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   Social Determinants of Health   Financial Resource Strain: Not on file  Food Insecurity: Not on file  Transportation Needs: Not on file  Physical Activity: Not on file  Stress: Not on file  Social Connections: Not on file  Intimate Partner Violence: Not on file   Family History  Problem Relation Age of Onset    Arthritis Mother    Bipolar disorder Mother    Depression Mother    Mental illness Mother    Hypertension Maternal Grandmother    Hyperlipidemia Maternal Grandfather    Heart disease Maternal Grandfather    Stroke Maternal Grandfather    Hypertension Maternal Grandfather    Colon cancer Paternal Grandfather    Breast cancer Neg Hx    Current Outpatient Medications on File Prior to Visit  Medication Sig   albuterol (VENTOLIN HFA) 108 (90 Base) MCG/ACT inhaler TAKE 2 PUFFS BY MOUTH EVERY 6 HOURS AS NEEDED FOR WHEEZE OR SHORTNESS OF BREATH   Belimumab 200 MG/ML SOAJ Inject 200 mg into the skin.   Biotin 5000 MCG TABS Take by mouth.   Cholecalciferol (VITAMIN D3) 3000 units TABS Take by mouth daily.   diclofenac sodium (VOLTAREN) 1 % GEL Apply 1 application topically as needed.   DULoxetine (CYMBALTA) 60 MG capsule TAKE 1 CAPSULE DAILY   estradiol (CLIMARA - DOSED IN MG/24 HR) 0.025 mg/24hr patch APPLY 1 PATCH ON THE SKIN  ONCE A WEEK   fluconazole (DIFLUCAN) 150 MG tablet TAKE ONE TABLET BY MOUTH ON DAY 1. REPEAT DOSE 2ND TABLET ON DAY 3.   folic acid (FOLVITE) 1 MG tablet Take 1 mg by mouth daily.   hydroxychloroquine (PLAQUENIL) 200 MG tablet Take 200 mg by mouth 2 (two) times daily. Alternating 200 mg once a day and next day twice a day.   metoprolol tartrate (LOPRESSOR) 25 MG tablet Take 1 tablet (25 mg) by mouth twice daily   Multiple Vitamins-Minerals (MULTIVITAMIN WITH MINERALS) tablet Take 1 tablet by mouth every morning.   naproxen sodium (ALEVE) 220 MG tablet Take 1 tablet (220 mg total) by mouth at bedtime.   ondansetron (ZOFRAN) 4 MG tablet Take 1 tablet (4 mg total) by mouth every 8 (eight) hours as needed for nausea or vomiting.   pantoprazole (PROTONIX) 40 MG tablet TAKE 1 TABLET BY MOUTH EVERY DAY   Probiotic Product (PROBIOTIC DAILY PO) Take 1 tablet by mouth daily.    progesterone (PROMETRIUM) 100 MG capsule Take 100 mg by mouth at bedtime.   SYMBICORT 160-4.5 MCG/ACT  inhaler Inhale 2 puffs into the lungs in the morning and at bedtime.   traZODone (DESYREL) 50 MG tablet Take 1 tablet (50 mg total) by mouth at bedtime.   triamcinolone cream (KENALOG) 0.5 % Apply 1 application topically 2 (two) times daily. To affected areas, for up to 2 weeks.   valACYclovir (VALTREX) 500 MG tablet Take 500 mg by mouth at bedtime.   verapamil (CALAN) 40 MG tablet Take one tablet  every 8 hours as needed for Raynaud's   No current facility-administered medications on file prior to visit.    Review of Systems Per HPI unless specifically indicated above      Objective:    BP 112/70   Pulse 78   Ht 5\' 4"  (1.626 m)   Wt 136 lb (61.7 kg)   LMP 07/15/2015 (Within Days)   SpO2 95%   BMI 23.34 kg/m   Wt Readings from Last 3 Encounters:  12/23/22 136 lb (61.7 kg)  06/01/22 135 lb (61.2 kg)  04/21/22 134 lb 9.6 oz (61.1 kg)    Physical Exam Vitals and nursing note reviewed.  Constitutional:      General: She is not in acute distress.    Appearance: Normal appearance. She is well-developed. She is not diaphoretic.     Comments: Well-appearing, comfortable, cooperative  HENT:     Head: Normocephalic and atraumatic.  Eyes:     General:        Right eye: No discharge.        Left eye: No discharge.     Conjunctiva/sclera: Conjunctivae normal.  Cardiovascular:     Rate and Rhythm: Normal rate.  Pulmonary:     Effort: Pulmonary effort is normal.  Musculoskeletal:     Comments: Localized tenderness R lower rib cage cartilage  Skin:    General: Skin is warm and dry.     Findings: Lesion (fingertips x 2 R and left hand index with abrasion now has scab healing tissue.) present. No erythema or rash.  Neurological:     Mental Status: She is alert and oriented to person, place, and time.  Psychiatric:        Mood and Affect: Mood normal.        Behavior: Behavior normal.        Thought Content: Thought content normal.     Comments: Well groomed, good eye contact,  normal speech and thoughts      Study Result  [facility] [address] [place], [place] [address] [phone]  NEUROIMAGING REPORT   STUDY DATE: 07/07/2022 PATIENT NAME: Thera Basden DOB: Jan 27, 1966 MRN: [id number]  ORDERING CLINICIAN: Dr. April Manson CLINICAL HISTORY: 57 year old patient being evaluated for episode of altered sensorium possible TIA COMPARISON FILMS: None available EXAM: MRI brain with and without contrast TECHNIQUE: Sagittal T1, FLAIR, axial T1, T2, FLAIR, DWI, ADC map, SWI, coronal T2 and postcontrast axial and coronal T1 images were obtained through the brain CONTRAST: 12 mL IV MultiHance IMAGING SITE: [facility]  FINDINGS: The brain parenchyma shows scattered bilateral periventricular, subcortical, corpus callosum, brainstem nonspecific white matter hyperintensities on T2/FLAIR which may be seen in a variety of conditions including small vessel disease, vasculitis, demyelinating, autoimmune, infectious conditions. None of these show any postcontrast enhancement. No other structural lesion, tumor or infarct is noted. Diffusion-weighted imaging is negative for acute ischemia. SWI images do not show any hemorrhages. Subarachnoid spaces and ventricular system appear normal. Cortical sulci and gyri show normal appearance. Extra-axial brain structures appear normal. Calvarium shows no abnormalities. Orbits appear unremarkable. Paranasal sinuses show mild chronic inflammatory changes especially in the right maxillary sinus forgives mucous retention cyst/polyp noted. Pituitary gland and cerebellar tonsils appear normal. Visualized portion of the cervical spine shows no abnormalities. Flow-voids of large vessel intracranial circulation appear to be patent. Postcontrast images do not result in abnormal areas of enhancement.     IMPRESSION: MRI scan of the brain with and without contrast showing nonspecific T2/FLAIR  white matter  hyperintensities with the differential discussed above. There are incidental changes of chronic paranasal sinusitis maximal in the right maxillary sinus. No enhancing lesions or acute abnormalities are noted.   INTERPRETING PHYSICIAN: Delia Heady, MD Certified in Neuroimaging by American Society of Neuroimaging and [employer]  Results for orders placed or performed during the hospital encounter of 09/12/21  Covid-19, Flu A+B ([facility])   Specimen: Nasopharyngeal   Naso  Result Value Ref Range   SARS-CoV-2, NAA Not Detected Not Detected   Influenza A, NAA Not Detected Not Detected   Influenza B, NAA Not Detected Not Detected   Test Information: Comment       Assessment & Plan:   Problem List Items Addressed This Visit     Depression, major, recurrent, moderate (HCC)   GAD (generalized anxiety disorder)   Moderate persistent asthma   Raynaud's disease without gangrene   SLE (systemic lupus erythematosus) (HCC)   Other Visit Diagnoses     Intention tremor    -  Primary   Relevant Orders   Ambulatory referral to Neurology   RLS (restless legs syndrome)       Relevant Medications   rOPINIRole (REQUIP) 1 MG tablet   Other Relevant Orders   Ambulatory referral to Neurology   Numbness of toes       Relevant Orders   Ambulatory referral to Neurology   Abrasion of finger, initial encounter       Relevant Medications   mupirocin ointment (BACTROBAN) 2 %       Will be due in Summer for FMLA / Yearly  Referral sent back to [facility] Neurology Dr Windell Norfolk MD 1) intention tremor 2) numbness / neuropathy / RLS  Trial on Ropinirole Reqiup for RLS - start half tab 0.5mg  nightly 1-2 weeks, if need take 1 tab whole dose, then eventually consider 2mg  if need if helpful for the nerve symptoms.  Alpha Lipoic Acid - nerve supplement vitamin - usually 2 times a day 600-800 mg dose approximately or whatever is available OTC  Voltaren for rib pain,  consider x-ray if need  Mupirocin antibiotic ointment for finger sores. Hold off on soaking now.  Orders Placed This Encounter  Procedures   Ambulatory referral to Neurology    Referral Priority:   Routine    Referral Type:   Consultation    Referral Reason:   Specialty Services Required    Requested Specialty:   Neurology    Number of Visits Requested:   1     Meds ordered this encounter  Medications   rOPINIRole (REQUIP) 1 MG tablet    Sig: Take 1 tablet (1 mg total) by mouth at bedtime. May start with half tab 0.5mg  dose and work up to 1mg  nightly. If still ineffective, can double dose to 2mg .    Dispense:  90 tablet    Refill:  1   mupirocin ointment (BACTROBAN) 2 %    Sig: Apply 1 Application topically 2 (two) times daily. For 1-2 weeks    Dispense:  22 g    Refill:  0      Follow up plan: Return in about 6 months (around 06/23/2023) for 6 month Annual Physical AM apt fasting lab AFTER / FMLA update?  , DO [facility] 12/23/2022, 11:00 AM

## 2022-12-31 ENCOUNTER — Telehealth: Payer: Self-pay | Admitting: Internal Medicine

## 2022-12-31 NOTE — Telephone Encounter (Signed)
*  STAT* If patient is at the pharmacy, call can be transferred to refill team.   1. Which medications need to be refilled? (please list name of each medication and dose if known) Metoprolol 25mg   2. Which pharmacy/location (including street and city if local pharmacy) is medication to be sent to? [facility] [address]  3. Do they need a 30 day or 90 day supply? 90day

## 2022-12-31 NOTE — Telephone Encounter (Signed)
Please contact pt for future appointment with Dr. Caryl Comes pt overdue for f/u and hasn't been seen since 2022. Pt requesting refills.

## 2023-01-03 ENCOUNTER — Other Ambulatory Visit: Payer: Self-pay

## 2023-01-03 MED ORDER — METOPROLOL TARTRATE 25 MG PO TABS: ORAL_TABLET | ORAL | 3 refills | Status: DC

## 2023-01-03 NOTE — Telephone Encounter (Signed)
Patient is following up. She states she is now completely out of medication. Patient is not overdue for follow up. Per last office visit with Dr. Caryl Comes (07/2021), patient was advised to follow up in 2 years (07/2023).

## 2023-01-03 NOTE — Telephone Encounter (Signed)
Patient last seen 07/23/2021 next appointment for follow-up to be 07/2023. Patient requesting refill of  metoprolol tartrate (LOPRESSOR) 25 MG tablet. Take 1 tablet (25 mg) by mouth twice daily. Medication refilled and sent to pharmacy of choice.

## 2023-01-26 ENCOUNTER — Telehealth (INDEPENDENT_AMBULATORY_CARE_PROVIDER_SITE_OTHER): Payer: No Typology Code available for payment source | Admitting: Family Medicine

## 2023-01-26 ENCOUNTER — Encounter: Payer: Self-pay | Admitting: Family Medicine

## 2023-01-26 VITALS — Ht 64.0 in | Wt 136.0 lb

## 2023-01-26 DIAGNOSIS — H1032 Unspecified acute conjunctivitis, left eye: Secondary | ICD-10-CM | POA: Diagnosis not present

## 2023-01-26 DIAGNOSIS — J0110 Acute frontal sinusitis, unspecified: Secondary | ICD-10-CM | POA: Diagnosis not present

## 2023-01-26 MED ORDER — ERYTHROMYCIN 5 MG/GM OP OINT: 1.0000 | TOPICAL_OINTMENT | Freq: Four times a day (QID) | OPHTHALMIC | 0 refills | Status: DC

## 2023-01-26 MED ORDER — PREDNISONE 10 MG PO TABS: ORAL_TABLET | ORAL | 0 refills | Status: DC

## 2023-01-26 MED ORDER — AMOXICILLIN-POT CLAVULANATE 875-125 MG PO TABS: 1.0000 | ORAL_TABLET | Freq: Two times a day (BID) | ORAL | 0 refills | Status: DC

## 2023-01-26 NOTE — Progress Notes (Signed)
Subjective:    Patient ID: Christina Meza, female    DOB: 08-12-1966, 57 y.o.   MRN: [id number]  Christina Meza is a 57 y.o. female presenting on 01/26/2023 for Headache and Nasal Congestion  Virtual / Telehealth Encounter - Video Visit via MyChart The purpose of this virtual visit is to provide medical care while limiting exposure to the novel coronavirus (COVID19) for both patient and office staff.  Consent was obtained for remote visit:  Yes.   Answered questions that patient had about telehealth interaction:  Yes.   I discussed the limitations, risks, security and privacy concerns of performing an evaluation and management service by video/telephone. I also discussed with the patient that there may be a patient responsible charge related to this service. The patient expressed understanding and agreed to proceed.  Patient Location: Home Provider Location: [facility] (Office)  Participants in virtual visit: - Patient: Christina Meza - CMA: Orinda Kenner, CMA - Provider: Dr Parks Ranger   HPI  Sinusitis / Conjunctivitis Reports symptoms started after 2/12 with sinus drainage congestion headache, and has now developed Left eye redness drainage swelling. Someone at work had pink eye at work. - Using Ipratropium nasal spray, DayQuil and NyQuil She describes persistent symptoms now but worsening today Admits fatigue and sinus drainage cough Denies fever chills body ache      12/23/2022   10:47 AM 04/21/2022    9:34 AM 06/17/2021    1:29 PM  Depression screen PHQ 2/9  Decreased Interest 1 0 2  Down, Depressed, Hopeless 1 0 2  PHQ - 2 Score 2 0 4  Altered sleeping 1 1 2  $ Tired, decreased energy 2 3 2  $ Change in appetite 1 1 3  $ Feeling bad or failure about yourself  0 0 1  Trouble concentrating 0 1 2  Moving slowly or fidgety/restless 0 0 0  Suicidal thoughts 0 0 0  PHQ-9 Score 6 6 14  $ Difficult doing work/chores Not difficult at all Not  difficult at all Somewhat difficult    Social History   Tobacco Use   Smoking status: Never   Smokeless tobacco: Never  Vaping Use   Vaping Use: Never used  Substance Use Topics   Alcohol use: Yes    Alcohol/week: 0.0 - 1.0 standard drinks of alcohol   Drug use: No    Review of Systems Per HPI unless specifically indicated above     Objective:    Ht 5' 4"$  (1.626 m)   Wt 136 lb (61.7 kg)   LMP 07/15/2015 (Within Days)   BMI 23.34 kg/m   Wt Readings from Last 3 Encounters:  01/26/23 136 lb (61.7 kg)  12/23/22 136 lb (61.7 kg)  06/01/22 135 lb (61.2 kg)    Physical Exam  Note examination was completely remotely via video observation objective data only  Gen - well-appearing, no acute distress or apparent pain, comfortable HEENT - LEFT eyelid upper slightly red swollen, L eye conjunctiva with injection drainage Heart/Lungs - cannot examine virtually - observed no evidence of coughing or labored breathing. Abd - cannot examine virtually  Skin - face visible today- no rash Neuro - awake, alert, oriented Psych - not anxious appearing   Results for orders placed or performed during the hospital encounter of 09/12/21  Covid-19, Flu A+B ([facility])   Specimen: Nasopharyngeal   Naso  Result Value Ref Range   SARS-CoV-2, NAA Not Detected Not Detected   Influenza A, NAA Not Detected Not Detected  Influenza B, NAA Not Detected Not Detected   Test Information: Comment       Assessment & Plan:   Problem List Items Addressed This Visit   None Visit Diagnoses     Acute non-recurrent frontal sinusitis    -  Primary   Relevant Medications   amoxicillin-clavulanate (AUGMENTIN) 875-125 MG tablet   predniSONE (DELTASONE) 10 MG tablet   Acute conjunctivitis of left eye, unspecified acute conjunctivitis type       Relevant Medications   erythromycin ophthalmic ointment       Consistent with acute frontal sinusitis, likely initially viral URI vs allergic rhinitis  component with worsening concern for bacterial infection. Duration >7 days and also conjunctivitis L eye  Plan: 1.  Start Augmentin 875-162m PO BID x 10 days 2. Add prednisone for sinuses with wheezing / respiratory concern as well 3. Erythromycin eye ointment for L eye Return criteria reviewed  Work note   Meds ordered this encounter  Medications   erythromycin ophthalmic ointment    Sig: Place 1 Application into the left eye 4 (four) times daily. For 7-10 days or until infection resolved.    Dispense:  3.5 g    Refill:  0   amoxicillin-clavulanate (AUGMENTIN) 875-125 MG tablet    Sig: Take 1 tablet by mouth 2 (two) times daily.    Dispense:  20 tablet    Refill:  0   predniSONE (DELTASONE) 10 MG tablet    Sig: Take 6 tabs with breakfast Day 1, 5 tabs Day 2, 4 tabs Day 3, 3 tabs Day 4, 2 tabs Day 5, 1 tab Day 6.    Dispense:  21 tablet    Refill:  0      Follow up plan: Return if symptoms worsen or fail to improve.  Patient verbalizes understanding with the above medical recommendations including the limitation of remote medical advice.  Specific follow-up and call-back criteria were given for patient to follow-up or seek medical care more urgently if needed.  Total duration of direct patient care provided via video conference: 10 minutes    ANobie Putnam DThe PlainsGroup 01/26/2023, 11:44 AM

## 2023-01-26 NOTE — Patient Instructions (Addendum)
Bacterial Conjunctivitis, Adult ?Bacterial conjunctivitis is an infection of the clear membrane that covers the white part of the eye and the inner surface of the eyelid (conjunctiva). When the blood vessels in the conjunctiva become inflamed, the eye becomes red or pink. The eye often feels irritated or itchy. Bacterial conjunctivitis spreads easily from person to person (is contagious). It also spreads easily from one eye to the other eye. ?What are the causes? ?This condition is caused by bacteria. You may get the infection if you come into close contact with: ?A person who is infected with the bacteria. ?Items that are contaminated with the bacteria, such as a face towel, contact lens solution, or eye makeup. ?What increases the risk? ?You are more likely to develop this condition if: ?You are exposed to other people who have the infection. ?You wear contact lenses. ?You have a sinus infection. ?You have had a recent eye injury or surgery. ?You have a weak body defense system (immune system). ?You have a medical condition that causes dry eyes. ?What are the signs or symptoms? ?Symptoms of this condition include: ?Thick, yellowish discharge from the eye. This may turn into a crust on the eyelid overnight and cause your eyelids to stick together. ?Tearing or watery eyes. ?Itchy eyes. ?Burning feeling in your eyes. ?Eye redness. ?Swollen eyelids. ?Blurred vision. ?How is this diagnosed? ?This condition is diagnosed based on your symptoms and medical history. Your health care provider may also take a sample of discharge from your eye to find the cause of your infection. ?How is this treated? ?This condition may be treated with: ?Antibiotic eye drops or ointment to clear the infection more quickly and prevent the spread of infection to others. ?Antibiotic medicines taken by mouth (orally) to treat infections that do not respond to drops or ointments or that last longer than 10 days. ?Cool, wet cloths (cool  compresses) placed on the eyes. ?Artificial tears applied 2-6 times a day. ?Follow these instructions at home: ?Medicines ?Take or apply your antibiotic medicine as told by your health care provider. Do not stop using the antibiotic, even if your condition improves, unless directed by your health care provider. ?Take or apply over-the-counter and prescription medicines only as told by your health care provider. ?Be very careful to avoid touching the edge of your eyelid with the eye-drop bottle or the ointment tube when you apply medicines to the affected eye. This will keep you from spreading the infection to your other eye or to other people. ?Managing discomfort ?Gently wipe away any drainage from your eye with a warm, wet washcloth or a cotton ball. ?Apply a clean, cool compress to your eye for 10-20 minutes, 3-4 times a day. ?General instructions ?Do not wear contact lenses until the inflammation is gone and your health care provider says it is safe to wear them again. Ask your health care provider how to sterilize or replace your contact lenses before you use them again. Wear glasses until you can resume wearing contact lenses. ?Avoid wearing eye makeup until the inflammation is gone. Throw away any old eye cosmetics that may be contaminated. ?Change or wash your pillowcase every day. ?Do not share towels or washcloths. This may spread the infection. ?Wash your hands often with soap and water for at least 20 seconds and especially before touching your face or eyes. Use paper towels to dry your hands. ?Avoid touching or rubbing your eyes. ?Do not drive or use heavy machinery if your vision is blurred. ?Contact   a health care provider if: You have a fever. Your symptoms do not get better after 10 days. Get help right away if: You have a fever and your symptoms suddenly get worse. You have severe pain when you move your eye. You have facial pain, redness, or swelling. You have a sudden loss of  vision. Summary Bacterial conjunctivitis is an infection of the clear membrane that covers the white part of the eye and the inner surface of the eyelid (conjunctiva). Bacterial conjunctivitis spreads easily from eye to eye and from person to person (is contagious). Wash your hands often with soap and water for at least 20 seconds and especially before touching your face or eyes. Use paper towels to dry your hands. Take or apply your antibiotic medicine as told by your health care provider. Do not stop using the antibiotic even if your condition improves. Contact a health care provider if you have a fever or if your symptoms do not get better after 10 days. Get help right away if you have a sudden loss of vision. This information is not intended to replace advice given to you by your health care provider. Make sure you discuss any questions you have with your health care provider. Document Revised: 03/04/2021 Document Reviewed: 03/04/2021 Elsevier Patient Education  [place]. Sinus Infection, Adult A sinus infection, also called sinusitis, is inflammation of your sinuses. Sinuses are hollow spaces in the bones around your face. Your sinuses are located: Around your eyes. In the middle of your forehead. Behind your nose. In your cheekbones. Mucus normally drains out of your sinuses. When your nasal tissues become inflamed or swollen, mucus can become trapped or blocked. This allows bacteria, viruses, and fungi to grow, which leads to infection. Most infections of the sinuses are caused by a virus. A sinus infection can develop quickly. It can last for up to 4 weeks (acute) or for more than 12 weeks (chronic). A sinus infection often develops after a cold. What are the causes? This condition is caused by anything that creates swelling in the sinuses or stops mucus from draining. This includes: Allergies. Asthma. Infection from bacteria or viruses. Deformities or blockages in your nose  or sinuses. Abnormal growths in the nose (nasal polyps). Pollutants, such as chemicals or irritants in the air. Infection from fungi. This is rare. What increases the risk? You are more likely to develop this condition if you: Have a weak body defense system (immune system). Do a lot of swimming or diving. Overuse nasal sprays. Smoke. What are the signs or symptoms? The main symptoms of this condition are pain and a feeling of pressure around the affected sinuses. Other symptoms include: Stuffy nose or congestion that makes it difficult to breathe through your nose. Thick yellow or greenish drainage from your nose. Tenderness, swelling, and warmth over the affected sinuses. A cough that may get worse at night. Decreased sense of smell and taste. Extra mucus that collects in the throat or the back of the nose (postnasal drip) causing a sore throat or bad breath. Tiredness (fatigue). Fever. How is this diagnosed? This condition is diagnosed based on: Your symptoms. Your medical history. A physical exam. Tests to find out if your condition is acute or chronic. This may include: Checking your nose for nasal polyps. Viewing your sinuses using a device that has a light (endoscope). Testing for allergies or bacteria. Imaging tests, such as an MRI or CT scan. In rare cases, a bone biopsy may be  done to rule out more serious types of fungal sinus disease. How is this treated? Treatment for a sinus infection depends on the cause and whether your condition is chronic or acute. If caused by a virus, your symptoms should go away on their own within 10 days. You may be given medicines to relieve symptoms. They include: Medicines that shrink swollen nasal passages (decongestants). A spray that eases inflammation of the nostrils (topical intranasal corticosteroids). Rinses that help get rid of thick mucus in your nose (nasal saline washes). Medicines that treat allergies  (antihistamines). Over-the-counter pain relievers. If caused by bacteria, your health care provider may recommend waiting to see if your symptoms improve. Most bacterial infections will get better without antibiotic medicine. You may be given antibiotics if you have: A severe infection. A weak immune system. If caused by narrow nasal passages or nasal polyps, surgery may be needed. Follow these instructions at home: Medicines Take, use, or apply over-the-counter and prescription medicines only as told by your health care provider. These may include nasal sprays. If you were prescribed an antibiotic medicine, take it as told by your health care provider. Do not stop taking the antibiotic even if you start to feel better. Hydrate and humidify  Drink enough fluid to keep your urine pale yellow. Staying hydrated will help to thin your mucus. Use a cool mist humidifier to keep the humidity level in your home above 50%. Inhale steam for 10-15 minutes, 3-4 times a day, or as told by your health care provider. You can do this in the bathroom while a hot shower is running. Limit your exposure to cool or dry air. Rest Rest as much as possible. Sleep with your head raised (elevated). Make sure you get enough sleep each night. General instructions  Apply a warm, moist washcloth to your face 3-4 times a day or as told by your health care provider. This will help with discomfort. Use nasal saline washes as often as told by your health care provider. Wash your hands often with soap and water to reduce your exposure to germs. If soap and water are not available, use hand sanitizer. Do not smoke. Avoid being around people who are smoking (secondhand smoke). Keep all follow-up visits. This is important. Contact a health care provider if: You have a fever. Your symptoms get worse. Your symptoms do not improve within 10 days. Get help right away if: You have a severe headache. You have persistent  vomiting. You have severe pain or swelling around your face or eyes. You have vision problems. You develop confusion. Your neck is stiff. You have trouble breathing. These symptoms may be an emergency. Get help right away. Call 911. Do not wait to see if the symptoms will go away. Do not drive yourself to the hospital. Summary A sinus infection is soreness and inflammation of your sinuses. Sinuses are hollow spaces in the bones around your face. This condition is caused by nasal tissues that become inflamed or swollen. The swelling traps or blocks the flow of mucus. This allows bacteria, viruses, and fungi to grow, which leads to infection. If you were prescribed an antibiotic medicine, take it as told by your health care provider. Do not stop taking the antibiotic even if you start to feel better. Keep all follow-up visits. This is important. This information is not intended to replace advice given to you by your health care provider. Make sure you discuss any questions you have with your health care provider. Document  Revised: 10/27/2021 Document Reviewed: 10/27/2021 Elsevier Patient Education  [place].   Please schedule a Follow-up Appointment to: Return if symptoms worsen or fail to improve.  If you have any other questions or concerns, please feel free to call the office or send a message through [place]. You may also schedule an earlier appointment if necessary.  Additionally, you may be receiving a survey about your experience at our office within a few days to 1 week by e-mail or mail. We value your feedback.  Nobie Putnam, DO [place]

## 2023-02-15 ENCOUNTER — Ambulatory Visit (INDEPENDENT_AMBULATORY_CARE_PROVIDER_SITE_OTHER): Payer: No Typology Code available for payment source | Admitting: Neurology

## 2023-02-15 ENCOUNTER — Encounter: Payer: Self-pay | Admitting: Neurology

## 2023-02-15 VITALS — BP 102/67 | HR 94 | Ht 64.0 in | Wt 142.0 lb

## 2023-02-15 DIAGNOSIS — G250 Essential tremor: Secondary | ICD-10-CM

## 2023-02-15 DIAGNOSIS — M329 Systemic lupus erythematosus, unspecified: Secondary | ICD-10-CM

## 2023-02-15 DIAGNOSIS — G629 Polyneuropathy, unspecified: Secondary | ICD-10-CM

## 2023-02-15 MED ORDER — PREGABALIN 50 MG PO CAPS: 50.0000 mg | ORAL_CAPSULE | Freq: Two times a day (BID) | ORAL | 2 refills | Status: DC

## 2023-02-15 MED ORDER — PRIMIDONE 50 MG PO TABS: 50.0000 mg | ORAL_TABLET | Freq: Two times a day (BID) | ORAL | 6 refills | Status: DC

## 2023-02-15 NOTE — Patient Instructions (Addendum)
Start primidone 50 mg nightly for essential tremor, can increase to twice daily as tolerated. Start Lyrica 50 mg nightly for peripheral neuropathy, again can also increase to 50 mg twice daily as tolerated. Continue with the medication Follow-up in 3 months or sooner if worse.
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
## 2023-02-16 ENCOUNTER — Encounter: Payer: Self-pay | Admitting: Neurology

## 2023-02-17 ENCOUNTER — Other Ambulatory Visit: Payer: Self-pay | Admitting: Neurology

## 2023-02-17 MED ORDER — PREGABALIN 25 MG PO CAPS: 25.0000 mg | ORAL_CAPSULE | Freq: Two times a day (BID) | ORAL | 0 refills | Status: DC

## 2023-03-09 ENCOUNTER — Encounter: Payer: Self-pay | Admitting: Neurology

## 2023-04-01 ENCOUNTER — Other Ambulatory Visit: Payer: Self-pay | Admitting: Family Medicine

## 2023-04-01 DIAGNOSIS — G2581 Restless legs syndrome: Secondary | ICD-10-CM

## 2023-04-04 NOTE — Telephone Encounter (Signed)
Unable to refill per protocol, Rx request is too soon. Last refill 12/23/22 for 90 and 1 refill.  Requested Prescriptions  Pending Prescriptions Disp Refills   rOPINIRole (REQUIP) 1 MG tablet [Pharmacy Med Name: ROPINIROLE HCL 1 MG TABLET] 90 tablet 1    Sig: TAKE 1 TABLET BY MOUTH AT BEDTIME. MAY START WITH HALF TAB 0.5MG  DOSE AND WORK UP TO 1MG  NIGHTLY. IF STILL INEFFECTIVE, CAN DOUBLE DOSE TO 2MG .     Neurology:  Parkinsonian Agents Passed - 04/01/2023 10:26 PM      Passed - Last BP in normal range    BP Readings from Last 1 Encounters:  02/15/23 102/67         Passed - Last Heart Rate in normal range    Pulse Readings from Last 1 Encounters:  02/15/23 94         Passed - Valid encounter within last 12 months    Recent Outpatient Visits           2 months ago Acute non-recurrent frontal sinusitis   [place] [facility] [place], Netta Neat, DO   3 months ago Intention tremor   [place] [facility] Smitty Cords, [place]   11 months ago Vasovagal episode   [place] [facility] [place], Netta Neat, DO   1 year ago Moderate persistent asthma with acute exacerbation   [place] [facility] [place], Netta Neat, DO   1 year ago Moderate persistent asthma with acute exacerbation    [place] [facility] [place], Netta Neat, [place]

## 2023-04-26 ENCOUNTER — Other Ambulatory Visit: Payer: Self-pay | Admitting: Neurology

## 2023-05-18 ENCOUNTER — Other Ambulatory Visit: Payer: Self-pay | Admitting: Family Medicine

## 2023-05-18 DIAGNOSIS — G2581 Restless legs syndrome: Secondary | ICD-10-CM

## 2023-05-19 NOTE — Telephone Encounter (Signed)
Requested Prescriptions  Pending Prescriptions Disp Refills   rOPINIRole (REQUIP) 1 MG tablet [Pharmacy Med Name: ROPINIROLE HCL 1 MG TABLET] 90 tablet 0    Sig: TAKE 1 TABLET BY MOUTH AT BEDTIME. MAY START WITH HALF TAB 0.5MG  DOSE AND WORK UP TO 1MG  NIGHTLY. IF STILL INEFFECTIVE, CAN DOUBLE DOSE TO 2MG .     Neurology:  Parkinsonian Agents Passed - 05/18/2023  4:53 PM      Passed - Last BP in normal range    BP Readings from Last 1 Encounters:  02/15/23 102/67         Passed - Last Heart Rate in normal range    Pulse Readings from Last 1 Encounters:  02/15/23 94         Passed - Valid encounter within last 12 months    Recent Outpatient Visits           3 months ago Acute non-recurrent frontal sinusitis   [place] [facility] [place], Netta Neat, DO   4 months ago Intention tremor   [place] [facility] Smitty Cords, DO   1 year ago Vasovagal episode   [place] [facility] Smitty Cords, DO   1 year ago Moderate persistent asthma with acute exacerbation   [place] [facility] [place], Netta Neat, DO   1 year ago Moderate persistent asthma with acute exacerbation   [place] [facility] [place], Netta Neat, [place]

## 2023-06-08 ENCOUNTER — Other Ambulatory Visit: Payer: Self-pay | Admitting: Family Medicine

## 2023-06-08 DIAGNOSIS — G2581 Restless legs syndrome: Secondary | ICD-10-CM

## 2023-06-08 NOTE — Telephone Encounter (Signed)
Unable to refill per protocol, Rx request is too soon.  Requested Prescriptions  Pending Prescriptions Disp Refills   rOPINIRole (REQUIP) 1 MG tablet [Pharmacy Med Name: ROPINIROLE HCL 1 MG TABLET] 60 tablet 1    Sig: TAKE 1 TABLET BY MOUTH AT BEDTIME. MAY START WITH HALF TAB 0.5MG  DOSE AND WORK UP TO 1MG  NIGHTLY. IF STILL INEFFECTIVE, CAN DOUBLE DOSE TO 2MG .     Neurology:  Parkinsonian Agents Passed - 06/08/2023  9:32 AM      Passed - Last BP in normal range    BP Readings from Last 1 Encounters:  02/15/23 102/67         Passed - Last Heart Rate in normal range    Pulse Readings from Last 1 Encounters:  02/15/23 94         Passed - Valid encounter within last 12 months    Recent Outpatient Visits           4 months ago Acute non-recurrent frontal sinusitis   [place] [facility] [place], Netta Neat, DO   5 months ago Intention tremor   [place] [facility] Smitty Cords, DO   1 year ago Vasovagal episode   [facility] Smitty Cords, DO   1 year ago Moderate persistent asthma with acute exacerbation   [place] [facility] [place], Netta Neat, DO   1 year ago Moderate persistent asthma with acute exacerbation   [place] [facility] [place], Netta Neat, [place]

## 2023-06-17 ENCOUNTER — Other Ambulatory Visit: Payer: Self-pay | Admitting: Family Medicine

## 2023-06-17 ENCOUNTER — Other Ambulatory Visit: Payer: Self-pay | Admitting: Neurology

## 2023-06-17 DIAGNOSIS — G2581 Restless legs syndrome: Secondary | ICD-10-CM

## 2023-06-20 NOTE — Telephone Encounter (Signed)
Requested Prescriptions   Pending Prescriptions Disp Refills   pregabalin (LYRICA) 25 MG capsule [Pharmacy Med Name: PREGABALIN 25 MG CAPSULE] 30 capsule 2    Sig: TAKE 1 CAPSULE (25 MG TOTAL) BY MOUTH 2 (TWO) TIMES DAILY FOR 90 DOSES.   Last seen 02/15/23, next appt scheduled for 08/03/23 Dispenses  Routing to provider to fill Dispensed Days Supply Quantity Provider Pharmacy  PREGABALIN 25 MG CAPSULE 06/04/2023 15 30 each Windell Norfolk, MD [facility] [phone] - B...  PREGABALIN 25 MG CAPSULE 05/18/2023 15 30 each Windell Norfolk, MD [facility] [phone] - B...  PREGABALIN 25 MG CAPSULE 04/26/2023 15 30 each Windell Norfolk, MD [facility] [phone] - B...  PREGABALIN 25 MG CAPSULE 02/17/2023 10 20 each Windell Norfolk, MD [facility] [phone] - B...  PREGABALIN 50 MG CAPSULE 02/15/2023 30 60 each Windell Norfolk, MD [facility] [phone] - B.Marland KitchenMarland Kitchen

## 2023-06-29 ENCOUNTER — Other Ambulatory Visit: Payer: Self-pay | Admitting: Family Medicine

## 2023-06-29 DIAGNOSIS — Z1231 Encounter for screening mammogram for malignant neoplasm of breast: Secondary | ICD-10-CM

## 2023-07-13 ENCOUNTER — Ambulatory Visit
Admission: RE | Admit: 2023-07-13 | Discharge: 2023-07-13 | Disposition: A | Discharge status: Home / Self Care | Payer: No Typology Code available for payment source | Source: Ambulatory Visit | Attending: Family Medicine | Admitting: Family Medicine

## 2023-07-13 DIAGNOSIS — Z1231 Encounter for screening mammogram for malignant neoplasm of breast: Secondary | ICD-10-CM | POA: Insufficient documentation

## 2023-08-03 ENCOUNTER — Ambulatory Visit (INDEPENDENT_AMBULATORY_CARE_PROVIDER_SITE_OTHER): Payer: No Typology Code available for payment source | Admitting: Neurology

## 2023-08-03 ENCOUNTER — Encounter: Payer: Self-pay | Admitting: Neurology

## 2023-08-03 VITALS — BP 107/65 | HR 99 | Ht 64.0 in | Wt 145.0 lb

## 2023-08-03 DIAGNOSIS — G250 Essential tremor: Secondary | ICD-10-CM | POA: Diagnosis not present

## 2023-08-03 DIAGNOSIS — G2581 Restless legs syndrome: Secondary | ICD-10-CM | POA: Diagnosis not present

## 2023-08-03 DIAGNOSIS — G629 Polyneuropathy, unspecified: Secondary | ICD-10-CM

## 2023-08-03 MED ORDER — ROPINIROLE HCL 1 MG PO TABS: 1.0000 mg | ORAL_TABLET | Freq: Every day | ORAL | 3 refills | Status: DC

## 2023-08-03 MED ORDER — PREGABALIN 25 MG PO CAPS: 25.0000 mg | ORAL_CAPSULE | Freq: Two times a day (BID) | ORAL | 2 refills | Status: DC

## 2023-08-03 NOTE — Patient Instructions (Signed)
Continue with Requip 1 mg nightly Continue with Lyrica 50 mg twice daily Discuss with cardiology regarding increasing the metoprolol vs. adding propranolol for essential tremor Continue your other medications Follow-up in 6 months or sooner if worse.
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
## 2023-08-04 ENCOUNTER — Telehealth: Payer: Self-pay | Admitting: Internal Medicine

## 2023-08-04 NOTE — Telephone Encounter (Signed)
Called patient, LVM to call back to discuss.  Left call back number.   

## 2023-08-04 NOTE — Telephone Encounter (Signed)
Follow Up:     Patient is retuning a call from today.

## 2023-08-04 NOTE — Telephone Encounter (Signed)
Pt c/o medication issue:  1. Name of Medication:   metoprolol tartrate (LOPRESSOR) 25 MG tablet    2. How are you currently taking this medication (dosage and times per day)?  Take 1 tablet (25 mg) by mouth twice daily       3. Are you having a reaction (difficulty breathing--STAT)? No  4. What is your medication issue? Pt stated her PCP wants to increase this medication to 1.5 pills twice a day but pt wants to make sure it's okay with MD first. Please advise

## 2023-08-04 NOTE — Telephone Encounter (Signed)
Attempted to contact patient, unable to reach.  LVM

## 2023-08-09 NOTE — Telephone Encounter (Signed)
Patient called back. Advised of the following note from her Neurologist:   57 y.o. year old female with history of SLE, Raynaud's phenomenon and SVT who is presenting for follow-up for her essential tremor and peripheral neuropathy.  For her essential tremor, she could not tolerate the primidone, she does have a history of asthma but reports that her asthma is well-controlled now she has not used her inhaler for long time.  She is also on metoprolol tartrate for her history of tachycardia.  I have advised her to contact her cardiologist to inquire if we can increase the metoprolol to tartrate (Do not want to add Propanolol since she is already on Metoprolol).  We will continue her on Lyrica and Requip for restless leg syndrome. I will see her in 6 months for follow up.      They are wanting to increase Metoprolol to 1.5 tablet twice daily to see if it improved tremors. Will route to MD, patient has not yet started the increase.  Thanks!

## 2023-08-09 NOTE — Telephone Encounter (Signed)
 Patient is returning call. Transferred to [place], LPN.

## 2023-08-09 NOTE — Telephone Encounter (Signed)
Attempted to contact patient to discuss PCP need to increase medication.   Left message to call back. Left call back number.

## 2023-08-14 ENCOUNTER — Other Ambulatory Visit: Payer: Self-pay | Admitting: Family Medicine

## 2023-08-14 DIAGNOSIS — F419 Anxiety disorder, unspecified: Secondary | ICD-10-CM

## 2023-08-16 NOTE — Telephone Encounter (Signed)
They can certainly try and increase metoprolol   I suspect meto ( and not propranolol ) was chosen so as toavoid aggravating her rRaynauds >> prob still a worthy goal.

## 2023-08-16 NOTE — Telephone Encounter (Signed)
Called patient, LVM with information below that okay to increase medication as recommended.   Left call back number for questions/concerns.

## 2023-08-16 NOTE — Telephone Encounter (Signed)
Requested medications are due for refill today.  yes  Requested medications are on the active medications list.  yes  Last refill. 08/26/2022 #90 3 rf  Future visit scheduled.   no  Notes to clinic.  Labs are expired - pt needs an OV.    Requested Prescriptions  Pending Prescriptions Disp Refills   DULoxetine (CYMBALTA) 60 MG capsule [Pharmacy Med Name: DULOXETINE CAP 60MG  DR] 90 capsule 3    Sig: TAKE 1 CAPSULE DAILY     Psychiatry: Antidepressants - SNRI - duloxetine Failed - 08/14/2023  3:36 AM      Failed - Cr in normal range and within 360 days    Creat  Date Value Ref Range Status  05/04/2017 0.89 0.50 - 1.05 mg/dL Final    Comment:      For patients > or = 57 years of age: The upper reference limit for Creatinine is approximately 13% higher for people identified as African-American.      Creatinine, Ser  Date Value Ref Range Status  01/21/2020 0.81 0.44 - 1.00 mg/dL Final   Creatinine, Urine  Date Value Ref Range Status  01/21/2020 44 mg/dL Final         Failed - eGFR is 30 or above and within 360 days    GFR, Est African American  Date Value Ref Range Status  05/04/2017 87 >=60 mL/min Final   GFR calc Af Amer  Date Value Ref Range Status  01/21/2020 >60 >60 mL/min Final   GFR, Est Non African American  Date Value Ref Range Status  05/04/2017 76 >=60 mL/min Final   GFR calc non Af Amer  Date Value Ref Range Status  01/21/2020 >60 >60 mL/min Final   GFR  Date Value Ref Range Status  11/11/2015 70.61 >60.00 mL/min Final         Failed - Valid encounter within last 6 months    Recent Outpatient Visits           6 months ago Acute non-recurrent frontal sinusitis   [place] [facility] Smitty Cords, DO   7 months ago Intention tremor   [place] [facility] Smitty Cords, DO   1 year ago Vasovagal episode   [place] [facility] Smitty Cords, DO   1 year ago  Moderate persistent asthma with acute exacerbation   [place] [facility] [place], Netta Neat, DO   1 year ago Moderate persistent asthma with acute exacerbation   [place] [facility] [place], Netta Neat, DO              Passed - Completed PHQ-2 or PHQ-9 in the last 360 days      Passed - Last BP in normal range    BP Readings from Last 1 Encounters:  08/03/23 107/65

## 2023-09-03 ENCOUNTER — Other Ambulatory Visit: Payer: Self-pay | Admitting: Neurology

## 2023-09-03 ENCOUNTER — Other Ambulatory Visit: Payer: Self-pay | Admitting: Family Medicine

## 2023-09-03 DIAGNOSIS — G2581 Restless legs syndrome: Secondary | ICD-10-CM

## 2023-09-05 NOTE — Telephone Encounter (Signed)
Last RF Dr Teresa Coombs Neurologist  Requested Prescriptions  Refused Prescriptions Disp Refills   rOPINIRole (REQUIP) 1 MG tablet [Pharmacy Med Name: ROPINIROLE HCL 1 MG TABLET] 60 tablet 1    Sig: TAKE 1 TABLET BY MOUTH AT BEDTIME. MAY START WITH HALF TAB 0.5MG  DOSE AND WORK UP TO 1MG  NIGHTLY. IF STILL INEFFECTIVE, CAN DOUBLE DOSE TO 2MG .     Neurology:  Parkinsonian Agents Passed - 09/03/2023 11:40 AM      Passed - Last BP in normal range    BP Readings from Last 1 Encounters:  08/03/23 107/65         Passed - Last Heart Rate in normal range    Pulse Readings from Last 1 Encounters:  08/03/23 99         Passed - Valid encounter within last 12 months    Recent Outpatient Visits           7 months ago Acute non-recurrent frontal sinusitis   [place] [facility] [place], Netta Neat, DO   8 months ago Intention tremor   [place] [facility] Smitty Cords, DO   1 year ago Vasovagal episode   [place] [facility] Smitty Cords, DO   1 year ago Moderate persistent asthma with acute exacerbation   [place] [facility] [place], Netta Neat, DO   1 year ago Moderate persistent asthma with acute exacerbation   [place] [facility] [place], Netta Neat, [place]

## 2023-09-06 NOTE — Progress Notes (Unsigned)
Cardiology Office Note Date:  09/07/2023  Patient ID:  Christina Meza, DOB 10/14/1966, MRN [id number] PCP:  Smitty Cords, DO  Cardiologist:  None Electrophysiologist: Sherryl Manges, MD    Chief Complaint: inappropriate sinus tach follow-up  History of Present Illness: Christina Meza is a 57 y.o. female with PMH notable for inappropriate sinus tach iso SLE, Raynauds; seen today for Sherryl Manges, MD for routine electrophysiology followup.  She last saw Dr. Graciela Husbands 07/2021 was doing well on lopressor 25mg  BID.  In the interim, her rheumatologist has started her on amlodipine for reynauds, which has helped tremendously. Neurologist has increased lopressor to 37.5mg  BID for her essential tremor.  She is overall doing well today from a cardiac perspective. Only complaint is fatigue. She has brief palpitation episodes that are like a hiccup, rarely occurring. No chest pain, chest pressure with palpitation.  She had some nausea yesterday, which is typical for her after her benlysta injections. Skipped dinner last night, had gatorade instead. She does not own a BP cuff.    Past Medical History:  Diagnosis Date   Allergy    Arthritis    Asthma    Clostridium difficile infection    2015x2 , 2016x1   Depression    Diastolic dysfunction    a. TTE 1/18: EF 50-55%, no RWMA, Gr1DD, mildly dilated aortic root of 3.4 cm, mildly dilated ascending aorta of 3.7 cm, mild mitral regurgitation, normal size left atrium, normal RV systolic function, PASP normal   Genital warts    GERD (gastroesophageal reflux disease)    Inappropriate sinus tachycardia (HCC)    a. 2010 Nuc Stress test: no ischemia/infarct;  b. 10/2013 Echo: Nl LV fxn, mild MR/TR.   Migraine    Palpitations    Prolonged QT interval    SLE (systemic lupus erythematosus) (HCC)    Stomach ulcer     Past Surgical History:  Procedure Laterality Date   BREAST BIOPSY Right 03/01/2017   u/s core bx neg ADENOSIS AND FOCAL  MICROCYST FORMATION.   BREAST CYST ASPIRATION Left 4-5 yrs ago   NEG   CATARACT EXTRACTION  2008   ENDOSCOPIC VEIN LASER TREATMENT     ESOPHAGOGASTRODUODENOSCOPY N/A 07/31/2021   Procedure: ESOPHAGOGASTRODUODENOSCOPY (EGD);  Surgeon: Toledo, Boykin Nearing, MD;  Location: [facility] ENDOSCOPY;  Service: Gastroenterology;  Laterality: N/A;   UMBILICAL HERNIA REPAIR     vulvar excision for HPV  07/04/2014    Current Outpatient Medications  Medication Instructions   albuterol (VENTOLIN HFA) 108 (90 Base) MCG/ACT inhaler TAKE 2 PUFFS BY MOUTH EVERY 6 HOURS AS NEEDED FOR WHEEZE OR SHORTNESS OF BREATH   amLODipine (NORVASC) 2.5 mg, Oral, Daily   Belimumab 200 mg, Subcutaneous   Biotin 5000 MCG TABS Oral   Cholecalciferol (VITAMIN D3) 3000 units TABS Oral, Daily   diclofenac sodium (VOLTAREN) 1 % GEL 1 application , Topical, As needed   DULoxetine (CYMBALTA) 60 MG capsule TAKE 1 CAPSULE DAILY   estradiol (CLIMARA - DOSED IN MG/24 HR) 0.025 mg/24hr patch APPLY 1 PATCH ON THE SKIN  ONCE A WEEK   folic acid (FOLVITE) 1 mg, Oral, Daily   hydroxychloroquine (PLAQUENIL) 200 mg, Oral, 2 times daily, Alternating 200 mg once a day and next day twice a day.   metoprolol tartrate (LOPRESSOR) 37.5 mg, Oral, 2 times daily   Multiple Vitamins-Minerals (MULTIVITAMIN WITH MINERALS) tablet 1 tablet, Oral, BH-each morning   mupirocin ointment (BACTROBAN) 2 % 1 Application, Topical, 2 times daily, For 1-2 weeks  naproxen sodium (ALEVE) 220 mg, Oral, Daily at bedtime   pregabalin (LYRICA) 25 mg, Oral, 2 times daily   Probiotic Product (PROBIOTIC DAILY PO) 1 tablet, Oral, Daily   progesterone (PROMETRIUM) 100 mg, Oral, Daily at bedtime   rOPINIRole (REQUIP) 1 mg, Oral, Daily at bedtime   valACYclovir (VALTREX) 500 mg, Oral, Daily at bedtime    Social History:  The patient  reports that she has never smoked. She has never used smokeless tobacco. She reports current alcohol use. She reports that she does not use drugs.    Family History:  The patient's family history includes Arthritis in her mother; Bipolar disorder in her mother; Colon cancer in her paternal grandfather; Depression in her mother; Heart disease in her maternal grandfather; Hyperlipidemia in her maternal grandfather; Hypertension in her maternal grandfather and maternal grandmother; Mental illness in her mother; Stroke in her maternal grandfather.  ROS:  Please see the history of present illness. All other systems are reviewed and otherwise negative.   PHYSICAL EXAM:  VS:  BP 94/62 (BP Location: Left Arm, Patient Position: Sitting, Cuff Size: Normal)   Pulse 81   Ht 5\' 4"  (1.626 m)   Wt 144 lb 6 oz (65.5 kg)   LMP 07/15/2015 (Within Days)   SpO2 95%   BMI 24.78 kg/m  BMI: Body mass index is 24.78 kg/m.  GEN- The patient is well appearing, alert and oriented x 3 today.   Lungs- Clear to ausculation bilaterally, normal work of breathing.  Heart- Regular rate and rhythm, no murmurs, rubs or gallops Extremities- No peripheral edema, warm, dry   EKG is ordered. Personal review of EKG from today shows:    EKG Interpretation Date/Time:  Wednesday September 07 2023 13:15:16 EDT Ventricular Rate:  81 PR Interval:  170 QRS Duration:  94 QT Interval:  400 QTC Calculation: 464 R Axis:   16  Text Interpretation: Normal sinus rhythm Possible Left atrial enlargement Prolonged QT Confirmed by Sherie Don [phone]) on 09/07/2023 1:17:17 PM    Recent Labs: No results found for requested labs within last 365 days.  No results found for requested labs within last 365 days.   CrCl cannot be calculated (Patient's most recent lab result is older than the maximum 21 days allowed.).   Wt Readings from Last 3 Encounters:  09/07/23 144 lb 6 oz (65.5 kg)  08/03/23 145 lb (65.8 kg)  02/15/23 142 lb (64.4 kg)     Additional studies reviewed include: Previous EP, cardiology notes.   TTE, 09/08/2021  1. Left ventricular ejection fraction, by  estimation, is 50 to 55%. The left ventricle has low normal function. The left ventricle demonstrates global hypokinesis. Left ventricular diastolic parameters are consistent with Grade I diastolic dysfunction (impaired relaxation). The average left ventricular global longitudinal strain is -14.7 %.   2. Right ventricular systolic function is normal. The right ventricular size is normal. There is normal pulmonary artery systolic pressure. The estimated right ventricular systolic pressure is 22.3 mmHg.   3. The mitral valve is normal in structure. Mild mitral valve regurgitation. No evidence of mitral stenosis.   4. The aortic valve is normal in structure. Trileaflet. Aortic valve regurgitation is mild. No aortic stenosis is present.   5. There is dilatation of the aortic root, measuring 39 mm. There is mild dilatation of the ascending aorta, measuring 40 mm.   Comparison(s): LVEF 50-55%, Asc. Aorta 3.7 cm.   CT coronary morph, 08/28/2020 1. Coronary calcium score of 0. Patient is  low risk for near term coronary events  2. Normal coronary origin with right dominance.  3. No evidence of CAD.  4. CAD-RADS 0. Consider non-atherosclerotic causes of chest pain.  Holter monitor, 12/16/2017 Normal sinus rhythm. Rare PVCs and PACs. Frequent sinus tachycardia with an average heart rate of 101 bpm.  54% of total beats were in the tachycardia range.  TTE, 12/17/2016 - Left ventricle: The cavity size was normal. Systolic function was normal. The estimated ejection fraction was in the range of 50% to 55%. Wall motion was normal; there were no regional wall motion abnormalities. Doppler parameters are consistent with abnormal left ventricular relaxation (grade 1 diastolic dysfunction).  - Aortic root: The aortic root was mildly dilated, 3.4 cm  - Ascending aorta: The ascending aorta was mildly dilated, 3.7 cm  - Mitral valve: There was mild regurgitation.  - Left atrium: The atrium was normal in size.  -  Right ventricle: Systolic function was normal.  - Pulmonary arteries: Systolic pressure was within the normal range.   ASSESSMENT AND PLAN:  #) inappropriate sinus tach Recent brief palpitation episodes are very brief in duration, likely PAC vs PVC Recommended she notify office if duration or severity increase Tolerating recent lopressor increase to 37.5mg  BID, will continue  #) Raynauds #) Hypotension Rheumatologist recently started 2.5mg  amlodipine nightly Recommended she obtain BP cuff to monitor BP at home Increase fluid intake If systolic BP is less than , skip amlodipine      Current medicines are reviewed at length with the patient today.   The patient does not have concerns regarding her medicines.  The following changes were made today:  none  Labs/ tests ordered today include:  Orders Placed This Encounter  Procedures   EKG 12-Lead     Disposition: Follow up with Dr. Graciela Husbands or EP APP in 12 months   Signed, Sherie Don, NP  09/07/23  1:35 PM  Electrophysiology [facility]

## 2023-09-07 ENCOUNTER — Ambulatory Visit: Payer: No Typology Code available for payment source | Attending: Cardiology | Admitting: Cardiology

## 2023-09-07 ENCOUNTER — Encounter: Payer: Self-pay | Admitting: Cardiology

## 2023-09-07 VITALS — BP 94/62 | HR 81 | Ht 64.0 in | Wt 144.4 lb

## 2023-09-07 DIAGNOSIS — I4711 Inappropriate sinus tachycardia, so stated: Secondary | ICD-10-CM

## 2023-09-07 MED ORDER — METOPROLOL TARTRATE 25 MG PO TABS: 37.5000 mg | ORAL_TABLET | Freq: Two times a day (BID) | ORAL | 3 refills | Status: DC

## 2023-09-07 NOTE — Patient Instructions (Signed)
Medication Instructions:  The current medical regimen is effective;  continue present plan and medications.  *If you need a refill on your cardiac medications before your next appointment, please call your pharmacy*   Follow-Up: At [facility], you and your health needs are our priority.  As part of our continuing mission to provide you with exceptional heart care, we have created designated Provider Care Teams.  These Care Teams include your primary Cardiologist (physician) and Advanced Practice Providers (APPs -  Physician Assistants and Nurse Practitioners) who all work together to provide you with the care you need, when you need it.  We recommend signing up for the patient portal called "MyChart".  Sign up information is provided on this After Visit Summary.  MyChart is used to connect with patients for Virtual Visits (Telemedicine).  Patients are able to view lab/test results, encounter notes, upcoming appointments, etc.  Non-urgent messages can be sent to your provider as well.   To learn more about what you can do with MyChart, go to [email or web address].    Your next appointment:   12 month(s)  Provider:   Sherryl Manges, MD or Sherie Don, NP    Other Instructions Increase salt/fluid intake, get a blood pressure cuff to use at home.

## 2023-10-04 ENCOUNTER — Other Ambulatory Visit: Payer: Self-pay | Admitting: Family Medicine

## 2023-10-04 DIAGNOSIS — F32A Depression, unspecified: Secondary | ICD-10-CM

## 2023-10-05 NOTE — Telephone Encounter (Signed)
Requested by interface surescripts. Courtesy refill. Future visit in 1 month .  Requested Prescriptions  Pending Prescriptions Disp Refills   DULoxetine (CYMBALTA) 60 MG capsule [Pharmacy Med Name: DULOXETINE CAP 60MG  DR] 90 capsule 0    Sig: TAKE 1 CAPSULE DAILY     Psychiatry: Antidepressants - SNRI - duloxetine Failed - 10/04/2023  9:02 PM      Failed - Cr in normal range and within 360 days    Creat  Date Value Ref Range Status  05/04/2017 0.89 0.50 - 1.05 mg/dL Final    Comment:      For patients > or = 57 years of age: The upper reference limit for Creatinine is approximately 13% higher for people identified as African-American.      Creatinine, Ser  Date Value Ref Range Status  01/21/2020 0.81 0.44 - 1.00 mg/dL Final   Creatinine, Urine  Date Value Ref Range Status  01/21/2020 44 mg/dL Final         Failed - eGFR is 30 or above and within 360 days    GFR, Est African American  Date Value Ref Range Status  05/04/2017 87 >=60 mL/min Final   GFR calc Af Amer  Date Value Ref Range Status  01/21/2020 >60 >60 mL/min Final   GFR, Est Non African American  Date Value Ref Range Status  05/04/2017 76 >=60 mL/min Final   GFR calc non Af Amer  Date Value Ref Range Status  01/21/2020 >60 >60 mL/min Final   GFR  Date Value Ref Range Status  11/11/2015 70.61 >60.00 mL/min Final         Failed - Valid encounter within last 6 months    Recent Outpatient Visits           8 months ago Acute non-recurrent frontal sinusitis   [facility] Smitty Cords, DO   9 months ago Intention tremor   [facility] Smitty Cords, DO   1 year ago Vasovagal episode   [place] [facility] Smitty Cords, DO   1 year ago Moderate persistent asthma with acute exacerbation   [facility], Netta Neat, DO   2 years ago Moderate persistent asthma  with acute exacerbation   [place] [facility] [place], Netta Neat, DO       Future Appointments             In 3 weeks Althea Charon, Netta Neat, DO [facility]            Passed - Completed PHQ-2 or PHQ-9 in the last 360 days      Passed - Last BP in normal range    BP Readings from Last 1 Encounters:  09/07/23 94/62

## 2023-10-26 ENCOUNTER — Other Ambulatory Visit: Payer: Self-pay | Admitting: Family Medicine

## 2023-10-26 ENCOUNTER — Ambulatory Visit: Payer: No Typology Code available for payment source | Admitting: Family Medicine

## 2023-10-26 VITALS — BP 102/64 | Ht 64.0 in | Wt 144.0 lb

## 2023-10-26 DIAGNOSIS — M328 Other forms of systemic lupus erythematosus: Secondary | ICD-10-CM

## 2023-10-26 DIAGNOSIS — Z0000 Encounter for general adult medical examination without abnormal findings: Secondary | ICD-10-CM

## 2023-10-26 DIAGNOSIS — F331 Major depressive disorder, recurrent, moderate: Secondary | ICD-10-CM

## 2023-10-26 DIAGNOSIS — F411 Generalized anxiety disorder: Secondary | ICD-10-CM

## 2023-10-26 DIAGNOSIS — F32A Depression, unspecified: Secondary | ICD-10-CM

## 2023-10-26 DIAGNOSIS — Z79899 Other long term (current) drug therapy: Secondary | ICD-10-CM

## 2023-10-26 DIAGNOSIS — E782 Mixed hyperlipidemia: Secondary | ICD-10-CM

## 2023-10-26 DIAGNOSIS — F419 Anxiety disorder, unspecified: Secondary | ICD-10-CM

## 2023-10-26 MED ORDER — DULOXETINE HCL 60 MG PO CPEP: 60.0000 mg | ORAL_CAPSULE | Freq: Every day | ORAL | 3 refills | Status: DC

## 2023-10-26 MED ORDER — CLONAZEPAM 0.5 MG PO TABS
0.2500 mg | ORAL_TABLET | Freq: Two times a day (BID) | ORAL | 2 refills | Status: DC | PRN
Start: 2023-10-26 — End: 2024-08-21

## 2023-10-26 NOTE — Progress Notes (Unsigned)
Subjective:    Patient ID: Christina Meza, female    DOB: October 25, 1966, 57 y.o.   MRN: [id number]  Christina Meza is a 57 y.o. female presenting on 10/26/2023 for Medical Management of Chronic Issues (Patient doesn't feel the cymbalta is helping)   HPI  Discussed the use of AI scribe software for clinical note transcription with the patient, who gave verbal consent to proceed.  *** The patient, with a history of chronic pain managed with Cymbalta, presented for a medication follow-up and refill. She reported persistent irritability and a "short fuse," which she described as feeling on edge and easily agitated. She had previously tried Buspar, increasing the dose up to 15mg , but found no relief from her symptoms and discontinued its use.  The patient also reported feeling overworked and tired due to her responsibilities at work and at home, where she cares for her mother. She expressed frustration with the constant demands and lack of support, which she believes contribute to her irritability.  In addition to her mental health concerns, the patient experienced episodes of dizziness and falls, particularly when changing positions or during the night. She described these episodes as vertigo-like, with the room spinning. She had tried meclizine and had been given exercises by a chiropractor for benign proximal positional vertigo (BPPV), which seemed to help reduce the frequency of these episodes.  The patient also reported that the Cymbalta, while not helping with her irritability, had been effective in managing her joint pain. She expressed a desire to continue the Cymbalta and explore additional treatment options for her irritability. She had previously tried therapy but found it unhelpful and was not open to having someone come into the home to provide support.  The patient denied any alcohol use and reported no other significant changes in her health or lifestyle that could contribute to  her symptoms. She expressed a willingness to try new medications to manage her irritability and agreed to a follow-up appointment in three months.      Anxiety / Chronic Depression recurrent, moderate Insomnia On SNRI Duloxetine with improvement. Still has anxiety and insomnia however   SLE / Lupus Followed by [facility] - Dr Oretha Ellis On medication management.   Intention Tremor Need consult referral back to Neurologist at [facility] Intention Tremor - notice more at work when doing fine motor tasks at work and applying lipstick. Not noticed with heavy lifting objects and no resting tremor.   ***She admits feeling edgy, short fuse, irritability She has tried Buspar 5mg  then up to 10 then 15 mg max without effectiveness She feels tired and overworked Difficulty with helping with her mother for her medical care Her husband is not always able to help and this can cause more stress for her to micromanage  ***Difficulty with mother - meal planning, not allowing someone to come in the house  Vertigo episode Dizzinses episodes with positional vertigo After laying down she had problem Tried Meclizine ***       12/23/2022   10:47 AM 04/21/2022    9:34 AM 06/17/2021    1:29 PM  Depression screen PHQ 2/9  Decreased Interest 1 0 2  Down, Depressed, Hopeless 1 0 2  PHQ - 2 Score 2 0 4  Altered sleeping 1 1 2   Tired, decreased energy 2 3 2   Change in appetite 1 1 3   Feeling bad or failure about yourself  0 0 1  Trouble concentrating 0 1 2  Moving slowly or fidgety/restless 0 0  0  Suicidal thoughts 0 0 0  PHQ-9 Score 6 6 14   Difficult doing work/chores Not difficult at all Not difficult at all Somewhat difficult       12/23/2022   10:47 AM 04/21/2022    9:35 AM 06/17/2021    1:29 PM 03/24/2020   11:06 AM  GAD 7 : Generalized Anxiety Score  Nervous, Anxious, on Edge 2 0 3 3  Control/stop worrying 2 0 3 3  Worry too much - different things 2 1 3 3   Trouble relaxing 2 1  3 3   Restless 2 1 1 1   Easily annoyed or irritable 2 1 3 3   Afraid - awful might happen 1 0 2 1  Total GAD 7 Score 13 4 18 17   Anxiety Difficulty Somewhat difficult Somewhat difficult Somewhat difficult Somewhat difficult    Social History   Tobacco Use   Smoking status: Never   Smokeless tobacco: Never  Vaping Use   Vaping status: Never Used  Substance Use Topics   Alcohol use: Yes    Alcohol/week: 0.0 - 1.0 standard drinks of alcohol   Drug use: No    Review of Systems Per HPI unless specifically indicated above     Objective:    BP 102/64   Ht 5\' 4"  (1.626 m)   Wt 144 lb (65.3 kg)   LMP 07/15/2015 (Within Days)   BMI 24.72 kg/m   Wt Readings from Last 3 Encounters:  10/26/23 144 lb (65.3 kg)  09/07/23 144 lb 6 oz (65.5 kg)  08/03/23 145 lb (65.8 kg)    Physical Exam Vitals and nursing note reviewed.  Constitutional:      General: She is not in acute distress.    Appearance: Normal appearance. She is well-developed. She is not diaphoretic.     Comments: Well-appearing, comfortable, cooperative  HENT:     Head: Normocephalic and atraumatic.  Eyes:     General:        Right eye: No discharge.        Left eye: No discharge.     Conjunctiva/sclera: Conjunctivae normal.  Cardiovascular:     Rate and Rhythm: Normal rate.  Pulmonary:     Effort: Pulmonary effort is normal.  Skin:    General: Skin is warm and dry.     Findings: No erythema or rash.  Neurological:     Mental Status: She is alert and oriented to person, place, and time.  Psychiatric:        Mood and Affect: Mood normal.        Behavior: Behavior normal.        Thought Content: Thought content normal.     Comments: Well groomed, good eye contact, normal speech and thoughts     Results for orders placed or performed during the hospital encounter of 09/12/21  Covid-19, Flu A+B ([facility])   Specimen: Nasopharyngeal   Naso  Result Value Ref Range   SARS-CoV-2, NAA Not Detected Not Detected    Influenza A, NAA Not Detected Not Detected   Influenza B, NAA Not Detected Not Detected   Test Information: Comment       Assessment & Plan:   Problem List Items Addressed This Visit     Depression, major, recurrent, moderate (HCC)   Relevant Medications   DULoxetine (CYMBALTA) 60 MG capsule   GAD (generalized anxiety disorder) - Primary   Relevant Medications   DULoxetine (CYMBALTA) 60 MG capsule   clonazePAM (KLONOPIN) 0.5 MG tablet  SLE (systemic lupus erythematosus) (HCC)   Other Visit Diagnoses     Anxiety and depression       Relevant Medications   DULoxetine (CYMBALTA) 60 MG capsule        *** Irritability/Agitation Persistent despite trial of Buspar up to 15mg . Cymbalta 60mg  daily provides some benefit for joint pain but not for mood symptoms. -Discontinue Buspar. -Start Clonazepam 0.5mg  up to twice daily as needed for anxiety, with caution regarding potential for dependence and interaction with other medications. -Consider potential benefit of ADHD medications for mood and anxiety regulation in the future.  Benign Paroxysmal Positional Vertigo (BPPV) Reports of dizziness and falls, particularly with position changes. Improvement noted with Epley maneuver. -Continue Epley maneuver at home up to three times daily. -Consider referral to physical therapy if symptoms persist.  General Health Maintenance -Refill Cymbalta 60mg  daily. -Plan for routine blood work at next visit in three months.         No orders of the defined types were placed in this encounter.   Meds ordered this encounter  Medications   DULoxetine (CYMBALTA) 60 MG capsule    Sig: Take 1 capsule (60 mg total) by mouth daily.    Dispense:  90 capsule    Refill:  3   clonazePAM (KLONOPIN) 0.5 MG tablet    Sig: Take 0.5-1 tablets (0.25-0.5 mg total) by mouth 2 (two) times daily as needed for anxiety.    Dispense:  30 tablet    Refill:  2    Follow up plan: Return in about 3 months  (around 01/26/2024) for 3 month fasting lab > 1 week later Annual Physical.  Future labs ordered for 01/24/24   Saralyn Pilar, DO [facility] 10/26/2023, 9:22 AM

## 2023-10-26 NOTE — Patient Instructions (Addendum)
Thank you for coming to the office today.  Refilled Cymbalta 60mg  daily  Sent new rx Clonazepam 0.5mg  twice  daily as needed, can take half or whole, sporadic, may take 30 min to 1 hour to take effect or more, may need to take it in advance or as needed. Can take low dose daily and add to it if need  Return within 1 month if we need to change. Otherwise 3 month follow-up to renew.  [employer] Scientist, water quality Available) [facility]  [address] [place], [place] [address] Phone: [phone]  ----------------------------------------------------------------- THERAPIST ONLY  (No Psychiatry)  [facility] [address] [place], [place] [address] [place] P: [phone]  [facility], [facility] [address] [place], [place] [address] [phone]  [facility]   Address: [address] [place], [place] [address] Hours: Open today  9AM-7PM Phone: [phone]  [facility] [address] [facility]  - [facility] Address: [address] [place], [place] [address] Phone: [phone]  [facility] [place], [place] [address] Phone: [phone]   -------------------------------------------  You have symptoms of Vertigo (Benign Paroxysmal Positional Vertigo) - This is commonly caused by inner ear fluid imbalance, sometimes can be worsened by allergies and sinus symptoms, otherwise it can occur randomly sometimes and we may never discover the exact cause. - To treat this, try the Epley Manuever (see diagrams/instructions below) at home up to 3 times a day for 1-2 weeks or until symptoms resolve  If you develop significant worsening episode with vertigo that does not improve and you get severe headache, loss of vision, arm or leg weakness, slurred speech, or other concerning symptoms please seek immediate medical attention at Emergency Department.  See the next page for images describing  the Epley Manuever.     ----------------------------------------------------------------------------------------------------------------------       Please schedule a Follow-up Appointment to: No follow-ups on file.  If you have any other questions or concerns, please feel free to call the office or send a message through MyChart. You may also schedule an earlier appointment if necessary.  Additionally, you may be receiving a survey about your experience at our office within a few days to 1 week by e-mail or mail. We value your feedback.  Saralyn Pilar, DO [facility], [place]

## 2023-11-22 ENCOUNTER — Other Ambulatory Visit: Payer: Self-pay | Admitting: Neurology

## 2023-11-22 NOTE — Telephone Encounter (Signed)
Requested Prescriptions   Pending Prescriptions Disp Refills   pregabalin (LYRICA) 25 MG capsule [Pharmacy Med Name: PREGABALIN CAP 25MG ] 180 capsule 1    Sig: TAKE 1 CAPSULE TWICE DAILY   Last seen 08/03/23 02/14/24, next appt  Dispenses    Dispensed Days Supply Quantity Provider Pharmacy  PREGABALIN 25MG      CAP 10/27/2023 90 180 each Windell Norfolk, MD [facility]  PREGABALIN 25 MG CAPSULE 10/15/2023 15 30 each Windell Norfolk, [facility] [phone] - B...  PREGABALIN 25MG  CAP 08/04/2023 90 180 capsule Windell Norfolk, MD [facility]  PREGABALIN 25 MG CAPSULE 07/09/2023 15 30 each Windell Norfolk, MD [facility] [phone] - B...  PREGABALIN 25 MG CAPSULE 06/20/2023 15 30 each Windell Norfolk, MD [facility] [phone] - B...  PREGABALIN 25 MG CAPSULE 06/04/2023 15 30 each Windell Norfolk, MD [facility] [phone] - B...  PREGABALIN 25 MG CAPSULE 05/18/2023 15 30 each Windell Norfolk, MD [facility] [phone] - B...  PREGABALIN 25 MG CAPSULE 04/26/2023 15 30 each Windell Norfolk, MD [facility] [phone] - B...  PREGABALIN 25 MG CAPSULE 02/17/2023 10 20 each Windell Norfolk, MD [facility] [phone] - B...  PREGABALIN 50 MG CAPSULE 02/15/2023 30 60 each Windell Norfolk, MD [facility] [phone] - B...      Refill denied

## 2023-12-10 ENCOUNTER — Other Ambulatory Visit: Payer: Self-pay | Admitting: Cardiology

## 2023-12-11 ENCOUNTER — Other Ambulatory Visit: Payer: Self-pay | Admitting: Neurology

## 2023-12-12 ENCOUNTER — Telehealth: Payer: Self-pay

## 2023-12-12 NOTE — Telephone Encounter (Signed)
 Phone note created and message sent via MyChart.  Left message for patient to call the office to confirm Metoprolol  dose and need for refill   Refill request received for Metoprolol  Tartrate 25 mg 1 tab BID.  Documented on 09/07/23 note and current med list that she is taking 37.5 mg BID

## 2023-12-12 NOTE — Telephone Encounter (Signed)
 Left message for patient to call the office to confirm Metoprolol dose and need for refill  Refill request received for Metoprolol Tartrate 25 mg 1 tab BID.  Documented on 09/07/23 note and current med list that she is taking 37.5 mg BID

## 2023-12-13 NOTE — Telephone Encounter (Signed)
 Requested Prescriptions   Pending Prescriptions Disp Refills   pregabalin  (LYRICA ) 25 MG capsule [Pharmacy Med Name: PREGABALIN  25 MG CAPSULE] 30 capsule     Sig: TAKE 1 CAPSULE (25 MG TOTAL) BY MOUTH 2 (TWO) TIMES DAILY   Last seen 08/03/23 Next appt 02/14/24 Dispenses    Dispensed Days Supply Quantity Provider Pharmacy  PREGABALIN  25MG      CAP 10/27/2023 90 180 each Gregg Lek, MD [facility]  PREGABALIN  25 MG CAPSULE 10/15/2023 15 30 each Camara, Amadou, MD [facility] [phone] - B...  PREGABALIN  25MG  CAP 08/04/2023 90 180 capsule Gregg Lek, MD [facility]  PREGABALIN  25 MG CAPSULE 07/09/2023 15 30 each Camara, Amadou, MD [facility] [phone] - B...  PREGABALIN  25 MG CAPSULE 06/20/2023 15 30 each Camara, Amadou, MD [facility] [phone] - B...  PREGABALIN  25 MG CAPSULE 06/04/2023 15 30 each Camara, Amadou, MD [facility] [phone] - B...  PREGABALIN  25 MG CAPSULE 05/18/2023 15 30 each Camara, Amadou, MD [facility] [phone] - B...  PREGABALIN  25 MG CAPSULE 04/26/2023 15 30 each Camara, Amadou, MD [facility] [phone] - B...  PREGABALIN  25 MG CAPSULE 02/17/2023 10 20 each Camara, Amadou, MD [facility] [phone] - B...  PREGABALIN  50 MG CAPSULE 02/15/2023 30 60 each Camara, Amadou, MD [facility] [phone] - B.SABRAS[facility]RA

## 2023-12-22 ENCOUNTER — Encounter: Payer: Self-pay | Admitting: Internal Medicine

## 2023-12-22 ENCOUNTER — Ambulatory Visit: Payer: Self-pay

## 2023-12-22 ENCOUNTER — Ambulatory Visit: Payer: No Typology Code available for payment source | Admitting: Internal Medicine

## 2023-12-22 VITALS — BP 102/68 | Temp 98.0°F | Ht 64.0 in | Wt 141.8 lb

## 2023-12-22 DIAGNOSIS — R140 Abdominal distension (gaseous): Secondary | ICD-10-CM

## 2023-12-22 DIAGNOSIS — R110 Nausea: Secondary | ICD-10-CM | POA: Diagnosis not present

## 2023-12-22 DIAGNOSIS — R1032 Left lower quadrant pain: Secondary | ICD-10-CM | POA: Diagnosis not present

## 2023-12-22 DIAGNOSIS — R197 Diarrhea, unspecified: Secondary | ICD-10-CM | POA: Diagnosis not present

## 2023-12-22 DIAGNOSIS — R52 Pain, unspecified: Secondary | ICD-10-CM

## 2023-12-22 LAB — POC COVID19/FLU A&B COMBO
Covid Antigen, POC: NEGATIVE
Influenza A Antigen, POC: NEGATIVE
Influenza B Antigen, POC: NEGATIVE

## 2023-12-22 NOTE — Telephone Encounter (Signed)
  Chief Complaint: constant moderate lower abd pain Symptoms: diarrhea, bloating, painful to touch, nausea Frequency: Sunday Pertinent Negatives: Patient denies vomiting or radiation to back  Disposition: [] ED /[] Urgent Care (no appt availability in office) / [x] Appointment(In office/virtual)/ []  [facility]/ [] Home Care/ [] Refused Recommended Disposition /[] [facility]/ []  Follow-up with PCP Additional Notes: please review with PCP - no appts with PCP- made appt with Nicki Reaper NP Reason for Disposition  [1] MILD-MODERATE pain AND [2] constant AND [3] present > 2 hours  Answer Assessment - Initial Assessment Questions 1. LOCATION: "Where does it hurt?"      Right and Left lower  2. RADIATION: "Does the pain shoot anywhere else?" (e.g., chest, back)     Across abd men 3. ONSET: "When did the pain begin?" (e.g., minutes, hours or days ago)      Sunday  4. SUDDEN: "Gradual or sudden onset?"     Suddenly  5. PATTERN "Does the pain come and go, or is it constant?"    - If it comes and goes: "How long does it last?" "Do you have pain now?"     (Note: Comes and goes means the pain is intermittent. It goes away completely between bouts.)    - If constant: "Is it getting better, staying the same, or getting worse?"      (Note: Constant means the pain never goes away completely; most serious pain is constant and gets worse.)      *No Answer* 6. SEVERITY: "How bad is the pain?"  (e.g., Scale 1-10; mild, moderate, or severe)    - MILD (1-3): Doesn't interfere with normal activities, abdomen soft and not tender to touch.     - MODERATE (4-7): Interferes with normal activities or awakens from sleep, abdomen tender to touch.     - SEVERE (8-10): Excruciating pain, doubled over, unable to do any normal activities.       6/10 hurts with walking 8. CAUSE: "What do you think is causing the stomach pain?"     *No Answer* 9. RELIEVING/AGGRAVATING FACTORS: "What makes it better or  worse?" (e.g., antacids, bending or twisting motion, bowel movement)     *No Answer* 10. OTHER SYMPTOMS: "Do you have any other symptoms?" (e.g., back pain, diarrhea, fever, urination pain, vomiting)       Bloating gas diarrhea, nausea 11. PREGNANCY: "Is there any chance you are pregnant?" "When was your last menstrual period?"       *No Answer*  Protocols used: Abdominal Pain - [facility]

## 2023-12-22 NOTE — Patient Instructions (Signed)
 Viral Gastroenteritis, Adult  Viral gastroenteritis is also known as the stomach flu. This condition may affect your stomach, your small intestine, and your large intestine. It can cause sudden watery poop (diarrhea), fever, and vomiting. This condition is caused by certain germs (viruses). These germs can be passed from person to person very easily (are contagious). Having watery poop and vomiting can make you feel weak and cause you to not have enough water in your body (get dehydrated). This can make you tired and thirsty, make you have a dry mouth, and make it so you pee (urinate) less often. It is important to replace the fluids that you lose from having watery poop and vomiting. What are the causes? You can get sick by catching germs from other people. You can also get sick by: Eating food, drinking water, or touching a surface that has the germs on it (is contaminated). Sharing utensils or other personal items with a person who is sick. What increases the risk? Having a weak body defense system (immune system). Living with one or more children who are younger than 2 years. Living in a nursing home. Going on cruise ships. What are the signs or symptoms? Symptoms of this condition start suddenly. Symptoms may last for a few days or for as long as a week. Common symptoms include: Watery poop. Vomiting. Other symptoms include: Fever. Headache. Feeling tired (fatigue). Pain in the belly (abdomen). Chills. Feeling weak. Feeling like you may vomit (nauseous). Muscle aches. Not feeling hungry. How is this treated? This condition typically goes away on its own. The focus of treatment is to replace the fluids that you lose. This condition may be treated with: An ORS (oral rehydration solution). This is a drink that helps you replace fluids and minerals your body lost. It is sold at pharmacies and stores. Medicines to help with your symptoms. Probiotic supplements to reduce symptoms of  watery poop. Fluids given through an IV tube, if needed. Older adults and people with other diseases or a weak body defense system are at higher risk for not having enough water in the body. Follow these instructions at home: Eating and drinking  Take an ORS as told by your doctor. Drink clear fluids in small amounts as you are able. Clear fluids include: Water. Ice chips. Fruit juice that has water added to it (is diluted). Low-calorie sports drinks. Drink enough fluid to keep your pee (urine) pale yellow. Eat small amounts of healthy foods every 3-4 hours as you are able. This may include whole grains, fruits, vegetables, lean meats, and yogurt. Avoid fluids that have a lot of sugar or caffeine in them. This includes energy drinks, sports drinks, and soda. Avoid spicy or fatty foods. Avoid alcohol. General instructions  Wash your hands often. This is very important after you have watery poop or you vomit. If you cannot use soap and water, use hand sanitizer. Make sure that all people in your home wash their hands well and often. Take over-the-counter and prescription medicines only as told by your doctor. Rest at home while you get better. Watch your condition for any changes. Take a warm bath to help with any burning or pain from having watery poop. Keep all follow-up visits. Contact a doctor if: You cannot keep fluids down. Your symptoms get worse. You have new symptoms. You feel light-headed or dizzy. You have muscle cramps. Get help right away if: You have chest pain. You have trouble breathing, or you are breathing very fast.  You have a fast heartbeat. You feel very weak or you faint. You have a very bad headache, a stiff neck, or both. You have a rash. You have very bad pain, cramping, or bloating in your belly. Your skin feels cold and clammy. You feel mixed up (confused). You have pain when you pee. You have signs of not having enough water in the body, such  as: Dark pee, hardly any pee, or no pee. Cracked lips. Dry mouth. Sunken eyes. Feeling very sleepy. Feeling weak. You have signs of bleeding, such as: You see blood in your vomit. Your vomit looks like coffee grounds. You have bloody or black poop or poop that looks like tar. These symptoms may be an emergency. Get help right away. Call 911. Do not wait to see if the symptoms will go away. Do not drive yourself to the hospital. Summary Viral gastroenteritis is also known as the stomach flu. This condition can cause sudden watery poop (diarrhea), fever, and vomiting. These germs can be passed from person to person very easily. Take an ORS (oral rehydration solution) as told by your doctor. This is a drink that is sold at pharmacies and stores. Wash your hands often, especially after having watery poop or vomiting. If you cannot use soap and water, use hand sanitizer. This information is not intended to replace advice given to you by your health care provider. Make sure you discuss any questions you have with your health care provider. Document Revised: 09/21/2021 Document Reviewed: 09/21/2021 Elsevier Patient Education  2024 [employer].

## 2023-12-22 NOTE — Progress Notes (Signed)
Subjective:    Patient ID: Christina Meza, female    DOB: 22-Jun-1966, 58 y.o.   MRN: [id number]  HPI  Discussed the use of AI scribe software for clinical note transcription with the patient, who gave verbal consent to proceed.  The patient presented with a four-day history of gastrointestinal symptoms that began on Sunday. She reported experiencing a runny nose but denied any headache, ear pain, sore throat, cough, or shortness of breath. The patient described symptoms of nausea, loss of appetite, and loose stools, but denied any vomiting. She also reported abdominal pain but no fever since Sunday. There were no reported urinary or vaginal symptoms.  The patient mentioned a colleague at work who recently recovered from the norovirus. She also reported feeling gassy and bloated, with the diarrhea not showing any signs of improvement. The patient denied any known history of diverticulosis or diverticulitis. Her last colonoscopy, conducted five years ago, was reported as normal.   Review of Systems   Past Medical History:  Diagnosis Date   Allergy    Arthritis    Asthma    Clostridium difficile infection    2015x2 , 2016x1   Depression    Diastolic dysfunction    a. TTE 1/18: EF 50-55%, no RWMA, Gr1DD, mildly dilated aortic root of 3.4 cm, mildly dilated ascending aorta of 3.7 cm, mild mitral regurgitation, normal size left atrium, normal RV systolic function, PASP normal   Genital warts    GERD (gastroesophageal reflux disease)    Inappropriate sinus tachycardia (HCC)    a. 2010 Nuc Stress test: no ischemia/infarct;  b. 10/2013 Echo: Nl LV fxn, mild MR/TR.   Migraine    Palpitations    Prolonged QT interval    SLE (systemic lupus erythematosus) (HCC)    Stomach ulcer     Current Outpatient Medications  Medication Sig Dispense Refill   albuterol (VENTOLIN HFA) 108 (90 Base) MCG/ACT inhaler TAKE 2 PUFFS BY MOUTH EVERY 6 HOURS AS NEEDED FOR WHEEZE OR SHORTNESS OF BREATH 8.5  each 2   amLODipine (NORVASC) 2.5 MG tablet Take 2.5 mg by mouth daily.     Belimumab 200 MG/ML SOAJ Inject 200 mg into the skin.     Biotin 5000 MCG TABS Take by mouth.     Cholecalciferol (VITAMIN D3) 3000 units TABS Take by mouth daily.     clonazePAM (KLONOPIN) 0.5 MG tablet Take 0.5-1 tablets (0.25-0.5 mg total) by mouth 2 (two) times daily as needed for anxiety. 30 tablet 2   diclofenac sodium (VOLTAREN) 1 % GEL Apply 1 application topically as needed.     DULoxetine (CYMBALTA) 60 MG capsule Take 1 capsule (60 mg total) by mouth daily. 90 capsule 3   estradiol (CLIMARA - DOSED IN MG/24 HR) 0.025 mg/24hr patch APPLY 1 PATCH ON THE SKIN  ONCE A WEEK     folic acid (FOLVITE) 1 MG tablet Take 1 mg by mouth daily.     hydroxychloroquine (PLAQUENIL) 200 MG tablet Take 200 mg by mouth 2 (two) times daily. Alternating 200 mg once a day and next day twice a day.     metoprolol tartrate (LOPRESSOR) 25 MG tablet Take 1.5 tablets (37.5 mg total) by mouth 2 (two) times daily. 180 tablet 3   Multiple Vitamins-Minerals (MULTIVITAMIN WITH MINERALS) tablet Take 1 tablet by mouth every morning.     mupirocin ointment (BACTROBAN) 2 % Apply 1 Application topically 2 (two) times daily. For 1-2 weeks 22 g 0   naproxen sodium (  ALEVE) 220 MG tablet Take 1 tablet (220 mg total) by mouth at bedtime.     pregabalin (LYRICA) 25 MG capsule TAKE 1 CAPSULE (25 MG TOTAL) BY MOUTH 2 (TWO) TIMES DAILY 30 capsule 5   Probiotic Product (PROBIOTIC DAILY PO) Take 1 tablet by mouth daily.      progesterone (PROMETRIUM) 100 MG capsule Take 100 mg by mouth at bedtime.     rOPINIRole (REQUIP) 1 MG tablet Take 1 tablet (1 mg total) by mouth at bedtime. 90 tablet 3   valACYclovir (VALTREX) 500 MG tablet Take 500 mg by mouth at bedtime.     No current facility-administered medications for this visit.    Allergies  Allergen Reactions   Lansoprazole Swelling and Hives    Lips and tongue   Celebrex [Celecoxib] Other (See  Comments) and Itching    Recommended not taking due to a stomach issue.    Sulfa Antibiotics Itching and Hives   Sulfamethoxazole-Trimethoprim Itching and Hives   Furosemide Itching and Hives    Family History  Problem Relation Age of Onset   Arthritis Mother    Bipolar disorder Mother    Depression Mother    Mental illness Mother    Hypertension Maternal Grandmother    Hyperlipidemia Maternal Grandfather    Heart disease Maternal Grandfather    Stroke Maternal Grandfather    Hypertension Maternal Grandfather    Colon cancer Paternal Grandfather    Breast cancer Neg Hx     Social History   Socioeconomic History   Marital status: Married    Spouse name: Not on file   Number of children: Not on file   Years of education: Not on file   Highest education level: Master's degree (e.g., MA, MS, MEng, MEd, MSW, MBA)  Occupational History   Not on file  Tobacco Use   Smoking status: Never   Smokeless tobacco: Never  Vaping Use   Vaping status: Never Used  Substance and Sexual Activity   Alcohol use: Yes    Alcohol/week: 0.0 - 1.0 standard drinks of alcohol   Drug use: No   Sexual activity: Yes    Partners: Male  Other Topics Concern   Not on file  Social History Narrative   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   Social Drivers of Corporate investment banker Strain: Low Risk  (10/26/2023)   Overall Financial Resource Strain (CARDIA)    Difficulty of Paying Living Expenses: Not hard at all  Food Insecurity: No Food Insecurity (10/26/2023)   Hunger Vital Sign    Worried About Running Out of Food in the Last Year: Never true    Ran Out of Food in the Last Year: Never true  Transportation Needs: No Transportation Needs (10/26/2023)   PRAPARE - Administrator, Civil Service (Medical): No    Lack of Transportation (Non-Medical): No  Physical Activity: Insufficiently Active (10/26/2023)   Exercise Vital Sign    Days of Exercise per Week: 2 days    Minutes of Exercise per Session:  30 min  Stress: Stress Concern Present (10/26/2023)   [employer] of Occupational Health - Occupational Stress Questionnaire    Feeling of Stress : To some extent  Social Connections: Unknown (10/26/2023)   Social Connection and Isolation Panel [NHANES]    Frequency of Communication with Friends and Family: Once a week    Frequency of Social Gatherings with Friends and Family: Patient declined    Attends Religious Services: 1 to 4 times per  year    Active Member of Clubs or Organizations: No    Attends Banker Meetings: Not on file    Marital Status: Married  Intimate Partner Violence: Not At Risk (03/03/2023)   Received [facility]   Humiliation, Afraid, Rape, and Kick questionnaire    Fear of Current or Ex-Partner: No    Emotionally Abused: No    Physically Abused: No    Sexually Abused: No     Constitutional: Denies fever, malaise, fatigue, headache or abrupt weight changes.  HEENT: Pt reports runny nose. Denies eye pain, eye redness, ear pain, ringing in the ears, wax buildup, nasal congestion, bloody nose, or sore throat. Respiratory: Denies difficulty breathing, shortness of breath, cough or sputum production.   Cardiovascular: Denies chest pain, chest tightness, palpitations or swelling in the hands or feet.  Gastrointestinal: Pt reports abdominal pain, nausea and loose stools. Denies bloating, constipation, or blood in the stool.  GU: Denies urgency, frequency, pain with urination, burning sensation, blood in urine, odor or discharge. Musculoskeletal: Denies decrease in range of motion, difficulty with gait, muscle pain or joint pain and swelling.  Skin: Denies redness, rashes, lesions or ulcercations.  Neurological: Denies dizziness, difficulty with memory, difficulty with speech or problems with balance and coordination.  Psych: Denies anxiety, depression, SI/HI.  No other specific complaints in a complete review of systems  (except as listed in HPI above).      Objective:   Physical Exam BP 102/68 (BP Location: Left Arm, Patient Position: Sitting, Cuff Size: Normal)   Ht 5\' 4"  (1.626 m)   Wt 141 lb 12.8 oz (64.3 kg)   LMP 07/15/2015 (Within Days)   BMI 24.34 kg/m   Wt Readings from Last 3 Encounters:  10/26/23 144 lb (65.3 kg)  09/07/23 144 lb 6 oz (65.5 kg)  08/03/23 145 lb (65.8 kg)    General: Appears her stated age, appears unwell but in NAD. Skin: Warm, dry and intact. No rashes noted. HEENT: Head: normal shape and size, no sinus tenderness noted; Eyes: sclera white, no icterus, conjunctiva pink, PERRLA and EOMs intact; Nose: mucosa pink and moist, septum midline; Throat/Mouth: Teeth present, mucosa pink and moist, no exudate, lesions or ulcerations noted.  Neck: No adenopathy noted. Cardiovascular: Normal rate and rhythm. S1,S2 noted.  No murmur, rubs or gallops noted.  Pulmonary/Chest: Normal effort and positive vesicular breath sounds. No respiratory distress. No wheezes, rales or ronchi noted.  Abdomen: Soft and tender in the LLQ.  Hyper active bowel sounds. No distention or masses noted. Liver, spleen and kidneys non palpable. Musculoskeletal: No difficulty with gait.  Neurological: Alert and oriented. Coordination normal.    BMET    Component Value Date/Time   NA 137 01/21/2020 1321   NA 140 12/07/2016 1537   K 4.8 01/21/2020 1321   CL 103 01/21/2020 1321   CO2 28 01/21/2020 1321   GLUCOSE 83 01/21/2020 1321   BUN 22 (H) 01/21/2020 1321   BUN 14 12/07/2016 1537   CREATININE 0.81 01/21/2020 1321   CREATININE 0.89 05/04/2017 0807   CALCIUM 8.7 (L) 01/21/2020 1321   GFRNONAA >60 01/21/2020 1321   GFRNONAA 76 05/04/2017 0807   GFRAA >60 01/21/2020 1321   GFRAA 87 05/04/2017 0807    Lipid Panel     Component Value Date/Time   CHOL 172 05/04/2017 0807   TRIG 163 (H) 05/04/2017 0807   HDL 46 (L) 05/04/2017 0807   CHOLHDL 3.7 05/04/2017 5784  VLDL 33 (H) 05/04/2017 0807    LDLCALC 93 05/04/2017 0807    CBC    Component Value Date/Time   WBC 2.6 (L) 01/21/2020 1321   RBC 3.28 (L) 01/21/2020 1321   HGB 11.0 (L) 01/21/2020 1321   HGB 13.3 12/07/2016 1537   HCT 33.1 (L) 01/21/2020 1321   HCT 40.1 12/07/2016 1537   PLT 189 01/21/2020 1321   PLT 204 12/07/2016 1537   MCV 100.9 (H) 01/21/2020 1321   MCV 91 12/07/2016 1537   MCH 33.5 01/21/2020 1321   MCHC 33.2 01/21/2020 1321   RDW 14.1 01/21/2020 1321   RDW 14.2 12/07/2016 1537   LYMPHSABS 0.5 (L) 01/21/2020 1321   LYMPHSABS 0.8 12/07/2016 1537   MONOABS 0.5 01/21/2020 1321   EOSABS 0.0 01/21/2020 1321   EOSABS 0.0 12/07/2016 1537   BASOSABS 0.0 01/21/2020 1321   BASOSABS 0.0 12/07/2016 1537    Hgb A1C Lab Results  Component Value Date   HGBA1C 5.4 05/04/2017            Assessment & Plan:    Assessment and Plan    Gastroenteritis Symptoms of nausea, loose stools, and abdominal pain for 4 days. Possible exposure to norovirus at work. No fever or vomiting. Negative for COVID and flu. -Check white blood cell count, kidney function, and electrolytes. -Advise against antidiarrheals to allow virus to pass. -Recommend over-the-counter Gas-X for bloating. -Review labs first thing in the morning and consider CT scan if symptoms worsen or do not improve.  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 -Provide work note excusing patient from work until Monday.

## 2023-12-23 ENCOUNTER — Encounter: Payer: Self-pay | Admitting: Internal Medicine

## 2023-12-23 LAB — COMPREHENSIVE METABOLIC PANEL
AG Ratio: 2.2 (calc) (ref 1.0–2.5)
ALT: 20 U/L (ref 6–29)
AST: 26 U/L (ref 10–35)
Albumin: 4.4 g/dL (ref 3.6–5.1)
Alkaline phosphatase (APISO): 99 U/L (ref 37–153)
BUN: 14 mg/dL (ref 7–25)
CO2: 26 mmol/L (ref 20–32)
Calcium: 9.1 mg/dL (ref 8.6–10.4)
Chloride: 107 mmol/L (ref 98–110)
Creat: 0.64 mg/dL (ref 0.50–1.03)
Globulin: 2 g/dL (ref 1.9–3.7)
Glucose, Bld: 94 mg/dL (ref 65–139)
Potassium: 4.5 mmol/L (ref 3.5–5.3)
Sodium: 139 mmol/L (ref 135–146)
Total Bilirubin: 0.5 mg/dL (ref 0.2–1.2)
Total Protein: 6.4 g/dL (ref 6.1–8.1)

## 2023-12-23 LAB — CBC
HCT: 41 % (ref 35.0–45.0)
Hemoglobin: 13.7 g/dL (ref 11.7–15.5)
MCH: 31.4 pg (ref 27.0–33.0)
MCHC: 33.4 g/dL (ref 32.0–36.0)
MCV: 93.8 fL (ref 80.0–100.0)
MPV: 10.8 fL (ref 7.5–12.5)
Platelets: 179 10*3/uL (ref 140–400)
RBC: 4.37 10*6/uL (ref 3.80–5.10)
RDW: 12.4 % (ref 11.0–15.0)
WBC: 3.1 10*3/uL — ABNORMAL LOW (ref 3.8–10.8)

## 2024-01-24 ENCOUNTER — Other Ambulatory Visit: Payer: Self-pay

## 2024-01-24 DIAGNOSIS — Z79899 Other long term (current) drug therapy: Secondary | ICD-10-CM

## 2024-01-24 DIAGNOSIS — F331 Major depressive disorder, recurrent, moderate: Secondary | ICD-10-CM

## 2024-01-24 DIAGNOSIS — F411 Generalized anxiety disorder: Secondary | ICD-10-CM

## 2024-01-24 DIAGNOSIS — E782 Mixed hyperlipidemia: Secondary | ICD-10-CM

## 2024-01-24 DIAGNOSIS — Z0000 Encounter for general adult medical examination without abnormal findings: Secondary | ICD-10-CM

## 2024-01-24 DIAGNOSIS — M328 Other forms of systemic lupus erythematosus: Secondary | ICD-10-CM

## 2024-01-25 LAB — LIPID PANEL
Cholesterol: 173 mg/dL (ref <200)
HDL: 39 mg/dL — ABNORMAL LOW (ref >=50)
LDL Cholesterol (Calc): 94 mg/dL
Non-HDL Cholesterol (Calc): 134 mg/dL — ABNORMAL HIGH (ref <130)
Total CHOL/HDL Ratio: 4.4 (calc) (ref <5.0)
Triglycerides: 278 mg/dL — ABNORMAL HIGH (ref <150)

## 2024-01-25 LAB — COMPLETE METABOLIC PANEL WITH GFR
AG Ratio: 2.2 (calc) (ref 1.0–2.5)
ALT: 19 U/L (ref 6–29)
AST: 23 U/L (ref 10–35)
Albumin: 4.1 g/dL (ref 3.6–5.1)
Alkaline phosphatase (APISO): 86 U/L (ref 37–153)
BUN: 13 mg/dL (ref 7–25)
CO2: 26 mmol/L (ref 20–32)
Calcium: 9.2 mg/dL (ref 8.6–10.4)
Chloride: 106 mmol/L (ref 98–110)
Creat: 0.62 mg/dL (ref 0.50–1.03)
Globulin: 1.9 g/dL (ref 1.9–3.7)
Glucose, Bld: 93 mg/dL (ref 65–99)
Potassium: 4.7 mmol/L (ref 3.5–5.3)
Sodium: 140 mmol/L (ref 135–146)
Total Bilirubin: 0.4 mg/dL (ref 0.2–1.2)
Total Protein: 6 g/dL — ABNORMAL LOW (ref 6.1–8.1)
eGFR: 104 mL/min/{1.73_m2} (ref >=60)

## 2024-01-25 LAB — CBC WITH DIFFERENTIAL/PLATELET
Absolute Lymphocytes: 705 {cells}/uL — ABNORMAL LOW (ref 850–3900)
Absolute Monocytes: 556 {cells}/uL (ref 200–950)
Basophils Absolute: 21 {cells}/uL (ref 0–200)
Basophils Relative: 0.8 %
Eosinophils Absolute: 39 {cells}/uL (ref 15–500)
Eosinophils Relative: 1.5 %
HCT: 39.2 % (ref 35.0–45.0)
Hemoglobin: 13.1 g/dL (ref 11.7–15.5)
MCH: 32.4 pg (ref 27.0–33.0)
MCHC: 33.4 g/dL (ref 32.0–36.0)
MCV: 97 fL (ref 80.0–100.0)
MPV: 10.8 fL (ref 7.5–12.5)
Monocytes Relative: 21.4 %
Neutro Abs: 1279 {cells}/uL — ABNORMAL LOW (ref 1500–7800)
Neutrophils Relative %: 49.2 %
Platelets: 201 10*3/uL (ref 140–400)
RBC: 4.04 10*6/uL (ref 3.80–5.10)
RDW: 13.2 % (ref 11.0–15.0)
Total Lymphocyte: 27.1 %
WBC: 2.6 10*3/uL — ABNORMAL LOW (ref 3.8–10.8)

## 2024-01-25 LAB — HEMOGLOBIN A1C
Hgb A1c MFr Bld: 5.6 %{Hb} (ref <5.7)
Mean Plasma Glucose: 114 mg/dL
eAG (mmol/L): 6.3 mmol/L

## 2024-01-25 LAB — TSH: TSH: 1.85 m[IU]/L (ref 0.40–4.50)

## 2024-01-31 ENCOUNTER — Ambulatory Visit (INDEPENDENT_AMBULATORY_CARE_PROVIDER_SITE_OTHER): Payer: No Typology Code available for payment source | Admitting: Family Medicine

## 2024-01-31 ENCOUNTER — Encounter: Payer: Self-pay | Admitting: Family Medicine

## 2024-01-31 VITALS — BP 112/68 | HR 86 | Ht 64.0 in | Wt 142.0 lb

## 2024-01-31 DIAGNOSIS — M328 Other forms of systemic lupus erythematosus: Secondary | ICD-10-CM | POA: Diagnosis not present

## 2024-01-31 DIAGNOSIS — F331 Major depressive disorder, recurrent, moderate: Secondary | ICD-10-CM

## 2024-01-31 DIAGNOSIS — Z0000 Encounter for general adult medical examination without abnormal findings: Secondary | ICD-10-CM | POA: Diagnosis not present

## 2024-01-31 DIAGNOSIS — J3089 Other allergic rhinitis: Secondary | ICD-10-CM

## 2024-01-31 DIAGNOSIS — G4709 Other insomnia: Secondary | ICD-10-CM

## 2024-01-31 DIAGNOSIS — I4711 Inappropriate sinus tachycardia, so stated: Secondary | ICD-10-CM

## 2024-01-31 MED ORDER — IPRATROPIUM BROMIDE 0.06 % NA SOLN: 2.0000 | Freq: Four times a day (QID) | NASAL | 0 refills | Status: DC

## 2024-01-31 NOTE — Progress Notes (Signed)
 Subjective:    Patient ID: Christina Meza, female    DOB: Dec 08, 1965, 58 y.o.   MRN: [id number]  Christina Meza is a 58 y.o. female presenting on 01/31/2024 for Annual Exam   HPI  Discussed the use of AI scribe software for clinical note transcription with the patient, who gave verbal consent to proceed.  History of Present Illness   Christina Meza is a 58 year old female with lupus who presents for an annual physical exam.  She has a history of lupus, which affects her vaccination schedule. She completed her shingles vaccination series, with the second dose administered in October 2022. She had a mammogram in August and a colonoscopy in 2019, with the next one due in 2029.  She has been experiencing a sore throat for a week and intermittent discomfort in her right ear. She has been using a throat spray and started an allergy pill yesterday, which has helped alleviate some symptoms. No need for medication refills or renewals.  She inquired about the pneumonia shot, noting that her last one was in 2012. She is considering the Prevnar 20 vaccine.  Her recent blood work from January 24, 2024, showed an A1c of 5.6, a slight increase from her previous 5.4, but still within the normal range. Her triglycerides were slightly elevated, though her LDL cholesterol was within a good range. She has not had issues with diabetes or prediabetes historically.  She reports a chronic low white blood cell count, which has been consistent over time, with a recent count of 2.6. Her hemoglobin levels have improved, showing stability in the 13s, indicating no current anemia. Her thyroid function is within normal limits.      Anxiety / Chronic Depression recurrent, moderate Insomnia On SNRI Duloxetine with improvement. Especially improves her chronic pain. Still has anxiety and insomnia however  Has Clonazepam AS NEEDED  SLE / Lupus Followed by [facility] - Dr Oretha Ellis On  medication management.   History Vertigo  Hypertrigerlycidemia - Reports no concerns. 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 BB    Health Maintenance:  Consider Cologuard after 7 years from Colonoscopy 2019     01/31/2024    1:16 PM 12/23/2022   10:47 AM 04/21/2022    9:34 AM  Depression screen PHQ 2/9  Decreased Interest 1 1 0  Down, Depressed, Hopeless 1 1 0  PHQ - 2 Score 2 2 0  Altered sleeping 0 1 1  Tired, decreased energy 1 2 3   Change in appetite 0 1 1  Feeling bad or failure about yourself  0 0 0  Trouble concentrating 1 0 1  Moving slowly or fidgety/restless 0 0 0  Suicidal thoughts 0 0 0  PHQ-9 Score 4 6 6   Difficult doing work/chores Not difficult at all Not difficult at all Not difficult at all       12/23/2022   10:47 AM 04/21/2022    9:35 AM 06/17/2021    1:29 PM 03/24/2020   11:06 AM  GAD 7 : Generalized Anxiety Score  Nervous, Anxious, on Edge 2 0 3 3  Control/stop worrying 2 0 3 3  Worry too much - different things 2 1 3 3   Trouble relaxing 2 1 3 3   Restless 2 1 1 1   Easily annoyed or irritable 2 1 3 3   Afraid - awful might happen 1 0 2 1  Total GAD 7  Score 13 4 18 17   Anxiety Difficulty Somewhat difficult Somewhat difficult Somewhat difficult Somewhat difficult     Past Medical History:  Diagnosis Date   Allergy    Arthritis    Asthma    Clostridium difficile infection    2015x2 , 2016x1   Depression    Diastolic dysfunction    a. TTE 1/18: EF 50-55%, no RWMA, Gr1DD, mildly dilated aortic root of 3.4 cm, mildly dilated ascending aorta of 3.7 cm, mild mitral regurgitation, normal size left atrium, normal RV systolic function, PASP normal   Genital warts    GERD (gastroesophageal reflux disease)    Inappropriate sinus tachycardia (HCC)    a. 2010 Nuc Stress test: no ischemia/infarct;  b. 10/2013 Echo: Nl LV fxn, mild MR/TR.   Migraine     Palpitations    Prolonged QT interval    SLE (systemic lupus erythematosus) (HCC)    Stomach ulcer    Past Surgical History:  Procedure Laterality Date   BREAST BIOPSY Right 03/01/2017   u/s core bx neg ADENOSIS AND FOCAL MICROCYST FORMATION.   BREAST CYST ASPIRATION Left 4-5 yrs ago   NEG   CATARACT EXTRACTION  2008   ENDOSCOPIC VEIN LASER TREATMENT     ESOPHAGOGASTRODUODENOSCOPY N/A 07/31/2021   Procedure: ESOPHAGOGASTRODUODENOSCOPY (EGD);  Surgeon: Toledo, Boykin Nearing, MD;  Location: [facility] ENDOSCOPY;  Service: Gastroenterology;  Laterality: N/A;   UMBILICAL HERNIA REPAIR     vulvar excision for HPV  07/04/2014   Social History   Socioeconomic History   Marital status: Married    Spouse name: Not on file   Number of children: Not on file   Years of education: Not on file   Highest education level: Master's degree (e.g., MA, MS, MEng, MEd, MSW, MBA)  Occupational History   Not on file  Tobacco Use   Smoking status: Never   Smokeless tobacco: Never  Vaping Use   Vaping status: Never Used  Substance and Sexual Activity   Alcohol use: Yes    Alcohol/week: 0.0 - 1.0 standard drinks of alcohol   Drug use: No   Sexual activity: Yes    Partners: Male  Other Topics Concern   Not on file  Social History Narrative   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   Social Drivers of Corporate investment banker Strain: Low Risk  (10/26/2023)   Overall Financial Resource Strain (CARDIA)    Difficulty of Paying Living Expenses: Not hard at all  Food Insecurity: No Food Insecurity (10/26/2023)   Hunger Vital Sign    Worried About Running Out of Food in the Last Year: Never true    Ran Out of Food in the Last Year: Never true  Transportation Needs: No Transportation Needs (10/26/2023)   PRAPARE - Administrator, Civil Service (Medical): No    Lack of Transportation (Non-Medical): No  Physical Activity: Insufficiently Active (10/26/2023)   Exercise Vital Sign    Days of Exercise per Week: 2 days     Minutes of Exercise per Session: 30 min  Stress: Stress Concern Present (10/26/2023)   [employer] of Occupational Health - Occupational Stress Questionnaire    Feeling of Stress : To some extent  Social Connections: Unknown (10/26/2023)   Social Connection and Isolation Panel [NHANES]    Frequency of Communication with Friends and Family: Once a week    Frequency of Social Gatherings with Friends and Family: Patient declined    Attends Religious Services: 1 to 4 times per year  Active Member of Clubs or Organizations: No    Attends Banker Meetings: Not on file    Marital Status: Married  Intimate Partner Violence: Not At Risk (03/03/2023)   [facility]   Humiliation, Afraid, Rape, and Kick questionnaire    Fear of Current or Ex-Partner: No    Emotionally Abused: No    Physically Abused: No    Sexually Abused: No   Family History  Problem Relation Age of Onset   Arthritis Mother    Bipolar disorder Mother    Depression Mother    Mental illness Mother    Hypertension Maternal Grandmother    Hyperlipidemia Maternal Grandfather    Heart disease Maternal Grandfather    Stroke Maternal Grandfather    Hypertension Maternal Grandfather    Colon cancer Paternal Grandfather    Breast cancer Neg Hx    Current Outpatient Medications on File Prior to Visit  Medication Sig   albuterol (VENTOLIN HFA) 108 (90 Base) MCG/ACT inhaler TAKE 2 PUFFS BY MOUTH EVERY 6 HOURS AS NEEDED FOR WHEEZE OR SHORTNESS OF BREATH   amLODipine (NORVASC) 2.5 MG tablet Take 2.5 mg by mouth daily.   Belimumab 200 MG/ML SOAJ Inject 200 mg into the skin.   Biotin 5000 MCG TABS Take by mouth.   Cholecalciferol (VITAMIN D3) 3000 units TABS Take by mouth daily.   diclofenac sodium (VOLTAREN) 1 % GEL Apply 1 application topically as needed.   DULoxetine (CYMBALTA) 60 MG capsule Take 1 capsule (60 mg total) by mouth daily.   estradiol (CLIMARA - DOSED IN MG/24 HR)  0.025 mg/24hr patch APPLY 1 PATCH ON THE SKIN  ONCE A WEEK   folic acid (FOLVITE) 1 MG tablet Take 1 mg by mouth daily.   hydroxychloroquine (PLAQUENIL) 200 MG tablet Take 200 mg by mouth 2 (two) times daily. Alternating 200 mg once a day and next day twice a day.   metoprolol tartrate (LOPRESSOR) 25 MG tablet Take 1.5 tablets (37.5 mg total) by mouth 2 (two) times daily.   Multiple Vitamins-Minerals (MULTIVITAMIN WITH MINERALS) tablet Take 1 tablet by mouth every morning.   naproxen sodium (ALEVE) 220 MG tablet Take 1 tablet (220 mg total) by mouth at bedtime.   pregabalin (LYRICA) 25 MG capsule TAKE 1 CAPSULE (25 MG TOTAL) BY MOUTH 2 (TWO) TIMES DAILY   Probiotic Product (PROBIOTIC DAILY PO) Take 1 tablet by mouth daily.    progesterone (PROMETRIUM) 100 MG capsule Take 100 mg by mouth at bedtime.   rOPINIRole (REQUIP) 1 MG tablet Take 1 tablet (1 mg total) by mouth at bedtime.   valACYclovir (VALTREX) 500 MG tablet Take 500 mg by mouth at bedtime.   clonazePAM (KLONOPIN) 0.5 MG tablet Take 0.5-1 tablets (0.25-0.5 mg total) by mouth 2 (two) times daily as needed for anxiety. (Patient not taking: Reported on 01/31/2024)   mupirocin ointment (BACTROBAN) 2 % Apply 1 Application topically 2 (two) times daily. For 1-2 weeks (Patient not taking: Reported on 01/31/2024)   No current facility-administered medications on file prior to visit.    Review of Systems  Constitutional:  Negative for activity change, appetite change, chills, diaphoresis, fatigue and fever.  HENT:  Negative for congestion and hearing loss.   Eyes:  Negative for visual disturbance.  Respiratory:  Negative for cough, chest tightness, shortness of breath and wheezing.   Cardiovascular:  Negative for chest pain, palpitations and leg swelling.  Gastrointestinal:  Negative for abdominal pain, constipation, diarrhea,  nausea and vomiting.  Genitourinary:  Negative for dysuria, frequency and hematuria.  Musculoskeletal:  Negative for  arthralgias and neck pain.  Skin:  Negative for rash.  Neurological:  Negative for dizziness, weakness, light-headedness, numbness and headaches.  Hematological:  Negative for adenopathy.  Psychiatric/Behavioral:  Negative for behavioral problems, dysphoric mood and sleep disturbance.    Per HPI unless specifically indicated above     Objective:    BP 112/68   Pulse 86   Ht 5\' 4"  (1.626 m)   Wt 142 lb (64.4 kg)   LMP 07/15/2015 (Within Days)   BMI 24.37 kg/m   Wt Readings from Last 3 Encounters:  01/31/24 142 lb (64.4 kg)  12/22/23 141 lb 12.8 oz (64.3 kg)  10/26/23 144 lb (65.3 kg)    Physical Exam Vitals and nursing note reviewed.  Constitutional:      General: She is not in acute distress.    Appearance: She is well-developed. She is not diaphoretic.     Comments: Well-appearing, comfortable, cooperative  HENT:     Head: Normocephalic and atraumatic.  Eyes:     General:        Right eye: No discharge.        Left eye: No discharge.     Conjunctiva/sclera: Conjunctivae normal.     Pupils: Pupils are equal, round, and reactive to light.  Neck:     Thyroid: No thyromegaly.  Cardiovascular:     Rate and Rhythm: Normal rate and regular rhythm.     Pulses: Normal pulses.     Heart sounds: Normal heart sounds. No murmur heard. Pulmonary:     Effort: Pulmonary effort is normal. No respiratory distress.     Breath sounds: Normal breath sounds. No wheezing or rales.  Abdominal:     General: Bowel sounds are normal. There is no distension.     Palpations: Abdomen is soft. There is no mass.     Tenderness: There is no abdominal tenderness.  Musculoskeletal:        General: No tenderness. Normal range of motion.     Cervical back: Normal range of motion and neck supple.     Comments: Upper / Lower Extremities: - Normal muscle tone, strength bilateral upper extremities 5/5, lower extremities 5/5  Lymphadenopathy:     Cervical: No cervical adenopathy.  Skin:    General:  Skin is warm and dry.     Findings: No erythema or rash.  Neurological:     Mental Status: She is alert and oriented to person, place, and time.     Comments: Distal sensation intact to light touch all extremities  Psychiatric:        Mood and Affect: Mood normal.        Behavior: Behavior normal.        Thought Content: Thought content normal.     Comments: Well groomed, good eye contact, normal speech and thoughts     Results for orders placed or performed in visit on 01/24/24  TSH   Collection Time: 01/24/24  8:41 AM  Result Value Ref Range   TSH 1.85 0.40 - 4.50 mIU/L  CBC with Differential/Platelet   Collection Time: 01/24/24  8:41 AM  Result Value Ref Range   WBC 2.6 (L) 3.8 - 10.8 Thousand/uL   RBC 4.04 3.80 - 5.10 Million/uL   Hemoglobin 13.1 11.7 - 15.5 g/dL   HCT 62.1 30.8 - 65.7 %   MCV 97.0 80.0 - 100.0 fL   MCH  32.4 27.0 - 33.0 pg   MCHC 33.4 32.0 - 36.0 g/dL   RDW 57.8 46.9 - 62.9 %   Platelets 201 140 - 400 Thousand/uL   MPV 10.8 7.5 - 12.5 fL   Neutro Abs 1,279 (L) 1,500 - 7,800 cells/uL   Absolute Lymphocytes 705 (L) 850 - 3,900 cells/uL   Absolute Monocytes 556 200 - 950 cells/uL   Eosinophils Absolute 39 15 - 500 cells/uL   Basophils Absolute 21 0 - 200 cells/uL   Neutrophils Relative % 49.2 %   Total Lymphocyte 27.1 %   Monocytes Relative 21.4 %   Eosinophils Relative 1.5 %   Basophils Relative 0.8 %  COMPLETE METABOLIC PANEL WITH GFR   Collection Time: 01/24/24  8:41 AM  Result Value Ref Range   Glucose, Bld 93 65 - 99 mg/dL   BUN 13 7 - 25 mg/dL   Creat 5.28 4.13 - 2.44 mg/dL   eGFR 010 > OR = 60 UV/OZD/6.64Q0   BUN/Creatinine Ratio SEE NOTE: 6 - 22 (calc)   Sodium 140 135 - 146 mmol/L   Potassium 4.7 3.5 - 5.3 mmol/L   Chloride 106 98 - 110 mmol/L   CO2 26 20 - 32 mmol/L   Calcium 9.2 8.6 - 10.4 mg/dL   Total Protein 6.0 (L) 6.1 - 8.1 g/dL   Albumin 4.1 3.6 - 5.1 g/dL   Globulin 1.9 1.9 - 3.7 g/dL (calc)   AG Ratio 2.2 1.0 - 2.5 (calc)    Total Bilirubin 0.4 0.2 - 1.2 mg/dL   Alkaline phosphatase (APISO) 86 37 - 153 U/L   AST 23 10 - 35 U/L   ALT 19 6 - 29 U/L  Lipid panel   Collection Time: 01/24/24  8:41 AM  Result Value Ref Range   Cholesterol 173 <200 mg/dL   HDL 39 (L) > OR = 50 mg/dL   Triglycerides 347 (H) <150 mg/dL   LDL Cholesterol (Calc) 94 mg/dL (calc)   Total CHOL/HDL Ratio 4.4 <5.0 (calc)   Non-HDL Cholesterol (Calc) 134 (H) <130 mg/dL (calc)  Hemoglobin Q2V   Collection Time: 01/24/24  8:41 AM  Result Value Ref Range   Hgb A1c MFr Bld 5.6 <5.7 % of total Hgb   Mean Plasma Glucose 114 mg/dL   eAG (mmol/L) 6.3 mmol/L      Assessment & Plan:   Problem List Items Addressed This Visit     Depression, major, recurrent, moderate (HCC)   Inappropriate sinus tachycardia (HCC)   Insomnia   SLE (systemic lupus erythematosus) (HCC)   Other Visit Diagnoses       Annual physical exam    -  Primary     Seasonal allergic rhinitis due to other allergic trigger       Relevant Medications   ipratropium (ATROVENT) 0.06 % nasal spray        Updated Health Maintenance information Reviewed recent lab results with patient Encouraged improvement to lifestyle with diet and exercise  Rhinitis / Allergy / Upper Respiratory Symptoms Complaints of sore throat and ear discomfort for a week. No signs of infection on examination, but evidence of postnasal drainage and fluid behind the eardrum. Likely sinus related. -Continue Flonase as directed. -Add Atrovent nasal spray as needed for quick relief of congestion and drainage. -Contact office if symptoms worsen or signs of infection develop for possible antibiotic treatment.  Hypertriglyceridemia Slightly elevated triglycerides on recent blood work. LDL cholesterol within normal range. -Monitor diet, focusing on reducing intake of sugars,  starches, and carbs. -Continue to monitor trend on future blood work.  Preventative Health Up to date on most preventative  health measures. Last pneumonia vaccine was in 2012. -Check into Prevnar 20, the new pneumonia vaccine, for possible administration here or at pharmacy. -Consider Cologuard in 2026 for early colon cancer screening (7 years after Colonoscopy, instead of waiting 10)  Chronic Low White Blood Cells Stable and consistent low white blood cell count. No new concerns. -Continue to monitor as per usual.  Stable Hemoglobin Hemoglobin levels have remained stable and within normal range. No current signs of anemia. -Continue to monitor as per usual.  Follow-up Open-ended. Patient to contact office as needed.         No orders of the defined types were placed in this encounter.   Meds ordered this encounter  Medications   ipratropium (ATROVENT) 0.06 % nasal spray    Sig: Place 2 sprays into both nostrils 4 (four) times daily. For up to 5-7 days then stop.    Dispense:  15 mL    Refill:  0     Follow up plan: Return if symptoms worsen or fail to improve.  Saralyn Pilar, DO [facility] 01/31/2024, 9:52 AM

## 2024-01-31 NOTE — Patient Instructions (Addendum)
 Thank you for coming to the office today.   Check into Prevnar-20 new pneumonia vaccine.  Start Atrovent nasal spray decongestant 2 sprays in each nostril up to 4 times daily for 7 days  Keep using / nasal steroid Flonase 2 sprays in each nostril daily for 4-6 weeks, may repeat course seasonally or as needed  If not improving contact and we can order antibiotic.   Please schedule a Follow-up Appointment to: Return if symptoms worsen or fail to improve.  If you have any other questions or concerns, please feel free to call the office or send a message through MyChart. You may also schedule an earlier appointment if necessary.  Additionally, you may be receiving a survey about your experience at our office within a few days to 1 week by e-mail or mail. We value your feedback.  Saralyn Pilar, DO [facility], [place]

## 2024-02-03 ENCOUNTER — Encounter: Payer: Self-pay | Admitting: Family Medicine

## 2024-02-07 ENCOUNTER — Other Ambulatory Visit: Payer: Self-pay | Admitting: Family Medicine

## 2024-02-07 DIAGNOSIS — J3089 Other allergic rhinitis: Secondary | ICD-10-CM

## 2024-02-08 NOTE — Telephone Encounter (Signed)
 Requested medication (s) are due for refill today: No  Requested medication (s) are on the active medication list: Yes  Last refill:  01/31/24  Future visit scheduled: No  Notes to clinic:  See pharmacy request.    Requested Prescriptions  Pending Prescriptions Disp Refills   ipratropium (ATROVENT) 0.06 % nasal spray [Pharmacy Med Name: IPRATROPIUM 0.06% SPRAY]  1    Sig: PLACE 2 SPRAYS INTO BOTH NOSTRILS 4 (FOUR) TIMES DAILY. FOR UP TO 5-7 DAYS THEN STOP.     Off-Protocol Failed - 02/08/2024 12:17 PM      Failed - Medication not assigned to a protocol, review manually.      Passed - Valid encounter within last 12 months    Recent Outpatient Visits           1 month ago Body aches   [place] [facility] [place], Salvadore Oxford, NP   3 months ago GAD (generalized anxiety disorder)   [place] [facility] Smitty Cords, DO   1 year ago Acute non-recurrent frontal sinusitis   [place] [facility] Smitty Cords, DO   1 year ago Intention tremor   [place] [facility] Smitty Cords, DO   1 year ago Vasovagal episode   [place] [facility] Smitty Cords, DO             Off-Protocol Failed - 02/08/2024 12:17 PM      Failed - Medication not assigned to a protocol, review manually.      Passed - Valid encounter within last 12 months    Recent Outpatient Visits           1 month ago Body aches   [place] [facility] [place], Salvadore Oxford, NP   3 months ago GAD (generalized anxiety disorder)   [place] [facility] Smitty Cords, DO   1 year ago Acute non-recurrent frontal sinusitis   [place] [facility] Smitty Cords, DO   1 year ago Intention tremor   [place] [facility] Smitty Cords, DO   1 year ago Vasovagal episode   [place] [facility] [place], Netta Neat, [place]

## 2024-02-14 ENCOUNTER — Encounter: Payer: Self-pay | Admitting: Neurology

## 2024-02-14 ENCOUNTER — Ambulatory Visit (INDEPENDENT_AMBULATORY_CARE_PROVIDER_SITE_OTHER): Payer: No Typology Code available for payment source | Admitting: Neurology

## 2024-02-14 VITALS — BP 106/68 | HR 66 | Ht 64.0 in | Wt 141.0 lb

## 2024-02-14 DIAGNOSIS — G2581 Restless legs syndrome: Secondary | ICD-10-CM

## 2024-02-14 DIAGNOSIS — G629 Polyneuropathy, unspecified: Secondary | ICD-10-CM

## 2024-02-14 DIAGNOSIS — G250 Essential tremor: Secondary | ICD-10-CM

## 2024-02-14 MED ORDER — ROPINIROLE HCL 1 MG PO TABS: 1.0000 mg | ORAL_TABLET | Freq: Every day | ORAL | 3 refills | Status: DC

## 2024-02-14 MED ORDER — PREGABALIN 25 MG PO CAPS: 25.0000 mg | ORAL_CAPSULE | Freq: Two times a day (BID) | ORAL | 3 refills | Status: DC
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
## 2024-02-14 NOTE — Patient Instructions (Addendum)
 Continue current medications  Will obtain Iron studies  Take the Pregabalin at 6 AM and at 6 PM and the Requip at bedtime. Please update me Continue to follow with your doctors Return in 6 months or sooner if worse

## 2024-02-15 ENCOUNTER — Encounter: Payer: Self-pay | Admitting: Neurology

## 2024-02-15 LAB — IRON,TIBC AND FERRITIN PANEL
Ferritin: 140 ng/mL (ref 15–150)
Iron Saturation: 26 % (ref 15–55)
Iron: 68 ug/dL (ref 27–159)
Total Iron Binding Capacity: 266 ug/dL (ref 250–450)
UIBC: 198 ug/dL (ref 131–425)

## 2024-02-26 ENCOUNTER — Observation Stay
Admission: EM | Admit: 2024-02-26 | Discharge: 2024-02-28 | Disposition: A | Discharge status: Home / Self Care | Attending: Internal Medicine | Admitting: Internal Medicine

## 2024-02-26 ENCOUNTER — Emergency Department

## 2024-02-26 ENCOUNTER — Observation Stay

## 2024-02-26 ENCOUNTER — Other Ambulatory Visit: Payer: Self-pay

## 2024-02-26 DIAGNOSIS — R519 Headache, unspecified: Secondary | ICD-10-CM | POA: Insufficient documentation

## 2024-02-26 DIAGNOSIS — E785 Hyperlipidemia, unspecified: Secondary | ICD-10-CM | POA: Diagnosis not present

## 2024-02-26 DIAGNOSIS — I7300 Raynaud's syndrome without gangrene: Secondary | ICD-10-CM | POA: Diagnosis present

## 2024-02-26 DIAGNOSIS — R000 Tachycardia, unspecified: Secondary | ICD-10-CM | POA: Diagnosis present

## 2024-02-26 DIAGNOSIS — R410 Disorientation, unspecified: Secondary | ICD-10-CM

## 2024-02-26 DIAGNOSIS — G4700 Insomnia, unspecified: Secondary | ICD-10-CM | POA: Diagnosis not present

## 2024-02-26 DIAGNOSIS — F331 Major depressive disorder, recurrent, moderate: Secondary | ICD-10-CM | POA: Diagnosis not present

## 2024-02-26 DIAGNOSIS — Z79899 Other long term (current) drug therapy: Secondary | ICD-10-CM | POA: Diagnosis not present

## 2024-02-26 DIAGNOSIS — W19XXXA Unspecified fall, initial encounter: Secondary | ICD-10-CM | POA: Diagnosis not present

## 2024-02-26 DIAGNOSIS — F3341 Major depressive disorder, recurrent, in partial remission: Secondary | ICD-10-CM | POA: Diagnosis present

## 2024-02-26 DIAGNOSIS — M3210 Systemic lupus erythematosus, organ or system involvement unspecified: Secondary | ICD-10-CM | POA: Diagnosis not present

## 2024-02-26 DIAGNOSIS — F411 Generalized anxiety disorder: Secondary | ICD-10-CM | POA: Diagnosis present

## 2024-02-26 DIAGNOSIS — S01511A Laceration without foreign body of lip, initial encounter: Secondary | ICD-10-CM | POA: Insufficient documentation

## 2024-02-26 DIAGNOSIS — J4540 Moderate persistent asthma, uncomplicated: Secondary | ICD-10-CM | POA: Diagnosis not present

## 2024-02-26 DIAGNOSIS — S0990XA Unspecified injury of head, initial encounter: Secondary | ICD-10-CM

## 2024-02-26 DIAGNOSIS — R55 Syncope and collapse: Principal | ICD-10-CM | POA: Diagnosis present

## 2024-02-26 DIAGNOSIS — K219 Gastro-esophageal reflux disease without esophagitis: Secondary | ICD-10-CM | POA: Diagnosis not present

## 2024-02-26 DIAGNOSIS — M329 Systemic lupus erythematosus, unspecified: Secondary | ICD-10-CM | POA: Diagnosis present

## 2024-02-26 DIAGNOSIS — R930 Abnormal findings on diagnostic imaging of skull and head, not elsewhere classified: Secondary | ICD-10-CM

## 2024-02-26 DIAGNOSIS — M059 Rheumatoid arthritis with rheumatoid factor, unspecified: Secondary | ICD-10-CM | POA: Insufficient documentation

## 2024-02-26 DIAGNOSIS — S0003XA Contusion of scalp, initial encounter: Secondary | ICD-10-CM | POA: Diagnosis not present

## 2024-02-26 DIAGNOSIS — R768 Other specified abnormal immunological findings in serum: Secondary | ICD-10-CM | POA: Diagnosis present

## 2024-02-26 DIAGNOSIS — R7689 Other specified abnormal immunological findings in serum: Secondary | ICD-10-CM | POA: Diagnosis present

## 2024-02-26 LAB — COMPREHENSIVE METABOLIC PANEL
ALT: 19 U/L (ref 0–44)
AST: 24 U/L (ref 15–41)
Albumin: 3.3 g/dL — ABNORMAL LOW (ref 3.5–5.0)
Alkaline Phosphatase: 61 U/L (ref 38–126)
Anion gap: 3 — ABNORMAL LOW (ref 5–15)
BUN: 23 mg/dL — ABNORMAL HIGH (ref 6–20)
CO2: 25 mmol/L (ref 22–32)
Calcium: 8.3 mg/dL — ABNORMAL LOW (ref 8.9–10.3)
Chloride: 105 mmol/L (ref 98–111)
Creatinine, Ser: 0.6 mg/dL (ref 0.44–1.00)
GFR, Estimated: >60 mL/min (ref >60)
Glucose, Bld: 105 mg/dL — ABNORMAL HIGH (ref 70–99)
Potassium: 4.1 mmol/L (ref 3.5–5.1)
Sodium: 133 mmol/L — ABNORMAL LOW (ref 135–145)
Total Bilirubin: 0.6 mg/dL (ref 0.0–1.2)
Total Protein: 5.7 g/dL — ABNORMAL LOW (ref 6.5–8.1)

## 2024-02-26 LAB — DIFFERENTIAL
Abs Immature Granulocytes: 0.01 10*3/uL (ref 0.00–0.07)
Basophils Absolute: 0 10*3/uL (ref 0.0–0.1)
Basophils Relative: 0 %
Eosinophils Absolute: 0 10*3/uL (ref 0.0–0.5)
Eosinophils Relative: 1 %
Immature Granulocytes: 0 %
Lymphocytes Relative: 10 %
Lymphs Abs: 0.4 10*3/uL — ABNORMAL LOW (ref 0.7–4.0)
Monocytes Absolute: 0.6 10*3/uL (ref 0.1–1.0)
Monocytes Relative: 12 %
Neutro Abs: 3.4 10*3/uL (ref 1.7–7.7)
Neutrophils Relative %: 77 %

## 2024-02-26 LAB — APTT: aPTT: 28 s (ref 24–36)

## 2024-02-26 LAB — PROTIME-INR
INR: 0.9 (ref 0.8–1.2)
Prothrombin Time: 12.8 s (ref 11.4–15.2)

## 2024-02-26 LAB — CBC
HCT: 35.2 % — ABNORMAL LOW (ref 36.0–46.0)
Hemoglobin: 11.7 g/dL — ABNORMAL LOW (ref 12.0–15.0)
MCH: 32.3 pg (ref 26.0–34.0)
MCHC: 33.2 g/dL (ref 30.0–36.0)
MCV: 97.2 fL (ref 80.0–100.0)
Platelets: 161 10*3/uL (ref 150–400)
RBC: 3.62 MIL/uL — ABNORMAL LOW (ref 3.87–5.11)
RDW: 13.9 % (ref 11.5–15.5)
WBC: 4.4 10*3/uL (ref 4.0–10.5)
nRBC: 0 % (ref 0.0–0.2)

## 2024-02-26 LAB — ETHANOL: Alcohol, Ethyl (B): <10 mg/dL (ref <10)

## 2024-02-26 LAB — TROPONIN I (HIGH SENSITIVITY): Troponin I (High Sensitivity): 14 ng/L (ref <18)

## 2024-02-26 LAB — CBG MONITORING, ED: Glucose-Capillary: 68 mg/dL — ABNORMAL LOW (ref 70–99)

## 2024-02-26 MED ORDER — VALACYCLOVIR HCL 500 MG PO TABS
500.0000 mg | ORAL_TABLET | Freq: Every day | ORAL | Status: DC
  Administered 2024-02-26 – 2024-02-27 (×2): 500 mg via ORAL
  Filled 2024-02-26 (×3): qty 1

## 2024-02-26 MED ORDER — ONDANSETRON HCL 4 MG/2ML IJ SOLN
4.0000 mg | Freq: Once | INTRAMUSCULAR | Status: AC
  Administered 2024-02-26: 4 mg via INTRAVENOUS
  Filled 2024-02-26: qty 2

## 2024-02-26 MED ORDER — DULOXETINE HCL 30 MG PO CPEP
60.0000 mg | ORAL_CAPSULE | Freq: Every day | ORAL | Status: DC
  Administered 2024-02-27 – 2024-02-28 (×2): 60 mg via ORAL
  Filled 2024-02-26: qty 1
  Filled 2024-02-26: qty 2

## 2024-02-26 MED ORDER — FOLIC ACID 1 MG PO TABS
1.0000 mg | ORAL_TABLET | Freq: Every day | ORAL | Status: DC
  Administered 2024-02-27 – 2024-02-28 (×2): 1 mg via ORAL
  Filled 2024-02-26 (×2): qty 1

## 2024-02-26 MED ORDER — SENNOSIDES-DOCUSATE SODIUM 8.6-50 MG PO TABS: 1.0000 | ORAL_TABLET | Freq: Every evening | ORAL | Status: DC | PRN

## 2024-02-26 MED ORDER — HYDROXYCHLOROQUINE SULFATE 200 MG PO TABS: 200.0000 mg | ORAL_TABLET | Freq: Two times a day (BID) | ORAL | Status: DC

## 2024-02-26 MED ORDER — DICLOFENAC SODIUM 1 % EX GEL: 2.0000 g | Freq: Four times a day (QID) | CUTANEOUS | Status: DC | PRN

## 2024-02-26 MED ORDER — ACETAMINOPHEN 10 MG/ML IV SOLN: 1000.0000 mg | Freq: Four times a day (QID) | INTRAVENOUS | Status: AC | PRN

## 2024-02-26 MED ORDER — ROPINIROLE HCL 1 MG PO TABS
1.0000 mg | ORAL_TABLET | Freq: Every day | ORAL | Status: DC
  Administered 2024-02-26 – 2024-02-27 (×2): 1 mg via ORAL
  Filled 2024-02-26 (×3): qty 1

## 2024-02-26 MED ORDER — PROGESTERONE MICRONIZED 100 MG PO CAPS
100.0000 mg | ORAL_CAPSULE | Freq: Every day | ORAL | Status: DC
  Administered 2024-02-26 – 2024-02-27 (×2): 100 mg via ORAL
  Filled 2024-02-26 (×3): qty 1

## 2024-02-26 MED ORDER — IOHEXOL 350 MG/ML SOLN
75.0000 mL | Freq: Once | INTRAVENOUS | Status: AC | PRN
  Administered 2024-02-26: 75 mL via INTRAVENOUS

## 2024-02-26 MED ORDER — ALBUTEROL SULFATE HFA 108 (90 BASE) MCG/ACT IN AERS: 2.0000 | INHALATION_SPRAY | Freq: Four times a day (QID) | RESPIRATORY_TRACT | Status: DC | PRN

## 2024-02-26 MED ORDER — CLONAZEPAM 0.5 MG PO TABS: 0.5000 mg | ORAL_TABLET | Freq: Two times a day (BID) | ORAL | Status: DC | PRN

## 2024-02-26 MED ORDER — HYDROXYCHLOROQUINE SULFATE 200 MG PO TABS
200.0000 mg | ORAL_TABLET | ORAL | Status: DC
  Administered 2024-02-26 – 2024-02-28 (×2): 200 mg via ORAL
  Filled 2024-02-26 (×2): qty 1

## 2024-02-26 MED ORDER — ACETAMINOPHEN 650 MG RE SUPP: 650.0000 mg | Freq: Four times a day (QID) | RECTAL | Status: DC | PRN

## 2024-02-26 MED ORDER — METOCLOPRAMIDE HCL 5 MG/ML IJ SOLN
10.0000 mg | Freq: Once | INTRAMUSCULAR | Status: AC
  Administered 2024-02-26: 10 mg via INTRAVENOUS
  Filled 2024-02-26: qty 2

## 2024-02-26 MED ORDER — DEXTROSE 50 % IV SOLN
1.0000 | Freq: Once | INTRAVENOUS | Status: AC
  Administered 2024-02-26: 50 mL via INTRAVENOUS
  Filled 2024-02-26: qty 50

## 2024-02-26 MED ORDER — ALBUTEROL SULFATE (2.5 MG/3ML) 0.083% IN NEBU
2.5000 mg | INHALATION_SOLUTION | Freq: Four times a day (QID) | RESPIRATORY_TRACT | Status: DC | PRN
  Filled 2024-02-26: qty 3

## 2024-02-26 MED ORDER — MORPHINE SULFATE (PF) 2 MG/ML IV SOLN
2.0000 mg | INTRAVENOUS | Status: DC | PRN
  Administered 2024-02-26 – 2024-02-27 (×2): 2 mg via INTRAVENOUS
  Filled 2024-02-26 (×2): qty 1

## 2024-02-26 MED ORDER — ACETAMINOPHEN 325 MG PO TABS: 650.0000 mg | ORAL_TABLET | Freq: Four times a day (QID) | ORAL | Status: DC | PRN

## 2024-02-26 MED ORDER — ACETAMINOPHEN 325 MG PO TABS
650.0000 mg | ORAL_TABLET | Freq: Four times a day (QID) | ORAL | Status: DC | PRN
  Administered 2024-02-27 – 2024-02-28 (×4): 650 mg via ORAL
  Filled 2024-02-26 (×5): qty 2

## 2024-02-26 MED ORDER — SODIUM CHLORIDE 0.9% FLUSH
3.0000 mL | Freq: Once | INTRAVENOUS | Status: AC
  Administered 2024-02-26: 3 mL via INTRAVENOUS

## 2024-02-26 MED ORDER — ACETAMINOPHEN 10 MG/ML IV SOLN
1000.0000 mg | Freq: Four times a day (QID) | INTRAVENOUS | Status: DC
  Administered 2024-02-26: 1000 mg via INTRAVENOUS
  Filled 2024-02-26 (×3): qty 100

## 2024-02-26 MED ORDER — ONDANSETRON HCL 4 MG/2ML IJ SOLN: 4.0000 mg | Freq: Four times a day (QID) | INTRAMUSCULAR | Status: DC | PRN

## 2024-02-26 MED ORDER — ACETAMINOPHEN 325 MG RE SUPP: 650.0000 mg | Freq: Four times a day (QID) | RECTAL | Status: DC | PRN

## 2024-02-26 MED ORDER — HYDROXYCHLOROQUINE SULFATE 200 MG PO TABS
200.0000 mg | ORAL_TABLET | ORAL | Status: DC
  Administered 2024-02-27: 200 mg via ORAL
  Filled 2024-02-26 (×2): qty 1

## 2024-02-26 MED ORDER — DICLOFENAC SODIUM 1 % TD GEL: 2.0000 g | TRANSDERMAL | Status: DC | PRN

## 2024-02-26 MED ORDER — ONDANSETRON HCL 4 MG PO TABS: 4.0000 mg | ORAL_TABLET | Freq: Four times a day (QID) | ORAL | Status: DC | PRN

## 2024-02-26 MED ORDER — PREGABALIN 25 MG PO CAPS
25.0000 mg | ORAL_CAPSULE | Freq: Two times a day (BID) | ORAL | Status: DC
  Administered 2024-02-26 – 2024-02-28 (×4): 25 mg via ORAL
  Filled 2024-02-26 (×4): qty 1

## 2024-02-26 MED ORDER — METOPROLOL TARTRATE 25 MG PO TABS
37.5000 mg | ORAL_TABLET | Freq: Two times a day (BID) | ORAL | Status: DC
  Administered 2024-02-26 – 2024-02-28 (×3): 37.5 mg via ORAL
  Filled 2024-02-26 (×4): qty 2

## 2024-02-26 MED ORDER — SODIUM CHLORIDE 0.9% FLUSH
3.0000 mL | Freq: Two times a day (BID) | INTRAVENOUS | Status: DC
Start: 2024-02-26 — End: 2024-02-28
  Administered 2024-02-26 – 2024-02-28 (×4): 3 mL via INTRAVENOUS

## 2024-02-26 NOTE — Progress Notes (Signed)
 Code Stroke cart activated in CT @1340 . LKWT 1245. Pt reports HA and nausea s/p fall. Left-sided weakness noted on exam. Initial NIHSS 5 with bedside RN. mRS 0. No blood thinners.  Dr. Amada Jupiter paged @1341  and in CT @1344 .  NCCT negative. CTA ordered. No TNK at this time. TSRN dismissed by Dr. Amada Jupiter @1404 . Telestroke Charity fundraiser

## 2024-02-26 NOTE — ED Notes (Signed)
 Pt left side of face appears slightly swollen pt speaking in full clear sentences reports pain is on left side of face pt noted to have minor healing laceration to top lip slight bruising noted non labored resps noted pt swallowing water without apparent difficulty

## 2024-02-26 NOTE — ED Notes (Signed)
 Pt does not remember her fall today, is stating she has been nauseated today with a headache. Per husband headache started after fall, does say she vomited today, which is not unusual for her.

## 2024-02-26 NOTE — ED Notes (Signed)
 Dr. Marisa Severin visualized pt on the way to CT.

## 2024-02-26 NOTE — Progress Notes (Signed)
 Responded to code stroke paged-medical team in room.no needs expressed at this time. Please page chaplain services back if needs arise

## 2024-02-26 NOTE — ED Notes (Signed)
 Code stroke called at 1:30

## 2024-02-26 NOTE — Discharge Instructions (Signed)
Suture out in 5 days

## 2024-02-26 NOTE — Assessment & Plan Note (Signed)
 Secondary to head trauma from syncopal at that Patient has left-sided forehead ecchymosis with developing left cheek and lip ecchymosis secondary to mechanical fall with suspected syncopal event Acetaminophen 1000 mg IV every 6 hours as needed for headache, 1 day ordered Morphine 2 mg IV every 4 hours as needed for severe pain, moderate pain for 20 hours ordered Patient states that at the peak the pain was an 8/10 and after the IV acetaminophen, the pain is a 6/10. Goal pain control should decrease to 3-4/10

## 2024-02-26 NOTE — Assessment & Plan Note (Signed)
 Etiology workup in progress Fall precaution Continue cardiac monitoring Neurology has been consulted by EDP Complete echo on admission EEG and MRI routine ordered for tomorrow, 02/27/2024

## 2024-02-26 NOTE — Assessment & Plan Note (Signed)
 Home duloxetine 60 mg daily resumed

## 2024-02-26 NOTE — ED Triage Notes (Signed)
 Pt husband sts that pt fell this AM and has been confused since the fall. Pt endorses that she has a headache. Pt is A/O to person and place.

## 2024-02-26 NOTE — ED Provider Notes (Signed)
 [facility] Provider Note    Event Date/Time   First MD Initiated Contact with Patient 02/26/24 1352     (approximate)   History   Fall   HPI  Christina Meza is a 58 y.o. female who comes in with concerns for stroke.  Patient is last known well was at 1245 when she had collapsed to the ground.  Unclear why she collapsed.  Patient had left-sided weakness on evaluation in triage therefore stroke code was called.  Patient does not remember what happened.  Tdap 2022  Physical Exam   Triage Vital Signs: ED Triage Vitals [02/26/24 1332]  Encounter Vitals Group     BP 111/71     Systolic BP Percentile      Diastolic BP Percentile      Pulse Rate (!) 102     Resp 18     Temp 98.5 F (36.9 C)     Temp src      SpO2 98 %     Weight      Height      Head Circumference      Peak Flow      Pain Score 7     Pain Loc      Pain Education      Exclude from Growth Chart     Most recent vital signs: Vitals:   02/26/24 1332  BP: 111/71  Pulse: (!) 102  Resp: 18  Temp: 98.5 F (36.9 C)  SpO2: 98%     General: Awake, no distress.  CV:  Good peripheral perfusion.  Resp:  Normal effort.  Abd:  No distention.  Soft and nontender Other:  Patient has bruising noted on her left cheek as well as some dried blood on her lip.  Patient appears confused.  Cranial nerves appear grossly intact but seems globally weak.   ED Results / Procedures / Treatments   Labs (all labs ordered are listed, but only abnormal results are displayed) Labs Reviewed  CBG MONITORING, ED - Abnormal; Notable for the following components:      Result Value   Glucose-Capillary 68 (*)    All other components within normal limits  PROTIME-INR  APTT  CBC  DIFFERENTIAL  COMPREHENSIVE METABOLIC PANEL  ETHANOL  I-STAT CREATININE, ED     EKG  My interpretation of EKG:  Sinus tachycardia rate of 101 without any ST elevation or T wave versions, normal  intervals  RADIOLOGY I have reviewed the ct personally and interpreted no evidence of intercranial hemorrhage   PROCEDURES:  Critical Care performed: Yes, see critical care procedure note(s)  .Critical Care  Performed by: Concha Se, MD Authorized by: Concha Se, MD   Critical care provider statement:    Critical care time (minutes):  30   Critical care was necessary to treat or prevent imminent or life-threatening deterioration of the following conditions:  CNS failure or compromise   Critical care was time spent personally by me on the following activities:  Development of treatment plan with patient or surrogate, discussions with consultants, evaluation of patient's response to treatment, examination of patient, ordering and review of laboratory studies, ordering and review of radiographic studies, ordering and performing treatments and interventions, pulse oximetry, re-evaluation of patient's condition and review of old charts .1-3 Lead EKG Interpretation  Performed by: Concha Se, MD Authorized by: Concha Se, MD     Interpretation: abnormal     ECG rate:  100  ECG rate assessment: tachycardic     Rhythm: sinus tachycardia     Ectopy: none     Conduction: normal   .Laceration Repair  Date/Time: 02/26/2024 2:40 PM  Performed by: Concha Se, MD Authorized by: Concha Se, MD   Consent:    Consent obtained:  Verbal   Consent given by:  Patient   Alternatives discussed:  No treatment Anesthesia:    Anesthesia method:  None Laceration details:    Location:  Lip   Lip location:  Lower exterior lip   Length (cm):  1 Treatment:    Area cleansed with:  Chlorhexidine   Amount of cleaning:  Standard   Irrigation volume:  20cc   Irrigation method:  Syringe   Visualized foreign bodies/material removed: no   Skin repair:    Repair method:  Sutures   Suture size:  6-0   Suture material:  Prolene   Number of sutures:  1 Approximation:    Approximation:   Close Repair type:    Repair type:  Simple Post-procedure details:    Dressing:  Open (no dressing)   Procedure completion:  Tolerated well, no immediate complications    MEDICATIONS ORDERED IN ED: Medications  sodium chloride flush (NS) 0.9 % injection 3 mL (has no administration in time range)  dextrose 50 % solution 50 mL (has no administration in time range)     IMPRESSION / MDM / ASSESSMENT AND PLAN / ED COURSE  I reviewed the triage vital signs and the nursing notes.   Patient's presentation is most consistent with acute presentation with potential threat to life or bodily function.   Patient comes in with a fall, left-sided weakness stroke code called in triage.  Symptoms seem to be resolving.  Suspect more likely concussion.  She still seem very confused.  Unclear why this happened and unclear if syncopal episode.  EKG, cardiac markers were ordered.  CT head and CTA were reassuring.  Discussed with neurology Dr. Amada Jupiter and will admit for syncopal workup, EEG, monitoring given the concern for concussion.  Patient's labs were reassuring with normal white count glucose was low  Patient does report headache.  According to the husband she does occasionally get headaches and that is not atypical for her.  She did have an episode of nausea and he states also that she does occasionally get that.  He does report that she has a history of lupus.  He denies her complaining of any severe headache last night or early this morning.  Maybe a little bit of a headache prior to the fall but it would have been sometime within the last 6 hours that I had started.    Patient had a few few superficial scrapes noted on her face but she had one 1 cm laceration through the bottom of her vermilion border of her lower lip.  Did place 1 suture here to approximate vermilion border.  She had another small cut on the left side of her lip.  Is very small in nature probably less than half a centimeter.  We  discussed trying to put a suture in that but given the superficial nature and how small it was they have opted to decline.  We discussed possible team for admission.  Patient be given something to help with the headache.  Discussed with the husband that patient would need sutures out in 5 days.  The patient is on the cardiac monitor to evaluate for evidence of arrhythmia and/or significant  heart rate changes.      FINAL CLINICAL IMPRESSION(S) / ED DIAGNOSES   Final diagnoses:  Fall, initial encounter  Injury of head, initial encounter  Lip laceration, initial encounter     Rx / DC Orders   ED Discharge Orders     None        Note:  This document was prepared using Dragon voice recognition software and may include unintentional dictation errors.   Concha Se, MD 02/26/24 [phone]

## 2024-02-26 NOTE — Assessment & Plan Note (Signed)
 Metoprolol tartrate 37.5 mg p.o. twice daily resumed

## 2024-02-26 NOTE — ED Notes (Signed)
 Neurology in CT with pt

## 2024-02-26 NOTE — Assessment & Plan Note (Signed)
 Home hydroxychloroquine 200 mg p.o. twice daily resumed

## 2024-02-26 NOTE — Consult Note (Signed)
 NEUROLOGY CONSULT NOTE   Date of service: February 26, 2024 Patient Name: Christina Meza MRN:  [id number] DOB:  1966-03-15 Chief Complaint: "Confusion" Requesting Provider: Concha Se, MD  History of Present Illness  [name] is a 58 y.o. female with hx of SLE and essential tremor who takes metoprolol who had a fall of unclear etiology this afternoon.  She was in her normal state of health earlier and then called out to her husband who came on and to find her saying that she needed help.  She took him back to the bathroom where there was significant amounts of blood.  She had clearly fallen, but did not remember having fallen.  She asked him several times over and over what it happened, but he was not there to witness it.  She has never had any previous episodes of passing out or seizure-like activity.  She does have evidence of having hit her head with a developing hematoma on the left side of her scalp.  Initially there was some concern for some left-sided weakness reported by EMS, however does not denies this and it was not present on my examination.  She had been complaining of some nausea and vomiting prior to the event, as well as some photophobia.  Past History   Past Medical History:  Diagnosis Date   Allergy    Arthritis    Asthma    Clostridium difficile infection    2015x2 , 2016x1   Depression    Diastolic dysfunction    a. TTE 1/18: EF 50-55%, no RWMA, Gr1DD, mildly dilated aortic root of 3.4 cm, mildly dilated ascending aorta of 3.7 cm, mild mitral regurgitation, normal size left atrium, normal RV systolic function, PASP normal   Genital warts    GERD (gastroesophageal reflux disease)    Inappropriate sinus tachycardia (HCC)    a. 2010 Nuc Stress test: no ischemia/infarct;  b. 10/2013 Echo: Nl LV fxn, mild MR/TR.   Migraine    Palpitations    Prolonged QT interval    SLE (systemic lupus erythematosus) (HCC)    Stomach ulcer     Past Surgical History:   Procedure Laterality Date   BREAST BIOPSY Right 03/01/2017   u/s core bx neg ADENOSIS AND FOCAL MICROCYST FORMATION.   BREAST CYST ASPIRATION Left 4-5 yrs ago   NEG   CATARACT EXTRACTION  2008   ENDOSCOPIC VEIN LASER TREATMENT     ESOPHAGOGASTRODUODENOSCOPY N/A 07/31/2021   Procedure: ESOPHAGOGASTRODUODENOSCOPY (EGD);  Surgeon: Toledo, Boykin Nearing, MD;  Location: [facility] ENDOSCOPY;  Service: Gastroenterology;  Laterality: N/A;   UMBILICAL HERNIA REPAIR     vulvar excision for HPV  07/04/2014    Family History: Family History  Problem Relation Age of Onset   Arthritis Mother    Bipolar disorder Mother    Depression Mother    Mental illness Mother    Hypertension Maternal Grandmother    Hyperlipidemia Maternal Grandfather    Heart disease Maternal Grandfather    Stroke Maternal Grandfather    Hypertension Maternal Grandfather    Colon cancer Paternal Grandfather    Breast cancer Neg Hx     Social History  reports that she has never smoked. She has never used smokeless tobacco. She reports current alcohol use. She reports that she does not use drugs.  Allergies  Allergen Reactions   Lansoprazole Swelling and Hives    Lips and tongue   Celebrex [Celecoxib] Other (See Comments) and Itching    Recommended not taking due  to a stomach issue.    Sulfa Antibiotics Itching and Hives   Sulfamethoxazole-Trimethoprim Itching and Hives   Furosemide Itching and Hives    Medications   Current Facility-Administered Medications:    dextrose 50 % solution 50 mL, 1 ampule, Intravenous, Once, Funke, Alben Spittle, MD   sodium chloride flush (NS) 0.9 % injection 3 mL, 3 mL, Intravenous, Once, Concha Se, MD  Current Outpatient Medications:    albuterol (VENTOLIN HFA) 108 (90 Base) MCG/ACT inhaler, TAKE 2 PUFFS BY MOUTH EVERY 6 HOURS AS NEEDED FOR WHEEZE OR SHORTNESS OF BREATH, Disp: 8.5 each, Rfl: 2   amLODipine (NORVASC) 2.5 MG tablet, Take 2.5 mg by mouth daily., Disp: , Rfl:    Belimumab 200  MG/ML SOAJ, Inject 200 mg into the skin., Disp: , Rfl:    Biotin 5000 MCG TABS, Take by mouth., Disp: , Rfl:    Cholecalciferol (VITAMIN D3) 3000 units TABS, Take by mouth daily., Disp: , Rfl:    clonazePAM (KLONOPIN) 0.5 MG tablet, Take 0.5-1 tablets (0.25-0.5 mg total) by mouth 2 (two) times daily as needed for anxiety., Disp: 30 tablet, Rfl: 2   diclofenac sodium (VOLTAREN) 1 % GEL, Apply 1 application topically as needed., Disp: , Rfl:    DULoxetine (CYMBALTA) 60 MG capsule, Take 1 capsule (60 mg total) by mouth daily., Disp: 90 capsule, Rfl: 3   estradiol (CLIMARA - DOSED IN MG/24 HR) 0.025 mg/24hr patch, APPLY 1 PATCH ON THE SKIN  ONCE A WEEK, Disp: , Rfl:    folic acid (FOLVITE) 1 MG tablet, Take 1 mg by mouth daily., Disp: , Rfl:    hydroxychloroquine (PLAQUENIL) 200 MG tablet, Take 200 mg by mouth 2 (two) times daily. Alternating 200 mg once a day and next day twice a day., Disp: , Rfl:    ipratropium (ATROVENT) 0.06 % nasal spray, PLACE 2 SPRAYS INTO BOTH NOSTRILS 4 (FOUR) TIMES DAILY. FOR UP TO 5-7 DAYS THEN STOP., Disp: 72 mL, Rfl: 0   metoprolol tartrate (LOPRESSOR) 25 MG tablet, Take 1.5 tablets (37.5 mg total) by mouth 2 (two) times daily., Disp: 180 tablet, Rfl: 3   Multiple Vitamins-Minerals (MULTIVITAMIN WITH MINERALS) tablet, Take 1 tablet by mouth every morning., Disp: , Rfl:    naproxen sodium (ALEVE) 220 MG tablet, Take 1 tablet (220 mg total) by mouth at bedtime., Disp: , Rfl:    pregabalin (LYRICA) 25 MG capsule, Take 1 capsule (25 mg total) by mouth 2 (two) times daily., Disp: 180 capsule, Rfl: 3   Probiotic Product (PROBIOTIC DAILY PO), Take 1 tablet by mouth daily. , Disp: , Rfl:    progesterone (PROMETRIUM) 100 MG capsule, Take 100 mg by mouth at bedtime., Disp: , Rfl:    rOPINIRole (REQUIP) 1 MG tablet, Take 1 tablet (1 mg total) by mouth at bedtime., Disp: 90 tablet, Rfl: 3   valACYclovir (VALTREX) 500 MG tablet, Take 500 mg by mouth at bedtime., Disp: , Rfl:   Vitals    Vitals:   02/26/24 1332  BP: 111/71  Pulse: (!) 102  Resp: 18  Temp: 98.5 F (36.9 C)  SpO2: 98%    There is no height or weight on file to calculate BMI.  Physical Exam   Constitutional: Appears well-developed and well-nourished.   Neurologic Examination    Neuro: Mental Status: Patient is awake, alert, oriented to person, place, month, year, and situation. Patient is able to give a clear and coherent history. No signs of aphasia or neglect Cranial Nerves:  II: Visual Fields are full. Pupils are equal, round, and reactive to light.   III,IV, VI: EOMI without ptosis or diploplia.  V: Facial sensation is symmetric to temperature VII: Facial movement is symmetric.  VIII: hearing is intact to voice X: Uvula elevates symmetrically XII: tongue is midline without atrophy or fasciculations.  Motor: Tone is normal. Bulk is normal. 5/5 strength was present in all four extremities.  Sensory: Sensation is symmetric to light touch and temperature in the arms and legs. Cerebellar: FNF and HKS are intact bilaterally        Labs/Imaging/Neurodiagnostic studies   CBC: No results for input(s): "WBC", "NEUTROABS", "HGB", "HCT", "MCV", "PLT" in the last 168 hours. Basic Metabolic Panel:  Lab Results  Component Value Date   NA 140 01/24/2024   K 4.7 01/24/2024   CO2 26 01/24/2024   GLUCOSE 93 01/24/2024   BUN 13 01/24/2024   CREATININE 0.62 01/24/2024   CALCIUM 9.2 01/24/2024   GFRNONAA >60 01/21/2020   GFRAA >60 01/21/2020   Lipid Panel:  Lab Results  Component Value Date   LDLCALC 94 01/24/2024   HgbA1c:  Lab Results  Component Value Date   HGBA1C 5.6 01/24/2024    CT Head without contrast(Personally reviewed): Negative  CT angio Head and Neck with contrast(Personally reviewed): Negative   ASSESSMENT   Cattleya Dobratz is a 58 y.o. female with a fall of unclear etiology who hit her head during the fall.  Her symptoms seem most consistent with a  concussion, but unfortunately her fall was unwitnessed.  Therefore, possibilities including syncope, seizure, mechanical fall should be considered.  There is no history suggestive of seizure, so I would not start antiepileptics unless was a very strong reason to suspect this.  RECOMMENDATIONS  MRI brain without contrast EEG Cardiac syncope workup per internal medicine ______________________________________________________________________    Signed, Ritta Slot, MD [facility]

## 2024-02-26 NOTE — Assessment & Plan Note (Signed)
 PDMP reviewed Home clonazepam 0.25-0.5 mg p.o. twice daily as needed for anxiety resumed on admission Patient also has active prescription for pregabalin 25 mg, 180 tablets, 90-day supply that was written on 02/14/2024 and filled on 3/12.

## 2024-02-26 NOTE — Hospital Course (Addendum)
 [name] is a 58 year old female with history of lupus, hyperlipidemia, anxiety, depression, who presents emergency department for chief concerns of syncopal event.  Vitals in the ED showed temperature of 98.5, respiratory 18, heart rate 102, blood pressure 111/71, SpO2 98% on room air.  Serum sodium is 133, potassium 4.1, chloride 105, bicarb 25, BUN of 23, serum creatinine 0.60, EGFR greater than 60, nonfasting blood glucose 105, WBC 4.4, hemoglobin 11.7, platelets of 161.  CT head code stroke: No intracranial abnormality or significant interval change.  Left supraorbital scalp soft tissue swelling and hematoma without underlying fracture.  ED treatment: Ondansetron 4 mg IV one-time dose, dextrose 50 mg IV one-time dose, acetaminophen 1000 mg IV one-time dose.  EDP consulted neurology service who has low clinical suspicion for stroke and recommends EEG and MRI routine for tomorrow.

## 2024-02-26 NOTE — H&P (Signed)
 History and Physical   Christina Meza:[id number] DOB: 12-14-1965 DOA: 02/26/2024  PCP: Smitty Cords, DO Outpatient Specialists: [facility] rheumatology Patient coming from: Home  I have personally briefly reviewed patient's old medical records in [facility] EMR.  Chief Concern: Fall  HPI: [name] is a 58 year old female with history of lupus, hyperlipidemia, anxiety, depression, who presents emergency department for chief concerns of syncopal event.  Vitals in the ED showed temperature of 98.5, respiratory 18, heart rate 102, blood pressure 111/71, SpO2 98% on room air.  Serum sodium is 133, potassium 4.1, chloride 105, bicarb 25, BUN of 23, serum creatinine 0.60, EGFR greater than 60, nonfasting blood glucose 105, WBC 4.4, hemoglobin 11.7, platelets of 161.  CT head code stroke: No intracranial abnormality or significant interval change.  Left supraorbital scalp soft tissue swelling and hematoma without underlying fracture.  ED treatment: Ondansetron 4 mg IV one-time dose, dextrose 50 mg IV one-time dose, acetaminophen 1000 mg IV one-time dose.  EDP consulted neurology service who has low clinical suspicion for stroke and recommends EEG and MRI routine for tomorrow. ----------------------------------- At bedside, patient able to tell me her first and last name, age, location, current calendar year.  Patient likes the room dark as currently the light is causing her some sensitivity.  Husband at bedside reports that she was having some light sensitivity and headache prior to passing out today.  Patient reports that she remembers sitting on the toilet for loose bowel movement that started today.  She thinks that she completed her bowel movement.  And then the next thing she remembers she is on the floor and her head was hurting.  She reports the bowel movements was loose and did not look at the color.  She reports no recent concerns for blood in  her stool.  She denies chest pain, shortness of breath, dysuria, hematuria, diarrhea, blood in her stool, fever, unexpected weight changes.  Patient reports she vomited 2 times today in the morning prior to having loose bowel movements.  She reports that she had a poor appetite however did try to drink a vegetable smoothie and ate graham crackers.  She promptly vomited these things, twice.  She reports this is unusual for her.  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  She denies tobacco, EtOH, recreational drug use.  ROS: Constitutional: no weight change, no fever ENT/Mouth: no sore throat, no rhinorrhea Eyes: no eye pain, no vision changes Cardiovascular: no chest pain, no dyspnea,  no edema, no palpitations Respiratory: no cough, no sputum, no wheezing Gastrointestinal: + nausea, + vomiting, no diarrhea, no constipation Genitourinary: no urinary incontinence, no dysuria, no hematuria Musculoskeletal: no arthralgias, no myalgias Skin: no skin lesions, no pruritus, Neuro: + weakness, no loss of consciousness, + syncope Psych: no anxiety, no depression, + decrease appetite Heme/Lymph: no bruising, no bleeding  ED Course: Discussed with EDP, patient requiring hospitalization for chief concerns of fall.  Assessment/Plan  Principal Problem:   Syncope Active Problems:   SLE (systemic lupus erythematosus) (HCC)   Depression, major, recurrent, moderate (HCC)   Moderate persistent asthma   GERD (gastroesophageal reflux disease)   Insomnia   Sinus tachycardia   Raynaud's disease without gangrene   Rheumatoid factor positive   Hyperlipidemia   GAD (generalized anxiety disorder)   Headache   Assessment and Plan:  * Syncope Etiology workup in progress Fall precaution Continue cardiac monitoring Neurology has been  consulted by EDP Complete echo on admission EEG and MRI routine ordered for tomorrow,  02/27/2024  Headache Secondary to head trauma from syncopal at that Patient has left-sided forehead ecchymosis with developing left cheek and lip ecchymosis secondary to mechanical fall with suspected syncopal event Acetaminophen 1000 mg IV every 6 hours as needed for headache, 1 day ordered Morphine 2 mg IV every 4 hours as needed for severe pain, moderate pain for 20 hours ordered Patient states that at the peak the pain was an 8/10 and after the IV acetaminophen, the pain is a 6/10. Goal pain control should decrease to 3-4/10  GAD (generalized anxiety disorder) PDMP reviewed Home clonazepam 0.25-0.5 mg p.o. twice daily as needed for anxiety resumed on admission Patient also has active prescription for pregabalin 25 mg, 180 tablets, 90-day supply that was written on 02/14/2024 and filled on 3/12.  Sinus tachycardia Metoprolol tartrate 37.5 mg p.o. twice daily resumed  Depression, major, recurrent, moderate (HCC) Home duloxetine 60 mg daily resumed  SLE (systemic lupus erythematosus) (HCC) Home hydroxychloroquine 200 mg p.o. twice daily resumed  Chart reviewed.   DVT prophylaxis: TED hose Code Status: Full code Diet: Heart healthy/carb modified diet Family Communication: Updated husband, Dan at bedside with patient's permission Disposition Plan: Pending clinical course Consults called: Neurology service via EDP Admission status: Telemetry cardiac, observation  Past Medical History:  Diagnosis Date   Allergy    Arthritis    Asthma    Clostridium difficile infection    2015x2 , 2016x1   Depression    Diastolic dysfunction    a. TTE 1/18: EF 50-55%, no RWMA, Gr1DD, mildly dilated aortic root of 3.4 cm, mildly dilated ascending aorta of 3.7 cm, mild mitral regurgitation, normal size left atrium, normal RV systolic function, PASP normal   Genital warts    GERD (gastroesophageal reflux disease)    Inappropriate sinus tachycardia (HCC)    a. 2010 Nuc Stress test: no  ischemia/infarct;  b. 10/2013 Echo: Nl LV fxn, mild MR/TR.   Migraine    Palpitations    Prolonged QT interval    SLE (systemic lupus erythematosus) (HCC)    Stomach ulcer    Past Surgical History:  Procedure Laterality Date   BREAST BIOPSY Right 03/01/2017   u/s core bx neg ADENOSIS AND FOCAL MICROCYST FORMATION.   BREAST CYST ASPIRATION Left 4-5 yrs ago   NEG   CATARACT EXTRACTION  2008   ENDOSCOPIC VEIN LASER TREATMENT     ESOPHAGOGASTRODUODENOSCOPY N/A 07/31/2021   Procedure: ESOPHAGOGASTRODUODENOSCOPY (EGD);  Surgeon: Toledo, Boykin Nearing, MD;  Location: [facility] ENDOSCOPY;  Service: Gastroenterology;  Laterality: N/A;   UMBILICAL HERNIA REPAIR     vulvar excision for HPV  07/04/2014   Social History:  reports that she has never smoked. She has never used smokeless tobacco. She reports current alcohol use. She reports that she does not use drugs.  Allergies  Allergen Reactions   Lansoprazole Swelling and Hives    Lips and tongue   Celebrex [Celecoxib] Other (See Comments) and Itching    Recommended not taking due to a stomach issue.    Sulfa Antibiotics Itching and Hives   Sulfamethoxazole-Trimethoprim Itching and Hives   Furosemide Itching and Hives   Family History  Problem Relation Age of Onset   Arthritis Mother    Bipolar disorder Mother    Depression Mother    Mental illness Mother    Hypertension Maternal Grandmother    Hyperlipidemia Maternal Grandfather    Heart disease Maternal  Grandfather    Stroke Maternal Grandfather    Hypertension Maternal Grandfather    Colon cancer Paternal Grandfather    Breast cancer Neg Hx    Family history: Family history reviewed and not pertinent.  Prior to Admission medications   Medication Sig Start Date End Date Taking? Authorizing Provider  albuterol (VENTOLIN HFA) 108 (90 Base) MCG/ACT inhaler TAKE 2 PUFFS BY MOUTH EVERY 6 HOURS AS NEEDED FOR WHEEZE OR SHORTNESS OF BREATH 02/10/22   Karamalegos, Netta Neat, DO  amLODipine  (NORVASC) 2.5 MG tablet Take 2.5 mg by mouth daily.    [provider]  Belimumab 200 MG/ML SOAJ Inject 200 mg into the skin. 01/25/17   [provider]  Biotin 5000 MCG TABS Take by mouth.    [provider]  Cholecalciferol (VITAMIN D3) 3000 units TABS Take by mouth daily.    [provider]  clonazePAM (KLONOPIN) 0.5 MG tablet Take 0.5-1 tablets (0.25-0.5 mg total) by mouth 2 (two) times daily as needed for anxiety. 10/26/23   Karamalegos, Netta Neat, DO  diclofenac sodium (VOLTAREN) 1 % GEL Apply 1 application topically as needed.    [provider]  DULoxetine (CYMBALTA) 60 MG capsule Take 1 capsule (60 mg total) by mouth daily. 10/26/23   Karamalegos, Netta Neat, DO  estradiol (CLIMARA - DOSED IN MG/24 HR) 0.025 mg/24hr patch APPLY 1 PATCH ON THE SKIN  ONCE A WEEK 11/30/22   [provider]  folic acid (FOLVITE) 1 MG tablet Take 1 mg by mouth daily. 03/10/20   [provider]  hydroxychloroquine (PLAQUENIL) 200 MG tablet Take 200 mg by mouth 2 (two) times daily. Alternating 200 mg once a day and next day twice a day.    [provider]  ipratropium (ATROVENT) 0.06 % nasal spray PLACE 2 SPRAYS INTO BOTH NOSTRILS 4 (FOUR) TIMES DAILY. FOR UP TO 5-7 DAYS THEN STOP. 02/08/24   Smitty Cords, DO  metoprolol tartrate (LOPRESSOR) 25 MG tablet Take 1.5 tablets (37.5 mg total) by mouth 2 (two) times daily. 09/07/23   Sherie Don, NP  Multiple Vitamins-Minerals (MULTIVITAMIN WITH MINERALS) tablet Take 1 tablet by mouth every morning.    [provider]  naproxen sodium (ALEVE) 220 MG tablet Take 1 tablet (220 mg total) by mouth at bedtime. 08/10/16   Karamalegos, Netta Neat, DO  pregabalin (LYRICA) 25 MG capsule Take 1 capsule (25 mg total) by mouth 2 (two) times daily. 02/14/24 02/08/25  Windell Norfolk, MD  Probiotic Product (PROBIOTIC DAILY PO) Take 1 tablet by mouth daily.     [provider]  progesterone  (PROMETRIUM) 100 MG capsule Take 100 mg by mouth at bedtime.    [provider]  rOPINIRole (REQUIP) 1 MG tablet Take 1 tablet (1 mg total) by mouth at bedtime. 02/14/24 02/08/25  Windell Norfolk, MD  valACYclovir (VALTREX) 500 MG tablet Take 500 mg by mouth at bedtime.    [provider]   Physical Exam: Vitals:   02/26/24 1332  BP: 111/71  Pulse: (!) 102  Resp: 18  Temp: 98.5 F (36.9 C)  SpO2: 98%   Constitutional: appears age-appropriate, frail, mildly uncomfortable Eyes: PERRL, lids and conjunctivae normal ENMT: Mucous membranes are moist. Posterior pharynx clear of any exudate or lesions. Age-appropriate dentition. Hearing appropriate Neck: normal, supple, no masses, no thyromegaly Respiratory: clear to auscultation bilaterally, no wheezing, no crackles. Normal respiratory effort. No accessory muscle use.  Cardiovascular: Regular rate and rhythm, no murmurs / rubs / gallops.  No extremity edema. 2+ pedal pulses. No carotid bruits.  Abdomen: no tenderness, no masses palpated, no hepatosplenomegaly. Bowel sounds positive.  Musculoskeletal: no clubbing / cyanosis. No joint deformity upper and lower extremities. Good ROM, no contractures, no atrophy. Normal muscle tone.  Skin: Left forehead and left cheek developing ecchymosis.  Lower lip with ecchymosis and dried blood Neurologic: Sensation intact. Strength 5/5 in all 4.  Psychiatric: Normal judgment and insight. Alert and oriented x 3.  Depressed mood.  Flat affect  EKG: independently reviewed, showing sinus tachycardia with rate of 101, QTc 488  Chest x-ray on Admission: I personally reviewed and I agree with radiologist reading as below.  X-ray chest PA and lateral Result Date: 02/26/2024 CLINICAL DATA:  Syncope EXAM: CHEST - 2 VIEW COMPARISON:  10/03/2021 FINDINGS: Minimal atelectasis or scar at left base. No consolidation, pleural effusion or pneumothorax. Normal cardiomediastinal silhouette IMPRESSION: No active  cardiopulmonary disease. Minimal atelectasis or scar at left base. Electronically Signed   By: Jasmine Pang M.D.   On: 02/26/2024 15:39   CT C-SPINE NO CHARGE Result Date: 02/26/2024 CLINICAL DATA:  Fall EXAM: CT Cervical Spine without contrast TECHNIQUE: Multiplanar CT images of the cervical spine were reconstructed from contemporary CT of the Neck. RADIATION DOSE REDUCTION: This exam was performed according to the departmental dose-optimization program which includes automated exposure control, adjustment of the mA and/or kV according to patient size and/or use of iterative reconstruction technique. CONTRAST:  None or No additional COMPARISON:  None Available. FINDINGS: Alignment: Reversal of cervical lordosis. Facet alignment is maintained Skull base and vertebrae: No acute fracture. No primary bone lesion or focal pathologic process. Soft tissues and spinal canal: No prevertebral fluid or swelling. No visible canal hematoma. Disc levels: Advanced disc space narrowing C4 through C7. Multilevel facet degenerative changes with right foraminal narrowing C4 through C7. Upper chest: Negative. Other: None IMPRESSION: Reversal of cervical lordosis with multilevel degenerative changes. No acute osseous abnormality. Electronically Signed   By: Jasmine Pang M.D.   On: 02/26/2024 15:38   CT MAXILLOFACIAL W CONTRAST Result Date: 02/26/2024 CLINICAL DATA:  Facial trauma.  Blunt.  Fall today. EXAM: CT MAXILLOFACIAL WITH CONTRAST TECHNIQUE: Multidetector CT imaging of the maxillofacial structures was performed with intravenous contrast. Multiplanar CT image reconstructions were also generated. RADIATION DOSE REDUCTION: This exam was performed according to the departmental dose-optimization program which includes automated exposure control, adjustment of the mA and/or kV according to patient size and/or use of iterative reconstruction technique. CONTRAST:  75mL OMNIPAQUE IOHEXOL 350 MG/ML SOLN COMPARISON:  None  Available. FINDINGS: Osseous: No acute or healing fractures are present. Facial bones are intact. Mandible is intact and located. Orbits: Bilateral lens replacements are noted. Globes and orbits are otherwise unremarkable. Sinuses: Moderate mucosal thickening is present at the floor of the right maxillary sinus. The paranasal sinuses and mastoid air cells are otherwise clear. Soft tissues: Left supraorbital scalp soft tissue swelling/hematoma is present without underlying fracture. The soft tissues of the face are otherwise within normal limits. Limited intracranial: Within normal limits. IMPRESSION: 1. Left supraorbital scalp soft tissue swelling/hematoma without underlying fracture. 2. No acute or healing fractures. 3. Moderate mucosal thickening at the floor of the right maxillary sinus. Electronically Signed   By: Marin Roberts M.D.   On: 02/26/2024 14:23   CT ANGIO HEAD NECK W WO CM (CODE STROKE) Result Date: 02/26/2024 CLINICAL DATA:  Fall today.  Confusion.  Headache.  Code stroke. EXAM: CT ANGIOGRAPHY HEAD AND NECK WITH AND  WITHOUT CONTRAST TECHNIQUE: Multidetector CT imaging of the head and neck was performed using the standard protocol during bolus administration of intravenous contrast. Multiplanar CT image reconstructions and MIPs were obtained to evaluate the vascular anatomy. Carotid stenosis measurements (when applicable) are obtained utilizing NASCET criteria, using the distal internal carotid diameter as the denominator. RADIATION DOSE REDUCTION: This exam was performed according to the departmental dose-optimization program which includes automated exposure control, adjustment of the mA and/or kV according to patient size and/or use of iterative reconstruction technique. CONTRAST:  75mL OMNIPAQUE IOHEXOL 350 MG/ML SOLN COMPARISON:  CT head without contrast 02/26/2024. MR head without contrast 07/07/2022. FINDINGS: CTA NECK FINDINGS Aortic arch: Common origin left common carotid artery in  the innominate artery is present. No significant atherosclerotic changes are present at the aortic arch. Great vessel origins are widely patent. No aneurysm or dissection is present. Right carotid system: The right common carotid artery is within normal limits. Atherosclerotic changes are present at the carotid bifurcation. The cervical left ICA is normal. Left carotid system: The left common carotid artery is within normal limits. Minimal calcifications are present at the proximal left ICA. The cervical left ICA is normal. Vertebral arteries: Left common carotid scratched at the left vertebral artery is dominant. Both vertebral arteries originate from the subclavian arteries without significant stenosis. No significant stenosis is present in either vertebral artery in the neck. Skeleton: Straightening and slight reversal of the normal cervical lordosis is present. No significant listhesis is present. Moderate endplate degenerative changes are present at C4-5, C5-6 and C6-7. Uncovertebral spurring contributes to right foraminal narrowing at these levels. Other neck: The soft tissues of the neck are otherwise unremarkable. Salivary glands are within normal limits. Thyroid is normal. No significant adenopathy is present. No focal mucosal or submucosal lesions are present. Upper chest: The lung apices are clear. The thoracic inlet is within normal limits. Review of the MIP images confirms the above findings CTA HEAD FINDINGS Anterior circulation: The internal carotid arteries are within normal limits from the skull base to the ICA termini. The A1 and M1 segments are normal. Anterior communicating artery is patent. The MCA bifurcations are normal bilaterally. The ACA and MCA branch vessels are normal bilaterally. No aneurysm is present. Posterior circulation: Minimal atherosclerotic changes are present at the dural margin of the left vertebral artery. PICA origins are visualized and normal. The vertebrobasilar junction  and basilar artery normal. The superior cerebellar arteries are normal bilaterally. Both posterior cerebral arteries originate from basilar tip. The PCA branch vessels are normal bilaterally. Venous sinuses: The dural sinuses are patent. The straight sinus and deep cerebral veins are intact. Cortical veins are within normal limits. No significant vascular malformation is evident. Anatomic variants: None Review of the MIP images confirms the above findings IMPRESSION: 1. No significant proximal stenosis, aneurysm, or branch vessel occlusion within the Circle of Willis. 2. Minimal atherosclerotic changes at the carotid bifurcations and proximal left ICA without significant stenosis. 3. Dominant left vertebral artery without significant stenosis. 4. Moderate endplate degenerative changes at C4-5, C5-6 and C6-7. Uncovertebral spurring contributes to right foraminal narrowing at these levels. Electronically Signed   By: Marin Roberts M.D.   On: 02/26/2024 14:21   CT HEAD CODE STROKE WO CONTRAST Result Date: 02/26/2024 CLINICAL DATA:  Code stroke. Left-sided weakness. Fall today. Confusion after the fall. Headache. EXAM: CT HEAD WITHOUT CONTRAST TECHNIQUE: Contiguous axial images were obtained from the base of the skull through the vertex without intravenous contrast. RADIATION DOSE  REDUCTION: This exam was performed according to the departmental dose-optimization program which includes automated exposure control, adjustment of the mA and/or kV according to patient size and/or use of iterative reconstruction technique. COMPARISON:  MR head without and with contrast 03/07/2022. FINDINGS: Brain: No acute infarct, hemorrhage, or mass lesion is present. Mild atrophy is present. Periventricular and scattered subcortical foci of white matter hypoattenuation are moderately advanced for age, similar the prior study. The brainstem and cerebellum are within normal limits. Midline structures are within normal limits.  Vascular: Minimal vascular calcifications are present within the cavernous internal carotid arteries bilaterally. No hyperdense vessel is present. Skull: Moderate left supraorbital scalp soft tissue swelling and hematoma is present. No underlying fracture is present. No other significant extracranial soft tissue injury is present. Sinuses/Orbits: The paranasal sinuses and mastoid air cells are clear. Bilateral lens replacements are noted. Globes and orbits are otherwise unremarkable. ASPECTS [facility] Stroke Program Early CT Score) - Ganglionic level infarction (caudate, lentiform nuclei, internal capsule, insula, M1-M3 cortex): 7/7 - Supraganglionic infarction (M4-M6 cortex): 3/3 Total score (0-10 with 10 being normal): 10/10 IMPRESSION: 1. No acute intracranial abnormality or significant interval change. 2. Moderate left supraorbital scalp soft tissue swelling and hematoma without underlying fracture. 3. Mild atrophy and white matter disease is moderately advanced for age. This likely reflects the sequela of chronic microvascular ischemia. 4. Aspects 10/10 Electronically Signed   By: Marin Roberts M.D.   On: 02/26/2024 14:01   Labs on Admission: I have personally reviewed following labs  CBC: Recent Labs  Lab 02/26/24 1413  WBC 4.4  NEUTROABS 3.4  HGB 11.7*  HCT 35.2*  MCV 97.2  PLT 161   Basic Metabolic Panel: Recent Labs  Lab 02/26/24 1413  NA 133*  K 4.1  CL 105  CO2 25  GLUCOSE 105*  BUN 23*  CREATININE 0.60  CALCIUM 8.3*   GFR: Estimated Creatinine Clearance: 67 mL/min (by C-G formula based on SCr of 0.6 mg/dL).  Liver Function Tests: Recent Labs  Lab 02/26/24 1413  AST 24  ALT 19  ALKPHOS 61  BILITOT 0.6  PROT 5.7*  ALBUMIN 3.3*   Coagulation Profile: Recent Labs  Lab 02/26/24 1413  INR 0.9   CBG: Recent Labs  Lab 02/26/24 1337  GLUCAP 68*   Urine analysis:    Component Value Date/Time   COLORURINE YELLOW (A) 01/21/2020 1311   APPEARANCEUR HAZY  (A) 01/21/2020 1311   LABSPEC 1.006 01/21/2020 1311   PHURINE 6.0 01/21/2020 1311   GLUCOSEU NEGATIVE 01/21/2020 1311   HGBUR NEGATIVE 01/21/2020 1311   BILIRUBINUR NEGATIVE 01/21/2020 1311   KETONESUR NEGATIVE 01/21/2020 1311   PROTEINUR NEGATIVE 01/21/2020 1311   NITRITE NEGATIVE 01/21/2020 1311   LEUKOCYTESUR NEGATIVE 01/21/2020 1311   This document was prepared using Dragon Voice Recognition software and may include unintentional dictation errors.  Dr. Sedalia Muta [facility]  If 7PM-7AM, please contact overnight-coverage provider If 7AM-7PM, please contact day attending provider [email or web address]  02/26/2024, 4:41 PM

## 2024-02-27 ENCOUNTER — Observation Stay

## 2024-02-27 ENCOUNTER — Other Ambulatory Visit: Payer: Self-pay

## 2024-02-27 ENCOUNTER — Observation Stay (HOSPITAL_BASED_OUTPATIENT_CLINIC_OR_DEPARTMENT_OTHER)
Admit: 2024-02-27 | Discharge: 2024-02-27 | Disposition: A | Discharge status: Home / Self Care | Attending: Internal Medicine

## 2024-02-27 DIAGNOSIS — F411 Generalized anxiety disorder: Secondary | ICD-10-CM

## 2024-02-27 DIAGNOSIS — S0990XA Unspecified injury of head, initial encounter: Secondary | ICD-10-CM | POA: Diagnosis not present

## 2024-02-27 DIAGNOSIS — R000 Tachycardia, unspecified: Secondary | ICD-10-CM

## 2024-02-27 DIAGNOSIS — R55 Syncope and collapse: Secondary | ICD-10-CM | POA: Diagnosis not present

## 2024-02-27 DIAGNOSIS — R930 Abnormal findings on diagnostic imaging of skull and head, not elsewhere classified: Secondary | ICD-10-CM

## 2024-02-27 LAB — ECHOCARDIOGRAM COMPLETE
AR max vel: 2.32 cm2
AV Area VTI: 2.23 cm2
AV Area mean vel: 2.12 cm2
AV Mean grad: 2 mmHg
AV Peak grad: 5.1 mmHg
Ao pk vel: 1.13 m/s
Area-P 1/2: 5.54 cm2
Height: 64 in
MV VTI: 2.21 cm2
S' Lateral: 3.3 cm
Single Plane A4C EF: 54.2 %
Weight: 2255.75 [oz_av]

## 2024-02-27 LAB — BASIC METABOLIC PANEL
Anion gap: 4 — ABNORMAL LOW (ref 5–15)
BUN: 15 mg/dL (ref 6–20)
CO2: 26 mmol/L (ref 22–32)
Calcium: 8.3 mg/dL — ABNORMAL LOW (ref 8.9–10.3)
Chloride: 106 mmol/L (ref 98–111)
Creatinine, Ser: 0.83 mg/dL (ref 0.44–1.00)
GFR, Estimated: >60 mL/min (ref >60)
Glucose, Bld: 91 mg/dL (ref 70–99)
Potassium: 4.2 mmol/L (ref 3.5–5.1)
Sodium: 136 mmol/L (ref 135–145)

## 2024-02-27 LAB — CBC
HCT: 35.4 % — ABNORMAL LOW (ref 36.0–46.0)
Hemoglobin: 11.8 g/dL — ABNORMAL LOW (ref 12.0–15.0)
MCH: 33 pg (ref 26.0–34.0)
MCHC: 33.3 g/dL (ref 30.0–36.0)
MCV: 98.9 fL (ref 80.0–100.0)
Platelets: 173 10*3/uL (ref 150–400)
RBC: 3.58 MIL/uL — ABNORMAL LOW (ref 3.87–5.11)
RDW: 14.1 % (ref 11.5–15.5)
WBC: 2.5 10*3/uL — ABNORMAL LOW (ref 4.0–10.5)
nRBC: 0 % (ref 0.0–0.2)

## 2024-02-27 LAB — TROPONIN I (HIGH SENSITIVITY): Troponin I (High Sensitivity): 16 ng/L (ref <18)

## 2024-02-27 LAB — ANTITHROMBIN III: AntiThromb III Func: 112 % (ref 75–120)

## 2024-02-27 MED ORDER — GADOBUTROL 1 MMOL/ML IV SOLN
6.0000 mL | Freq: Once | INTRAVENOUS | Status: AC | PRN
  Administered 2024-02-27: 6 mL via INTRAVENOUS

## 2024-02-27 MED ORDER — MORPHINE SULFATE (PF) 2 MG/ML IV SOLN
2.0000 mg | Freq: Once | INTRAVENOUS | Status: AC
  Administered 2024-02-27: 2 mg via INTRAVENOUS
  Filled 2024-02-27: qty 1

## 2024-02-27 NOTE — ED Notes (Signed)
 Pt ambulated to the bathroom without any difficulty with this narrator to ensure safety.

## 2024-02-27 NOTE — Progress Notes (Signed)
 OT Cancellation Note  Patient Details Name: Christina Meza MRN: [id number] DOB: 1966/06/21   Cancelled Treatment:    Reason Eval/Treat Not Completed: OT screened, no needs identified, will sign off. Pt observed walking back from hallway bathroom independently, mild unsteadiness (pt just waking up). Pt states she is at her baseline, no focal deficits. Politely declines participation in OT eval at this time. OT will complete orders, please re-consult if new needs arise.   Enzley Kitchens L. Kristoff Coonradt, OTR/L  02/27/24, 8:56 AM

## 2024-02-27 NOTE — ED Notes (Signed)
 Lab called to collect blood work.

## 2024-02-27 NOTE — Consult Note (Signed)
 Cardiology Consultation:   Patient ID: Christina Meza; [id number]; 02/18/1966   Admit date: 02/26/2024 Date of Consult: 02/27/2024  Primary Care Provider: Smitty Cords, DO Primary Cardiologist: Wynonia Musty Electrophysiologist:  Graciela Husbands   Patient Profile:   Christina Meza is a 58 y.o. female with a hx of inappropriate sinus tachycardia, prolonged QT, SLE, migraine disorder, depression, and Raynaud's who is being seen today for the evaluation of syncope at the request of Dr. Allena Katz.  History of Present Illness:   Ms. Prestwood underwent echo in 2018 showing an EF of 50 to 55%, no regional wall motion rise, grade 1 diastolic dysfunction, mildly dilated aortic root measuring 3.4 cm, mildly dilated ascending aorta measuring 3.7 cm, mild mitral regurgitation, and normal RV systolic function and RVSP.  48-hour Holter in 2019 showed frequent sinus tachycardia with 54% of the beats in the tachycardic range with rare PACs and PVCs.  Coronary CTA in 2021 showed a calcium score of 0 and no significant incidental noncardiac findings.  Echo in 2022 showed an EF of 50 to 55%, global hypokinesis, grade 1 diastolic dysfunction, normal RV systolic function and RVSP, mild mitral regurgitation, mild aortic insufficiency, dilated aortic root measuring 39 mm and dilated ascending aorta measuring 40 mm.  She has been maintained on Lopressor 37.5 mg twice daily by EP for inappropriate sinus tachycardia.  She presented to [facility] ED on 02/26/2024 with a syncopal episode.  Patient reports that she got up that morning took her regular morning medications with a protein shake and went to walk the dogs.  Following this that she felt nauseated and had 2 episodes of emesis.  She then began to feel like she was going to have diarrhea.  She went and sat on the toilet.  The next thing she remembers is seeing blood on the floor and toilet.  She reports that she was sitting on the toilet at this time, when she came  to.  She noticed blood on the outside of the toilet and all over the floor.  She then called out to her husband.  Episode was unwitnessed.  Following this event she has been confused.  She denies any chest pain preceding or after the above episode.  No palpitations.  There was some flushing.  No shortness of breath.  Upon arrival to [facility] there was some left-sided weakness with code stroke being activated.  CT of the head showed no acute stroke.  EKG showed sinus tachycardia with rate of 101 bpm and no acute ST-T changes with stable QT.  CTA of the head/neck showed no significant proximal stenosis, aneurysm, or branch vessel occlusion within the circle of Willis.  Minimal atherosclerotic changes in the carotid bifurcations as well as proximal left ICA without significant stenosis.  No significant vertebral artery stenosis.  Remaining CT imaging as outlined below.  High-sensitivity troponin negative x 2.  Hgb 11.7 consistent with readings dating back to 2020.  Telemetry shows sinus rhythm with no significant arrhythmias, prolonged pauses, or evidence of high-grade AV block.  Patient continues to note some ongoing fatigue.  She is currently without symptoms of angina or cardiac decompensation.  MRI of the brain, EEG, and echo are pending.   Past Medical History:  Diagnosis Date   Allergy    Arthritis    Asthma    Clostridium difficile infection    2015x2 , 2016x1   Depression    Diastolic dysfunction    a. TTE 1/18: EF 50-55%, no RWMA, Gr1DD, mildly dilated aortic  root of 3.4 cm, mildly dilated ascending aorta of 3.7 cm, mild mitral regurgitation, normal size left atrium, normal RV systolic function, PASP normal   Genital warts    GERD (gastroesophageal reflux disease)    Inappropriate sinus tachycardia (HCC)    a. 2010 Nuc Stress test: no ischemia/infarct;  b. 10/2013 Echo: Nl LV fxn, mild MR/TR.   Migraine    Palpitations    Prolonged QT interval    SLE (systemic lupus erythematosus) (HCC)     Stomach ulcer     Past Surgical History:  Procedure Laterality Date   BREAST BIOPSY Right 03/01/2017   u/s core bx neg ADENOSIS AND FOCAL MICROCYST FORMATION.   BREAST CYST ASPIRATION Left 4-5 yrs ago   NEG   CATARACT EXTRACTION  2008   ENDOSCOPIC VEIN LASER TREATMENT     ESOPHAGOGASTRODUODENOSCOPY N/A 07/31/2021   Procedure: ESOPHAGOGASTRODUODENOSCOPY (EGD);  Surgeon: Toledo, Boykin Nearing, MD;  Location: [facility] ENDOSCOPY;  Service: Gastroenterology;  Laterality: N/A;   UMBILICAL HERNIA REPAIR     vulvar excision for HPV  07/04/2014     Home Meds: Prior to Admission medications   Medication Sig Start Date End Date Taking? Authorizing Provider  albuterol (VENTOLIN HFA) 108 (90 Base) MCG/ACT inhaler TAKE 2 PUFFS BY MOUTH EVERY 6 HOURS AS NEEDED FOR WHEEZE OR SHORTNESS OF BREATH 02/10/22  Yes Karamalegos, Netta Neat, DO  amLODipine (NORVASC) 2.5 MG tablet Take 2.5 mg by mouth daily.   Yes [provider]  Belimumab 200 MG/ML SOAJ Inject 200 mg into the skin. 01/25/17  Yes [provider]  Biotin 5000 MCG TABS Take 5,000 mcg by mouth every morning.   Yes [provider]  cholecalciferol (VITAMIN D3) 25 MCG (1000 UNIT) tablet Take 1,000 Units by mouth daily.   Yes [provider]  clonazePAM (KLONOPIN) 0.5 MG tablet Take 0.5-1 tablets (0.25-0.5 mg total) by mouth 2 (two) times daily as needed for anxiety. 10/26/23  Yes Karamalegos, Netta Neat, DO  diclofenac sodium (VOLTAREN) 1 % GEL Apply 1 application topically as needed.   Yes [provider]  DULoxetine (CYMBALTA) 60 MG capsule Take 1 capsule (60 mg total) by mouth daily. 10/26/23  Yes Karamalegos, Netta Neat, DO  estradiol (CLIMARA - DOSED IN MG/24 HR) 0.025 mg/24hr patch APPLY 1 PATCH ON THE SKIN  ONCE A WEEK 11/30/22  Yes [provider]  folic acid (FOLVITE) 1 MG tablet Take 1 mg by mouth daily. 03/10/20  Yes [provider]  hydroxychloroquine (PLAQUENIL) 200 MG tablet Take 200  mg by mouth 2 (two) times daily. Alternating 200 mg once a day and next day twice a day.   Yes [provider]  ipratropium (ATROVENT) 0.06 % nasal spray PLACE 2 SPRAYS INTO BOTH NOSTRILS 4 (FOUR) TIMES DAILY. FOR UP TO 5-7 DAYS THEN STOP. 02/08/24  Yes Karamalegos, Netta Neat, DO  metoprolol tartrate (LOPRESSOR) 25 MG tablet Take 1.5 tablets (37.5 mg total) by mouth 2 (two) times daily. 09/07/23  Yes Sherie Don, NP  minoxidil (LONITEN) 2.5 MG tablet Take 1.25 mg by mouth at bedtime. 11/22/23  Yes [provider]  Multiple Vitamins-Minerals (MULTIVITAMIN WITH MINERALS) tablet Take 1 tablet by mouth every morning.   Yes [provider]  naproxen sodium (ALEVE) 220 MG tablet Take 1 tablet (220 mg total) by mouth at bedtime. 08/10/16  Yes Karamalegos, Netta Neat, DO  pregabalin (LYRICA) 25 MG capsule Take 1 capsule (25 mg total) by mouth 2 (two) times daily. 02/14/24 02/08/25 Yes  Windell Norfolk, MD  Probiotic Product (PROBIOTIC DAILY PO) Take 1 tablet by mouth daily.    Yes [provider]  progesterone (PROMETRIUM) 100 MG capsule Take 100 mg by mouth at bedtime.   Yes [provider]  rOPINIRole (REQUIP) 1 MG tablet Take 1 tablet (1 mg total) by mouth at bedtime. 02/14/24 02/08/25 Yes Windell Norfolk, MD  valACYclovir (VALTREX) 500 MG tablet Take 500 mg by mouth at bedtime.   Yes [provider]    Inpatient Medications: Scheduled Meds:  DULoxetine  60 mg Oral Daily   folic acid  1 mg Oral Daily   hydroxychloroquine  200 mg Oral Once per day on Monday Wednesday Friday   And   hydroxychloroquine  200 mg Oral 2 times per day on Sunday Tuesday Thursday Saturday   metoprolol tartrate  37.5 mg Oral BID   pregabalin  25 mg Oral BID   progesterone  100 mg Oral QHS   rOPINIRole  1 mg Oral QHS   sodium chloride flush  3 mL Intravenous Q12H   valACYclovir  500 mg Oral QHS   Continuous Infusions:  acetaminophen     PRN Meds: acetaminophen,  acetaminophen **OR** acetaminophen, albuterol, clonazePAM, diclofenac Sodium, ondansetron **OR** ondansetron (ZOFRAN) IV, senna-docusate  Allergies:   Allergies  Allergen Reactions   Lansoprazole Swelling and Hives    Lips and tongue   Celebrex [Celecoxib] Other (See Comments) and Itching    Recommended not taking due to a stomach issue.    Sulfa Antibiotics Itching and Hives   Sulfamethoxazole-Trimethoprim Itching and Hives   Furosemide Itching and Hives    Social History:   Social History   Socioeconomic History   Marital status: Married    Spouse name: Not on file   Number of children: Not on file   Years of education: Not on file   Highest education level: Master's degree (e.g., MA, MS, MEng, MEd, MSW, MBA)  Occupational History   Not on file  Tobacco Use   Smoking status: Never   Smokeless tobacco: Never  Vaping Use   Vaping status: Never Used  Substance and Sexual Activity   Alcohol use: Yes    Alcohol/week: 0.0 - 1.0 standard drinks of alcohol   Drug use: No   Sexual activity: Yes    Partners: Male  Other Topics Concern   Not on file  Social History Narrative   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   Social Drivers of Corporate investment banker Strain: Low Risk  (10/26/2023)   Overall Financial Resource Strain (CARDIA)    Difficulty of Paying Living Expenses: Not hard at all  Food Insecurity: No Food Insecurity (10/26/2023)   Hunger Vital Sign    Worried About Running Out of Food in the Last Year: Never true    Ran Out of Food in the Last Year: Never true  Transportation Needs: No Transportation Needs (10/26/2023)   PRAPARE - Administrator, Civil Service (Medical): No    Lack of Transportation (Non-Medical): No  Physical Activity: Insufficiently Active (10/26/2023)   Exercise Vital Sign    Days of Exercise per Week: 2 days    Minutes of Exercise per Session: 30 min  Stress: Stress Concern Present (10/26/2023)   [employer] of Occupational Health -  Occupational Stress Questionnaire    Feeling of Stress : To some extent  Social Connections: Unknown (10/26/2023)   Social Connection and Isolation Panel [NHANES]    Frequency of Communication with Friends and Family:  Once a week    Frequency of Social Gatherings with Friends and Family: Patient declined    Attends Religious Services: 1 to 4 times per year    Active Member of [employer] or Organizations: No    Attends Engineer, structural: Not on file    Marital Status: Married  Catering manager Violence: Not At Risk (03/03/2023)   Received from [facility], [facility]   Humiliation, Afraid, Rape, and Kick questionnaire    Fear of Current or Ex-Partner: No    Emotionally Abused: No    Physically Abused: No    Sexually Abused: No     Family History:   Family History  Problem Relation Age of Onset   Arthritis Mother    Bipolar disorder Mother    Depression Mother    Mental illness Mother    Hypertension Maternal Grandmother    Hyperlipidemia Maternal Grandfather    Heart disease Maternal Grandfather    Stroke Maternal Grandfather    Hypertension Maternal Grandfather    Colon cancer Paternal Grandfather    Breast cancer Neg Hx     ROS:  Review of Systems  Constitutional:  Positive for malaise/fatigue. Negative for chills, diaphoresis, fever and weight loss.  HENT:  Negative for congestion.   Eyes:  Negative for discharge and redness.  Respiratory:  Negative for cough, sputum production, shortness of breath and wheezing.   Cardiovascular:  Negative for chest pain, palpitations, orthopnea, claudication, leg swelling and PND.  Gastrointestinal:  Positive for abdominal pain, diarrhea, nausea and vomiting. Negative for blood in stool, constipation, heartburn and melena.  Musculoskeletal:  Positive for falls. Negative for myalgias.  Skin:  Negative for rash.  Neurological:  Positive for loss of consciousness and headaches. Negative for dizziness, tingling, tremors,  sensory change, speech change, focal weakness, seizures and weakness.  Endo/Heme/Allergies:  Does not bruise/bleed easily.  Psychiatric/Behavioral:  Negative for substance abuse. The patient is not nervous/anxious.   All other systems reviewed and are negative.     Physical Exam/Data:   Vitals:   02/27/24 0500 02/27/24 0517 02/27/24 0529 02/27/24 0608  BP: 107/69   101/64  Pulse:    96  Resp: 13 12 19 15   Temp:    97.8 F (36.6 C)  TempSrc:    Oral  SpO2:    100%  Weight:      Height:       No intake or output data in the 24 hours ending 02/27/24 0929 Filed Weights   02/26/24 1832  Weight: 64 kg   Body mass index is 24.2 kg/m.   Physical Exam: General: Well developed, well nourished, in no acute distress. Head: Normocephalic, laceration noted along the lower lip, contusion above the left orbit, sclera non-icteric, no xanthomas, nares without discharge.  Neck: Negative for carotid bruits. JVD not elevated. Lungs: Clear bilaterally to auscultation without wheezes, rales, or rhonchi. Breathing is unlabored. Heart: RRR with S1 S2. No murmurs, rubs, or gallops appreciated. Abdomen: Soft, non-tender, non-distended with normoactive bowel sounds. No hepatomegaly. No rebound/guarding. No obvious abdominal masses. Msk:  Strength and tone appear normal for age. Extremities: No clubbing or cyanosis. No edema. Distal pedal pulses are 2+ and equal bilaterally. Neuro: Alert and oriented X 3. No facial asymmetry. No focal deficit. Moves all extremities spontaneously. Psych:  Responds to questions appropriately with a normal affect.   EKG:  The EKG was personally reviewed and demonstrates: Sinus tachycardia, 101 bpm, nonspecific IVCD, no acute ST-T changes, QT  376 msec Telemetry:  Telemetry was personally reviewed and demonstrates: SR, 70s to 90s, no evidence of arrhythmia, prolonged pauses, or high-grade AV block  Weights: Filed Weights   02/26/24 1832  Weight: 64 kg    Relevant CV  Studies:  2D echo 09/08/2021: 1. Left ventricular ejection fraction, by estimation, is 50 to 55%. The  left ventricle has low normal function. The left ventricle demonstrates  global hypokinesis. Left ventricular diastolic parameters are consistent  with Grade I diastolic dysfunction  (impaired relaxation). The average left ventricular global longitudinal  strain is -14.7 %.   2. Right ventricular systolic function is normal. The right ventricular  size is normal. There is normal pulmonary artery systolic pressure. The  estimated right ventricular systolic pressure is 22.3 mmHg.   3. The mitral valve is normal in structure. Mild mitral valve  regurgitation. No evidence of mitral stenosis.   4. The aortic valve is normal in structure. Trileaflet. Aortic valve  regurgitation is mild. No aortic stenosis is present.   5. There is dilatation of the aortic root, measuring 39 mm. There is mild  dilatation of the ascending aorta, measuring 40 mm.   Comparison(s): LVEF 50-55%, Asc. Aorta 3.7 cm.  __________  Coronary CTA 08/28/2020: Aorta:  Normal size.  No calcifications.  No dissection.   Aortic Valve:  Trileaflet.  No calcifications.   Coronary Arteries:  Normal coronary origin.  Right dominance.   RCA is a large dominant artery that gives rise to PDA and PLA. There is no plaque.   Left main is a large artery that gives rise to LAD and LCX arteries.   LAD is a large vessel that bifurcates in its mid segment. It has no plaque.   LCX is a non-dominant artery that gives rise to a large first obtuse marginal branch. There is no plaque.   Other findings:   Normal pulmonary vein drainage into the left atrium.   Normal left atrial appendage without a thrombus.   Normal size of the pulmonary artery.   IMPRESSION: 1. Coronary calcium score of 0. Patient is low risk for near term coronary events 2. Normal coronary origin with right dominance. 3. No evidence of CAD. 4. CAD-RADS 0.  Consider non-atherosclerotic causes of chest pain. __________  48-hour Holter 12/2017: Normal sinus rhythm. Rare PVCs and PACs. Frequent sinus tachycardia with an average heart rate of 101 bpm.  54% of total beats were in the tachycardia range. __________  2D echo 12/17/2016: - Left ventricle: The cavity size was normal. Systolic function was    normal. The estimated ejection fraction was in the range of 50%    to 55%. Wall motion was normal; there were no regional wall    motion abnormalities. Doppler parameters are consistent with    abnormal left ventricular relaxation (grade 1 diastolic    dysfunction).  - Aortic root: The aortic root was mildly dilated, 3.4 cm  - Ascending aorta: The ascending aorta was mildly dilated, 3.7 cm  - Mitral valve: There was mild regurgitation.  - Left atrium: The atrium was normal in size.  - Right ventricle: Systolic function was normal.  - Pulmonary arteries: Systolic pressure was within the normal    range.    Laboratory Data:  Chemistry Recent Labs  Lab 02/26/24 1413 02/27/24 0342  NA 133* 136  K 4.1 4.2  CL 105 106  CO2 25 26  GLUCOSE 105* 91  BUN 23* 15  CREATININE 0.60 0.83  CALCIUM 8.3* 8.3*  GFRNONAA >60 >60  ANIONGAP 3* 4*    Recent Labs  Lab 02/26/24 1413  PROT 5.7*  ALBUMIN 3.3*  AST 24  ALT 19  ALKPHOS 61  BILITOT 0.6   Hematology Recent Labs  Lab 02/26/24 1413 02/27/24 0342  WBC 4.4 2.5*  RBC 3.62* 3.58*  HGB 11.7* 11.8*  HCT 35.2* 35.4*  MCV 97.2 98.9  MCH 32.3 33.0  MCHC 33.2 33.3  RDW 13.9 14.1  PLT 161 173   Cardiac EnzymesNo results for input(s): "TROPONINI" in the last 168 hours. No results for input(s): "TROPIPOC" in the last 168 hours.  BNPNo results for input(s): "BNP", "PROBNP" in the last 168 hours.  DDimer No results for input(s): "DDIMER" in the last 168 hours.  Radiology/Studies:  X-ray chest PA and lateral Result Date: 02/26/2024 IMPRESSION: No active cardiopulmonary disease. Minimal  atelectasis or scar at left base. Electronically Signed   By: Jasmine Pang M.D.   On: 02/26/2024 15:39   CT C-SPINE NO CHARGE Result Date: 02/26/2024 IMPRESSION: Reversal of cervical lordosis with multilevel degenerative changes. No acute osseous abnormality. Electronically Signed   By: Jasmine Pang M.D.   On: 02/26/2024 15:38   CT MAXILLOFACIAL W CONTRAST Result Date: 02/26/2024 IMPRESSION: 1. Left supraorbital scalp soft tissue swelling/hematoma without underlying fracture. 2. No acute or healing fractures. 3. Moderate mucosal thickening at the floor of the right maxillary sinus. Electronically Signed   By: Marin Roberts M.D.   On: 02/26/2024 14:23   CT ANGIO HEAD NECK W WO CM (CODE STROKE) Result Date: 02/26/2024 IMPRESSION: 1. No significant proximal stenosis, aneurysm, or branch vessel occlusion within the Circle of Willis. 2. Minimal atherosclerotic changes at the carotid bifurcations and proximal left ICA without significant stenosis. 3. Dominant left vertebral artery without significant stenosis. 4. Moderate endplate degenerative changes at C4-5, C5-6 and C6-7. Uncovertebral spurring contributes to right foraminal narrowing at these levels. Electronically Signed   By: Marin Roberts M.D.   On: 02/26/2024 14:21   CT HEAD CODE STROKE WO CONTRAST Result Date: 02/26/2024 IMPRESSION: 1. No acute intracranial abnormality or significant interval change. 2. Moderate left supraorbital scalp soft tissue swelling and hematoma without underlying fracture. 3. Mild atrophy and white matter disease is moderately advanced for age. This likely reflects the sequela of chronic microvascular ischemia. 4. Aspects 10/10 Electronically Signed   By: Marin Roberts M.D.   On: 02/26/2024 14:01    Assessment and Plan:   1. Syncope: -Appears to be consistent with vasovagal etiology possibly exacerbated by dehydration in the setting of emesis and diarrhea precipitating event -Patient had also  recently taken her morning metoprolol with a protein shake -No evidence of arrhythmia, high-grade AV block, or prolonged pauses on telemetry to date, continue to monitor -If telemetry is unrevealing while admitted, anticipate ZIO AT prior to discharge -Obtain echo to evaluate for new significant structural abnormality -MRI brain and EEG per IM/neurology -No driving for 6 months from date of most recent syncopal episode, or until etiology of syncope is identified and adequately treated per [facility] guidelines  2.  Inappropriate sinus tachycardia: -Has been well-controlled on metoprolol 37.5 mg twice daily       For questions or updates, please contact [facility] Please consult [email or web address] for contact info under Cardiology/STEMI.   Signed, Eula Listen, PA-C [facility] Pager: [phone] 02/27/2024, 9:29 AM

## 2024-02-27 NOTE — Progress Notes (Addendum)
 [facility]  - [place] at [facility]   PATIENT NAME: Christina Meza    MR#:  [id number]  DATE OF BIRTH:  23-Aug-1966  SUBJECTIVE:  no family at bedside. Patient came in after she had two episodes of violent vomiting after she had her morning meds her protein shake took her dogs out for walk went to the bathroom thinking she is going to have diarrhea had two episodes of vomiting and then found down herself on the floor with blood on the bathroom floor. Unwitnessed episode.  Denies any symptoms at present weakness. Does have bruise over the left forehead and cheek. Remains in sinus rhythm.    VITALS:  Blood pressure 103/76, pulse 99, temperature 98.7 F (37.1 C), temperature source Oral, resp. rate 14, height 5\' 4"  (1.626 m), weight 64 kg, last menstrual period 07/15/2015, SpO2 97%.  PHYSICAL EXAMINATION:   GENERAL:  58 y.o.-year-old patient with no acute distress. Left forehead and cheek bruise LUNGS: Normal breath sounds bilaterally, no wheezing CARDIOVASCULAR: S1, S2 normal. No murmur   ABDOMEN: Soft, nontender, nondistended. Bowel sounds present.  EXTREMITIES: No  edema b/l.    NEUROLOGIC: nonfocal  patient is alert and awake   LABORATORY PANEL:  CBC Recent Labs  Lab 02/27/24 0342  WBC 2.5*  HGB 11.8*  HCT 35.4*  PLT 173    Chemistries  Recent Labs  Lab 02/26/24 1413 02/27/24 0342  NA 133* 136  K 4.1 4.2  CL 105 106  CO2 25 26  GLUCOSE 105* 91  BUN 23* 15  CREATININE 0.60 0.83  CALCIUM 8.3* 8.3*  AST 24  --   ALT 19  --   ALKPHOS 61  --   BILITOT 0.6  --    Cardiac Enzymes No results for input(s): "TROPONINI" in the last 168 hours. RADIOLOGY:  ECHOCARDIOGRAM COMPLETE Result Date: 02/27/2024    ECHOCARDIOGRAM REPORT   Patient Name:   Christina Meza Date of Exam: 02/27/2024 Medical Rec #:  [id number]          Height:       64.0 in Accession #:    [id number]         Weight:       141.0 lb Date of Birth:  07-10-1966          BSA:           1.686 m Patient Age:    58 years           BP:           103/76 mmHg Patient Gender: F                  HR:           106 bpm. Exam Location:  [facility] Procedure: 2D Echo, Cardiac Doppler, Color Doppler and Strain Analysis (Both            Spectral and Color Flow Doppler were utilized during procedure). Indications:     syncope  History:         Patient has prior history of Echocardiogram examinations, most                  recent 09/08/2021. Arrythmias:Tachycardia,                  Signs/Symptoms:Syncope; Risk Factors:Dyslipidemia.  Sonographer:     Mikki Harbor Referring Phys:  [id number] AMY N COX Diagnosing Phys: Lorine Bears MD IMPRESSIONS  1. Left ventricular ejection fraction, by estimation,  is 55 to 60%. The left ventricle has normal function. The left ventricle has no regional wall motion abnormalities. There is moderate asymmetric left ventricular hypertrophy of the basal-septal segment. Left ventricular diastolic parameters are consistent with Grade I diastolic dysfunction (impaired relaxation). The global longitudinal strain is abnormal.  2. Right ventricular systolic function is normal. The right ventricular size is normal. There is normal pulmonary artery systolic pressure. The estimated right ventricular systolic pressure is 29.0 mmHg.  3. The mitral valve is normal in structure. Mild mitral valve regurgitation. No evidence of mitral stenosis.  4. The aortic valve is normal in structure. Aortic valve regurgitation is not visualized. No aortic stenosis is present.  5. Aortic dilatation noted. There is mild dilatation of the aortic root, measuring 41 mm.  6. The inferior vena cava is normal in size with greater than 50% respiratory variability, suggesting right atrial pressure of 3 mmHg. FINDINGS  Left Ventricle: Left ventricular ejection fraction, by estimation, is 55 to 60%. The left ventricle has normal function. The left ventricle has no regional wall motion abnormalities. Strain was performed and  the global longitudinal strain is abnormal. Global longitudinal strain performed but not reported based on interpreter judgement due to suboptimal tracking. The left ventricular internal cavity size was normal in size. There is moderate asymmetric left ventricular hypertrophy of the basal-septal segment. Left ventricular diastolic parameters are consistent with Grade I diastolic dysfunction (impaired relaxation). Right Ventricle: The right ventricular size is normal. No increase in right ventricular wall thickness. Right ventricular systolic function is normal. There is normal pulmonary artery systolic pressure. The tricuspid regurgitant velocity is 2.55 m/s, and  with an assumed right atrial pressure of 3 mmHg, the estimated right ventricular systolic pressure is 29.0 mmHg. Left Atrium: Left atrial size was normal in size. Right Atrium: Right atrial size was normal in size. Pericardium: There is no evidence of pericardial effusion. Mitral Valve: The mitral valve is normal in structure. Mild mitral valve regurgitation. No evidence of mitral valve stenosis. MV peak gradient, 4.1 mmHg. The mean mitral valve gradient is 2.0 mmHg. Tricuspid Valve: The tricuspid valve is normal in structure. Tricuspid valve regurgitation is mild . No evidence of tricuspid stenosis. Aortic Valve: The aortic valve is normal in structure. Aortic valve regurgitation is not visualized. No aortic stenosis is present. Aortic valve mean gradient measures 2.0 mmHg. Aortic valve peak gradient measures 5.1 mmHg. Aortic valve area, by VTI measures 2.23 cm. Pulmonic Valve: The pulmonic valve was normal in structure. Pulmonic valve regurgitation is mild. No evidence of pulmonic stenosis. Aorta: The aortic root is normal in size and structure and aortic dilatation noted. There is mild dilatation of the aortic root, measuring 41 mm. Venous: The inferior vena cava is normal in size with greater than 50% respiratory variability, suggesting right atrial  pressure of 3 mmHg. IAS/Shunts: No atrial level shunt detected by color flow Doppler.  LEFT VENTRICLE PLAX 2D LVIDd:         4.50 cm     Diastology LVIDs:         3.30 cm     LV e' medial:    7.72 cm/s LV PW:         1.20 cm     LV E/e' medial:  10.9 LV IVS:        1.20 cm     LV e' lateral:   14.40 cm/s LVOT diam:     1.90 cm     LV E/e' lateral: 5.8  LV SV:         40 LV SV Index:   24 LVOT Area:     2.84 cm  LV Volumes (MOD) LV vol d, MOD A4C: 82.3 ml LV vol s, MOD A4C: 37.7 ml LV SV MOD A4C:     82.3 ml RIGHT VENTRICLE RV Basal diam:  3.10 cm RV Mid diam:    2.80 cm RV S prime:     10.00 cm/s TAPSE (M-mode): 1.7 cm LEFT ATRIUM             Index        RIGHT ATRIUM           Index LA diam:        2.70 cm 1.60 cm/m   RA Area:     13.50 cm LA Vol (A2C):   37.4 ml 22.18 ml/m  RA Volume:   31.10 ml  18.44 ml/m LA Vol (A4C):   27.6 ml 16.37 ml/m LA Biplane Vol: 32.4 ml 19.21 ml/m  AORTIC VALVE                    PULMONIC VALVE AV Area (Vmax):    2.32 cm     PV Vmax:       0.74 m/s AV Area (Vmean):   2.12 cm     PV Peak grad:  2.2 mmHg AV Area (VTI):     2.23 cm AV Vmax:           113.00 cm/s AV Vmean:          62.300 cm/s AV VTI:            0.179 m AV Peak Grad:      5.1 mmHg AV Mean Grad:      2.0 mmHg LVOT Vmax:         92.60 cm/s LVOT Vmean:        46.600 cm/s LVOT VTI:          0.141 m LVOT/AV VTI ratio: 0.79  AORTA Ao Root diam: 4.10 cm Ao Asc diam:  3.60 cm MITRAL VALVE               TRICUSPID VALVE MV Area (PHT): 5.54 cm    TR Peak grad:   26.0 mmHg MV Area VTI:   2.21 cm    TR Vmax:        255.00 cm/s MV Peak grad:  4.1 mmHg MV Mean grad:  2.0 mmHg    SHUNTS MV Vmax:       1.01 m/s    Systemic VTI:  0.14 m MV Vmean:      68.7 cm/s   Systemic Diam: 1.90 cm MV Decel Time: 137 msec MV E velocity: 83.80 cm/s MV A velocity: 91.70 cm/s MV E/A ratio:  0.91 Lorine Bears MD Electronically signed by Lorine Bears MD Signature Date/Time: 02/27/2024/2:34:57 PM    Final    MR BRAIN WO CONTRAST Result Date:  02/27/2024 CLINICAL DATA:  Provided history: Mental status change, unknown cause. Additional history provided: Syncopal event. Emesis. History of lupus and hyperlipidemia. EXAM: MRI HEAD WITHOUT CONTRAST TECHNIQUE: Multiplanar, multiecho pulse sequences of the brain and surrounding structures were obtained without intravenous contrast. COMPARISON:  Non-contrast head CT and CT angiogram head/neck 02/26/2024. Brain MRI 07/07/2022. FINDINGS: Brain: Mild generalized cerebral atrophy. Moderate multifocal T2 FLAIR hyperintense signal abnormality within the cerebral white matter and pons, nonspecific and greater than expected for age. 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 No cortical encephalomalacia is identified. There is no acute infarct. No evidence of an intracranial mass. No chronic intracranial blood products. No extra-axial fluid collection. No midline shift. Vascular: Maintained flow voids within the proximal large arterial vessels. Skull and upper cervical spine: No focal worrisome marrow lesion. Slight C3-C4 grade 1 anterolisthesis. Sinuses/Orbits: No mass or acute finding within the imaged orbits. Prior bilateral ocular lens replacement. Moderate-to-severe mucosal thickening in the right maxillary sinus. Other: Left frontoparietal scalp and left facial hematomas. IMPRESSION: 1. No evidence of an acute intracranial abnormality. 2. Multifocal T2 FLAIR hyperintense signal abnormality within the cerebral white matter and pons, moderate in severity and greater than expected for age. These signal changes are nonspecific and differential considerations include chronic small vessel ischemic disease, sequelae of vasculitis/hypercoagulable state, sequelae of a prior infectious/inflammatory process and sequelae of demyelinating disease, among others. 3. Mild cerebral atrophy. 4. Left frontoparietal scalp and left facial hematomas. 5. Moderate-to-severe mucosal thickening within the right maxillary  sinus. Electronically Signed   By: Jackey Loge D.O.   On: 02/27/2024 12:04   X-ray chest PA and lateral Result Date: 02/26/2024 CLINICAL DATA:  Syncope EXAM: CHEST - 2 VIEW COMPARISON:  10/03/2021 FINDINGS: Minimal atelectasis or scar at left base. No consolidation, pleural effusion or pneumothorax. Normal cardiomediastinal silhouette IMPRESSION: No active cardiopulmonary disease. Minimal atelectasis or scar at left base. Electronically Signed   By: Jasmine Pang M.D.   On: 02/26/2024 15:39   CT C-SPINE NO CHARGE Result Date: 02/26/2024 CLINICAL DATA:  Fall EXAM: CT Cervical Spine without contrast TECHNIQUE: Multiplanar CT images of the cervical spine were reconstructed from contemporary CT of the Neck. RADIATION DOSE REDUCTION: This exam was performed according to the departmental dose-optimization program which includes automated exposure control, adjustment of the mA and/or kV according to patient size and/or use of iterative reconstruction technique. CONTRAST:  None or No additional COMPARISON:  None Available. FINDINGS: Alignment: Reversal of cervical lordosis. Facet alignment is maintained Skull base and vertebrae: No acute fracture. No primary bone lesion or focal pathologic process. Soft tissues and spinal canal: No prevertebral fluid or swelling. No visible canal hematoma. Disc levels: Advanced disc space narrowing C4 through C7. Multilevel facet degenerative changes with right foraminal narrowing C4 through C7. Upper chest: Negative. Other: None IMPRESSION: Reversal of cervical lordosis with multilevel degenerative changes. No acute osseous abnormality. Electronically Signed   By: Jasmine Pang M.D.   On: 02/26/2024 15:38   CT MAXILLOFACIAL W CONTRAST Result Date: 02/26/2024 CLINICAL DATA:  Facial trauma.  Blunt.  Fall today. EXAM: CT MAXILLOFACIAL WITH CONTRAST TECHNIQUE: Multidetector CT imaging of the maxillofacial structures was performed with intravenous contrast. Multiplanar CT image  reconstructions were also generated. RADIATION DOSE REDUCTION: This exam was performed according to the departmental dose-optimization program which includes automated exposure control, adjustment of the mA and/or kV according to patient size and/or use of iterative reconstruction technique. CONTRAST:  75mL OMNIPAQUE IOHEXOL 350 MG/ML SOLN COMPARISON:  None Available. FINDINGS: Osseous: No acute or healing fractures are present. Facial bones are intact. Mandible is intact and located. Orbits: Bilateral lens replacements are noted. Globes and orbits are otherwise unremarkable. Sinuses: Moderate mucosal thickening is present at the floor of the right maxillary sinus. The paranasal sinuses and mastoid air cells are otherwise clear. Soft tissues: Left supraorbital scalp soft tissue swelling/hematoma is present without underlying fracture. The soft tissues of the face are otherwise within normal limits. Limited intracranial: Within normal limits. IMPRESSION: 1. Left supraorbital scalp  soft tissue swelling/hematoma without underlying fracture. 2. No acute or healing fractures. 3. Moderate mucosal thickening at the floor of the right maxillary sinus. Electronically Signed   By: Marin Roberts M.D.   On: 02/26/2024 14:23   CT ANGIO HEAD NECK W WO CM (CODE STROKE) Result Date: 02/26/2024 CLINICAL DATA:  Fall today.  Confusion.  Headache.  Code stroke. EXAM: CT ANGIOGRAPHY HEAD AND NECK WITH AND WITHOUT CONTRAST TECHNIQUE: Multidetector CT imaging of the head and neck was performed using the standard protocol during bolus administration of intravenous contrast. Multiplanar CT image reconstructions and MIPs were obtained to evaluate the vascular anatomy. Carotid stenosis measurements (when applicable) are obtained utilizing NASCET criteria, using the distal internal carotid diameter as the denominator. RADIATION DOSE REDUCTION: This exam was performed according to the departmental dose-optimization program which  includes automated exposure control, adjustment of the mA and/or kV according to patient size and/or use of iterative reconstruction technique. CONTRAST:  75mL OMNIPAQUE IOHEXOL 350 MG/ML SOLN COMPARISON:  CT head without contrast 02/26/2024. MR head without contrast 07/07/2022. FINDINGS: CTA NECK FINDINGS Aortic arch: Common origin left common carotid artery in the innominate artery is present. No significant atherosclerotic changes are present at the aortic arch. Great vessel origins are widely patent. No aneurysm or dissection is present. Right carotid system: The right common carotid artery is within normal limits. Atherosclerotic changes are present at the carotid bifurcation. The cervical left ICA is normal. Left carotid system: The left common carotid artery is within normal limits. Minimal calcifications are present at the proximal left ICA. The cervical left ICA is normal. Vertebral arteries: Left common carotid scratched at the left vertebral artery is dominant. Both vertebral arteries originate from the subclavian arteries without significant stenosis. No significant stenosis is present in either vertebral artery in the neck. Skeleton: Straightening and slight reversal of the normal cervical lordosis is present. No significant listhesis is present. Moderate endplate degenerative changes are present at C4-5, C5-6 and C6-7. Uncovertebral spurring contributes to right foraminal narrowing at these levels. Other neck: The soft tissues of the neck are otherwise unremarkable. Salivary glands are within normal limits. Thyroid is normal. No significant adenopathy is present. No focal mucosal or submucosal lesions are present. Upper chest: The lung apices are clear. The thoracic inlet is within normal limits. Review of the MIP images confirms the above findings CTA HEAD FINDINGS Anterior circulation: The internal carotid arteries are within normal limits from the skull base to the ICA termini. The A1 and M1  segments are normal. Anterior communicating artery is patent. The MCA bifurcations are normal bilaterally. The ACA and MCA branch vessels are normal bilaterally. No aneurysm is present. Posterior circulation: Minimal atherosclerotic changes are present at the dural margin of the left vertebral artery. PICA origins are visualized and normal. The vertebrobasilar junction and basilar artery normal. The superior cerebellar arteries are normal bilaterally. Both posterior cerebral arteries originate from basilar tip. The PCA branch vessels are normal bilaterally. Venous sinuses: The dural sinuses are patent. The straight sinus and deep cerebral veins are intact. Cortical veins are within normal limits. No significant vascular malformation is evident. Anatomic variants: None Review of the MIP images confirms the above findings IMPRESSION: 1. No significant proximal stenosis, aneurysm, or branch vessel occlusion within the Circle of Willis. 2. Minimal atherosclerotic changes at the carotid bifurcations and proximal left ICA without significant stenosis. 3. Dominant left vertebral artery without significant stenosis. 4. Moderate endplate degenerative changes at C4-5, C5-6 and C6-7. Uncovertebral spurring  contributes to right foraminal narrowing at these levels. Electronically Signed   By: Marin Roberts M.D.   On: 02/26/2024 14:21   CT HEAD CODE STROKE WO CONTRAST Result Date: 02/26/2024 CLINICAL DATA:  Code stroke. Left-sided weakness. Fall today. Confusion after the fall. Headache. EXAM: CT HEAD WITHOUT CONTRAST TECHNIQUE: Contiguous axial images were obtained from the base of the skull through the vertex without intravenous contrast. RADIATION DOSE REDUCTION: This exam was performed according to the departmental dose-optimization program which includes automated exposure control, adjustment of the mA and/or kV according to patient size and/or use of iterative reconstruction technique. COMPARISON:  MR head without  and with contrast 03/07/2022. FINDINGS: Brain: No acute infarct, hemorrhage, or mass lesion is present. Mild atrophy is present. Periventricular and scattered subcortical foci of white matter hypoattenuation are moderately advanced for age, similar the prior study. The brainstem and cerebellum are within normal limits. Midline structures are within normal limits. Vascular: Minimal vascular calcifications are present within the cavernous internal carotid arteries bilaterally. No hyperdense vessel is present. Skull: Moderate left supraorbital scalp soft tissue swelling and hematoma is present. No underlying fracture is present. No other significant extracranial soft tissue injury is present. Sinuses/Orbits: The paranasal sinuses and mastoid air cells are clear. Bilateral lens replacements are noted. Globes and orbits are otherwise unremarkable. ASPECTS [facility] Stroke Program Early CT Score) - Ganglionic level infarction (caudate, lentiform nuclei, internal capsule, insula, M1-M3 cortex): 7/7 - Supraganglionic infarction (M4-M6 cortex): 3/3 Total score (0-10 with 10 being normal): 10/10 IMPRESSION: 1. No acute intracranial abnormality or significant interval change. 2. Moderate left supraorbital scalp soft tissue swelling and hematoma without underlying fracture. 3. Mild atrophy and white matter disease is moderately advanced for age. This likely reflects the sequela of chronic microvascular ischemia. 4. Aspects 10/10 Electronically Signed   By: Marin Roberts M.D.   On: 02/26/2024 14:01    Assessment and Plan  Elsye Mccollister is a 58 year old female with history of lupus, hyperlipidemia, anxiety, depression, who presents emergency department for chief concerns of syncopal event.   CT head code stroke: No intracranial abnormality or significant interval change.  Left supraorbital scalp soft tissue swelling and hematoma without underlying fracture.   MRI brain1. No evidence of an acute intracranial  abnormality. 2. Multifocal T2 FLAIR hyperintense signal abnormality within the cerebral white matter and pons, moderate in severity and greater than expected for age. These signal changes are nonspecific and differential considerations include chronic small vessel ischemic disease, sequelae of vasculitis/hypercoagulable state, sequelae of a prior infectious/inflammatory process and sequelae of demyelinating disease, among others. 3. Mild cerebral atrophy. 4. Left frontoparietal scalp and left facial hematomas. 5. Moderate-to-severe mucosal thickening within the right maxillary sinus.   ECHO   1. Left ventricular ejection fraction, by estimation, is 55 to 60%. The  left ventricle has normal function. The left ventricle has no regional  wall motion abnormalities. There is moderate asymmetric left ventricular  hypertrophy of the basal-septal  segment. Left ventricular diastolic parameters are consistent with Grade I  diastolic dysfunction (impaired relaxation). The global longitudinal  strain is abnormal.   2. Right ventricular systolic function is normal. The right ventricular  size is normal. There is normal pulmonary artery systolic pressure. The  estimated right ventricular systolic pressure is 29.0 mmHg.   3. The mitral valve is normal in structure. Mild mitral valve  regurgitation. No evidence of mitral stenosis.   4. The aortic valve is normal in structure. Aortic valve regurgitation is  not visualized.  No aortic stenosis is present.   5. Aortic dilatation noted. There is mild dilatation of the aortic root,  measuring 41 mm.   6. The inferior vena cava is normal in size with greater than 50%  respiratory variability, suggesting right atrial pressure of 3 mmHg.   Syncope suspect vasovagal in the setting of GI symptoms (after two episodes of violent vomiting and diarrhea) -- remains in sinus rhythm -- CT head negative other than small hematoma left superintend total area and left  cheek -- cardiology consultation with [facility]. Patient has history of inappropriate sinus tach and follows with [facility] cardiology as outpatient -- neurology consultation for syncope workup. ---- EEG pending  Abnormal MRI -- per neurology MRI cervical spine MRI brain with contrast. Patient has history of two brothers with MS. Need to rule out demyelinating disorder given abnormal MRI findings  Generalized anxiety disorder history of depression -- continue home meds  History of sinus tachycardia -- continue beta-blockers -- cardiology input appreciated. Patient follows with [facility] -- recommends ZIO monitor at discharge  SLE with history of Raynaud's -- continue hydroxychloroquine     Procedures: Family communication : none Consults : neuro-, cardiology CODE STATUS: full DVT Prophylaxis : ambulation. Will avoid given hematoma Level of care: Telemetry Cardiac Status is: Observation The patient remains OBS appropriate and will d/c before 2 midnights.    TOTAL TIME TAKING CARE OF THIS PATIENT: 50 minutes.  >50% time spent on counselling and coordination of care  Note: This dictation was prepared with Dragon dictation along with smaller phrase technology. Any transcriptional errors that result from this process are unintentional.  Enedina Finner M.D    [facility]   CC: Primary care physician; Smitty Cords, DO

## 2024-02-27 NOTE — Procedures (Signed)
 Pt elected to have eeg in the am.  Just finished MRI.

## 2024-02-27 NOTE — ED Notes (Signed)
 Pt to MRI

## 2024-02-27 NOTE — Progress Notes (Signed)
 PT Cancellation Note  Patient Details Name: Christina Meza MRN: [id number] DOB: Nov 01, 1966   Cancelled Treatment:    Reason Eval/Treat Not Completed: PT screened, no needs identified, will sign off.  Chart reviewed and saw pt.  Pt currently denies any formal PT needs and feels as though she is at her baseline level of functioning.  Pt has 5/5 LE strength bilaterally and will be discharged from PT services at this time.  Please re-consult if needs arise.     Nolon Bussing, PT, DPT Physical Therapist - [facility]  02/27/24, 12:17 PM

## 2024-02-27 NOTE — Progress Notes (Signed)
*  PRELIMINARY RESULTS* Echocardiogram 2D Echocardiogram has been performed.  [name] 02/27/2024, 2:22 PM

## 2024-02-27 NOTE — ED Notes (Signed)
 Report received from off going Nurse. Pt A&O x4. Resting quietly in bed. Rise and fall of chest present. No distress present. Bed low and locked, call light in reach.

## 2024-02-28 ENCOUNTER — Inpatient Hospital Stay
Admit: 2024-02-28 | Discharge: 2024-02-28 | Disposition: A | Discharge status: Home / Self Care | Attending: Physician Assistant | Admitting: Physician Assistant

## 2024-02-28 ENCOUNTER — Observation Stay

## 2024-02-28 DIAGNOSIS — R000 Tachycardia, unspecified: Secondary | ICD-10-CM | POA: Diagnosis not present

## 2024-02-28 DIAGNOSIS — S0990XA Unspecified injury of head, initial encounter: Secondary | ICD-10-CM | POA: Diagnosis not present

## 2024-02-28 DIAGNOSIS — R930 Abnormal findings on diagnostic imaging of skull and head, not elsewhere classified: Secondary | ICD-10-CM | POA: Diagnosis not present

## 2024-02-28 DIAGNOSIS — R55 Syncope and collapse: Secondary | ICD-10-CM | POA: Diagnosis not present

## 2024-02-28 DIAGNOSIS — R569 Unspecified convulsions: Secondary | ICD-10-CM

## 2024-02-28 LAB — GLUCOSE, CAPILLARY: Glucose-Capillary: 85 mg/dL (ref 70–99)

## 2024-02-28 MED ORDER — METOPROLOL TARTRATE 25 MG PO TABS: 25.0000 mg | ORAL_TABLET | Freq: Two times a day (BID) | ORAL | 3 refills | Status: DC

## 2024-02-28 MED ORDER — METOPROLOL TARTRATE 25 MG PO TABS
25.0000 mg | ORAL_TABLET | Freq: Two times a day (BID) | ORAL | Status: DC
Start: 2024-02-28 — End: 2024-02-28

## 2024-02-28 MED ORDER — HYDROCODONE-ACETAMINOPHEN 5-325 MG PO TABS
1.0000 | ORAL_TABLET | Freq: Two times a day (BID) | ORAL | Status: DC | PRN
  Administered 2024-02-28: 1 via ORAL
  Filled 2024-02-28: qty 1

## 2024-02-28 MED ORDER — HYDROCODONE-ACETAMINOPHEN 5-325 MG PO TABS: 1.0000 | ORAL_TABLET | Freq: Two times a day (BID) | ORAL | 0 refills | Status: DC | PRN

## 2024-02-28 NOTE — Plan of Care (Signed)
  Problem: Education: Goal: Knowledge of condition and prescribed therapy will improve Outcome: Progressing   Problem: Cardiac: Goal: Will achieve and/or maintain adequate cardiac output Outcome: Progressing   Problem: Education: Goal: Knowledge of General Education information will improve Description: Including pain rating scale, medication(s)/side effects and non-pharmacologic comfort measures Outcome: Progressing

## 2024-02-28 NOTE — Progress Notes (Signed)
 Eeg done

## 2024-02-28 NOTE — Discharge Summary (Signed)
 Physician Discharge Summary   Patient: Christina Meza MRN: [id number] DOB: 12/03/1966  Admit date:     02/26/2024  Discharge date: 02/28/24  Discharge Physician: Enedina Finner   PCP: Smitty Cords, DO   Recommendations at discharge:   follow-up PCP in one week. Lip suture to be removed by PCP   Discharge Diagnoses: Principal Problem:   Syncope Active Problems:   SLE (systemic lupus erythematosus) (HCC)   Depression, major, recurrent, moderate (HCC)   Moderate persistent asthma   GERD (gastroesophageal reflux disease)   Insomnia   Sinus tachycardia   Raynaud's disease without gangrene   Rheumatoid factor positive   Hyperlipidemia   GAD (generalized anxiety disorder)   Headache   Head injury   Abnormal MRI of head  Christina Meza is a 58 year old female with history of lupus, hyperlipidemia, anxiety, depression, who presents emergency department for chief concerns of syncopal event.   CT head code stroke: No intracranial abnormality or significant interval change.  Left supraorbital scalp soft tissue swelling and hematoma without underlying fracture.    MRI brain1. No evidence of an acute intracranial abnormality. 2. Multifocal T2 FLAIR hyperintense signal abnormality within the cerebral white matter and pons, moderate in severity and greater than expected for age. These signal changes are nonspecific and differential considerations include chronic small vessel ischemic disease, sequelae of vasculitis/hypercoagulable state, sequelae of a prior infectious/inflammatory process and sequelae of demyelinating disease, among others. 3. Mild cerebral atrophy. 4. Left frontoparietal scalp and left facial hematomas. 5. Moderate-to-severe mucosal thickening within the right maxillary sinus.   ECHO   1. Left ventricular ejection fraction, by estimation, is 55 to 60%. The  left ventricle has normal function. The left ventricle has no regional  wall motion  abnormalities. There is moderate asymmetric left ventricular  hypertrophy of the basal-septal  segment. Left ventricular diastolic parameters are consistent with Grade I  diastolic dysfunction (impaired relaxation). The global longitudinal  strain is abnormal.   2. Right ventricular systolic function is normal. The right ventricular  size is normal. There is normal pulmonary artery systolic pressure. The  estimated right ventricular systolic pressure is 29.0 mmHg.   3. The mitral valve is normal in structure. Mild mitral valve  regurgitation. No evidence of mitral stenosis.   4. The aortic valve is normal in structure. Aortic valve regurgitation is  not visualized. No aortic stenosis is present.   5. Aortic dilatation noted. There is mild dilatation of the aortic root,  measuring 41 mm.   6. The inferior vena cava is normal in size with greater than 50%  respiratory variability, suggesting right atrial pressure of 3 mmHg.    Syncope suspect vasovagal in the setting of GI symptoms (after two episodes of violent vomiting and diarrhea) -- remains in sinus rhythm -- CT head negative other than small hematoma left superintend total area and left cheek -- cardiology consultation with [facility]. Patient has history of inappropriate sinus tach and follows with [facility] cardiology as outpatient -- neurology consultation for syncope workup. ---- EEG normal -- vital signs remains stable. [facility] cardiology recommends Zio monitor at discharge   Abnormal MRI -- per neurology MRI cervical spine MRI brain with contrast. Patient has history of two brothers with MS. Need to rule out demyelinating disorder given abnormal MRI findings ----MRI brain with contrast/MRI cervical spine MRI thoracic spine-- results discussed by neurology with patient. At present no further workup needed.   Generalized anxiety disorder history of depression -- continue home meds  History of sinus tachycardia -- continue beta-blockers  25 mg bid -- cardiology input appreciated. Patient follows with [facility] -- recommends ZIO monitor at discharge ----BP soft --holding minoxidil and amlodipine for now   SLE with history of Raynaud's -- continue hydroxychloroquine           Procedures:EEG Family communication : none Consults : neuro-, cardiology CODE STATUS: full DVT Prophylaxis : ambulation. Will avoid given hematoma  Will discharge patient to home. She is in agreement.      Pain control - [employer] Controlled Substance Reporting System database was reviewed. and patient was instructed, not to drive, operate heavy machinery, perform activities at heights, swimming or participation in water activities or provide baby-sitting services while on Pain, Sleep and Anxiety Medications; until their outpatient Physician has advised to do so again. Also recommended to not to take more than prescribed Pain, Sleep and Anxiety Medications.   Disposition: Home Diet recommendation:  Discharge Diet Orders (From admission, onward)     Start     Ordered   02/28/24 0000  Diet - low sodium heart healthy        02/28/24 1257           Cardiac diet DISCHARGE MEDICATION: Allergies as of 02/28/2024       Reactions   Lansoprazole Swelling, Hives   Lips and tongue   Celebrex [celecoxib] Other (See Comments), Itching   Recommended not taking due to a stomach issue.    Sulfa Antibiotics Itching, Hives   Sulfamethoxazole-trimethoprim Itching, Hives   Furosemide Itching, Hives        Medication List     PAUSE taking these medications    amLODipine 2.5 MG tablet Wait to take this until your doctor or other care provider tells you to start again. Soft BP Commonly known as: NORVASC Take 2.5 mg by mouth daily.   minoxidil 2.5 MG tablet Wait to take this until your doctor or other care provider tells you to start again. By PCP Commonly known as: LONITEN Take 1.25 mg by mouth at bedtime.       TAKE these  medications    albuterol 108 (90 Base) MCG/ACT inhaler Commonly known as: VENTOLIN HFA TAKE 2 PUFFS BY MOUTH EVERY 6 HOURS AS NEEDED FOR WHEEZE OR SHORTNESS OF BREATH   Belimumab 200 MG/ML Soaj Inject 200 mg into the skin.   Biotin 5000 MCG Tabs Take 5,000 mcg by mouth every morning.   cholecalciferol 25 MCG (1000 UNIT) tablet Commonly known as: VITAMIN D3 Take 1,000 Units by mouth daily.   clonazePAM 0.5 MG tablet Commonly known as: KLONOPIN Take 0.5-1 tablets (0.25-0.5 mg total) by mouth 2 (two) times daily as needed for anxiety.   diclofenac sodium 1 % Gel Commonly known as: VOLTAREN Apply 1 application topically as needed.   DULoxetine 60 MG capsule Commonly known as: CYMBALTA Take 1 capsule (60 mg total) by mouth daily.   estradiol 0.025 mg/24hr patch Commonly known as: CLIMARA - Dosed in mg/24 hr APPLY 1 PATCH ON THE SKIN  ONCE A WEEK   folic acid 1 MG tablet Commonly known as: FOLVITE Take 1 mg by mouth daily.   HYDROcodone-acetaminophen 5-325 MG tablet Commonly known as: NORCO/VICODIN Take 1 tablet by mouth every 12 (twelve) hours as needed for moderate pain (pain score 4-6) or severe pain (pain score 7-10).   hydroxychloroquine 200 MG tablet Commonly known as: PLAQUENIL Take 200 mg by mouth 2 (two) times daily. Alternating 200 mg  once a day and next day twice a day.   ipratropium 0.06 % nasal spray Commonly known as: ATROVENT PLACE 2 SPRAYS INTO BOTH NOSTRILS 4 (FOUR) TIMES DAILY. FOR UP TO 5-7 DAYS THEN STOP.   metoprolol tartrate 25 MG tablet Commonly known as: LOPRESSOR Take 1 tablet (25 mg total) by mouth 2 (two) times daily. What changed: how much to take   multivitamin with minerals tablet Take 1 tablet by mouth every morning.   naproxen sodium 220 MG tablet Commonly known as: Aleve Take 1 tablet (220 mg total) by mouth at bedtime.   pregabalin 25 MG capsule Commonly known as: LYRICA Take 1 capsule (25 mg total) by mouth 2 (two) times  daily.   PROBIOTIC DAILY PO Take 1 tablet by mouth daily.   progesterone 100 MG capsule Commonly known as: PROMETRIUM Take 100 mg by mouth at bedtime.   rOPINIRole 1 MG tablet Commonly known as: REQUIP Take 1 tablet (1 mg total) by mouth at bedtime.   valACYclovir 500 MG tablet Commonly known as: VALTREX Take 500 mg by mouth at bedtime.               Discharge Care Instructions  (From admission, onward)           Start     Ordered   02/28/24 0000  Discharge wound care:       Comments: Laceration Repair (From admission, onward)      Start     Ordered   02/26/24 1440    .Laceration Repair  Once      Comments: This order was created via procedure documentation  03/23/2 Pt advised to f/u PCP--suture removal   02/28/24 1257            Follow-up Information     Smitty Cords, DO. Schedule an appointment as soon as possible for a visit in 1 week(s).   Specialty: Family Medicine Contact information: [address] [place] [address] [phone]                Discharge Exam: Ceasar Mons Weights   02/26/24 1832 02/28/24 0500  Weight: 64 kg 62.4 kg     Condition at discharge: fair  The results of significant diagnostics from this hospitalization (including imaging, microbiology, ancillary and laboratory) are listed below for reference.   Imaging Studies: MR CERVICAL SPINE W WO CONTRAST Result Date: 02/27/2024 CLINICAL DATA:  Concern for demyelinating disease. EXAM: MRI CERVICAL AND THORACIC SPINE WITHOUT AND WITH CONTRAST TECHNIQUE: Multiplanar and multiecho pulse sequences of the cervical spine, to include the craniocervical junction and cervicothoracic junction, and the thoracic spine, were obtained without and with intravenous contrast. CONTRAST:  6mL GADAVIST GADOBUTROL 1 MMOL/ML IV SOLN COMPARISON:  Same day MRI brain. FINDINGS: MRI CERVICAL SPINE FINDINGS Alignment: Straightening and slight reversal of the normal cervical lordosis. Trace  retrolisthesis of C5 on C6 and trace anterolisthesis of C7 on T1. Vertebrae: No bone marrow edema or evidence of acute fracture. Vertebral body heights are maintained. Modic type 2 degenerative endplate changes at C4-5 most pronounced along the right posterior aspect. No suspicious osseous lesion. Cord: The cervical cord is normal in caliber and signal intensity. No cord lesions or evidence of abnormal enhancement. Posterior Fossa, vertebral arteries, paraspinal tissues: T2/STIR hyperintense signal abnormality in the pons corresponding to findings on recent MRI brain. Posterior fossa structures are otherwise unremarkable. The paraspinal musculature is unremarkable. Disc levels: C2-3: No significant spinal canal stenosis. No significant foraminal stenosis. C3-4:  Small disc bulge. No significant spinal canal stenosis. Facet arthrosis more pronounced on the right. Mild right foraminal stenosis. C4-5: Moderate disc height loss. Disc osteophyte complex indents the ventral thecal sac with mild flattening of the ventral cervical cord. Bilateral facet arthrosis. Mild uncovertebral hypertrophy on the right. Severe right foraminal stenosis. C5-6: Moderate disc height loss. Disc osteophyte complex a centric to the right which indents the ventral thecal sac with flattening of the ventral cervical cord. Thickening of the ligamentum flavum. Moderate spinal canal stenosis. Bilateral facet arthrosis. Uncovertebral hypertrophy more pronounced on the right. Moderate to severe right and moderate left foraminal stenosis. C6-7: Mild to moderate disc height loss. Disc osteophyte complex indents the ventral thecal sac without contacting the cervical cord. Bilateral facet arthrosis. Mild-to-moderate bilateral foraminal stenosis. C7-T1: Trace anterolisthesis. Small disc bulge. No significant spinal canal stenosis. Bilateral facet arthrosis. Mild bilateral foraminal stenosis. MRI THORACIC SPINE FINDINGS Alignment:  Thoracic kyphosis is  maintained.  No listhesis. Vertebrae: No bone marrow edema or evidence of fracture. Hemangiomas in the T6 and T12 vertebral bodies. No suspicious osseous lesion. Cord: The thoracic cord is normal in caliber and signal intensity. No evidence of thoracic cord lesions. No abnormal enhancement. Paraspinal and other soft tissues: The paraspinal musculature is unremarkable. Disc levels: Disc heights are maintained. No large disc herniation or high-grade stenosis of the thoracic spinal canal. Shallow disc bulges in the lower thoracic spine without stenosis. Mild facet arthrosis at multiple levels in the thoracic spine. No high-grade foraminal stenosis. IMPRESSION: No lesions in the cervical or thoracic spinal cord. No abnormal enhancement. Degenerative changes as above. Disc osteophyte complex at C5-6 resulting in moderate spinal canal stenosis and flattening of the ventral cord without cord signal abnormality. Multilevel foraminal stenosis in the cervical spine, greatest and severe on the right at C4-5. Additional moderate to severe spinal canal stenosis on the right at C5-6. Mild degenerative changes in the thoracic spine. No high-grade spinal canal or foraminal stenosis. Electronically Signed   By: Emily Filbert M.D.   On: 02/27/2024 19:53   MR THORACIC SPINE W WO CONTRAST Result Date: 02/27/2024 CLINICAL DATA:  Concern for demyelinating disease. EXAM: MRI CERVICAL AND THORACIC SPINE WITHOUT AND WITH CONTRAST TECHNIQUE: Multiplanar and multiecho pulse sequences of the cervical spine, to include the craniocervical junction and cervicothoracic junction, and the thoracic spine, were obtained without and with intravenous contrast. CONTRAST:  6mL GADAVIST GADOBUTROL 1 MMOL/ML IV SOLN COMPARISON:  Same day MRI brain. FINDINGS: MRI CERVICAL SPINE FINDINGS Alignment: Straightening and slight reversal of the normal cervical lordosis. Trace retrolisthesis of C5 on C6 and trace anterolisthesis of C7 on T1. Vertebrae: No bone  marrow edema or evidence of acute fracture. Vertebral body heights are maintained. Modic type 2 degenerative endplate changes at C4-5 most pronounced along the right posterior aspect. No suspicious osseous lesion. Cord: The cervical cord is normal in caliber and signal intensity. No cord lesions or evidence of abnormal enhancement. Posterior Fossa, vertebral arteries, paraspinal tissues: T2/STIR hyperintense signal abnormality in the pons corresponding to findings on recent MRI brain. Posterior fossa structures are otherwise unremarkable. The paraspinal musculature is unremarkable. Disc levels: C2-3: No significant spinal canal stenosis. No significant foraminal stenosis. C3-4: Small disc bulge. No significant spinal canal stenosis. Facet arthrosis more pronounced on the right. Mild right foraminal stenosis. C4-5: Moderate disc height loss. Disc osteophyte complex indents the ventral thecal sac with mild flattening of the ventral cervical cord. Bilateral facet arthrosis. Mild uncovertebral hypertrophy on the right. Severe  right foraminal stenosis. C5-6: Moderate disc height loss. Disc osteophyte complex a centric to the right which indents the ventral thecal sac with flattening of the ventral cervical cord. Thickening of the ligamentum flavum. Moderate spinal canal stenosis. Bilateral facet arthrosis. Uncovertebral hypertrophy more pronounced on the right. Moderate to severe right and moderate left foraminal stenosis. C6-7: Mild to moderate disc height loss. Disc osteophyte complex indents the ventral thecal sac without contacting the cervical cord. Bilateral facet arthrosis. Mild-to-moderate bilateral foraminal stenosis. C7-T1: Trace anterolisthesis. Small disc bulge. No significant spinal canal stenosis. Bilateral facet arthrosis. Mild bilateral foraminal stenosis. MRI THORACIC SPINE FINDINGS Alignment:  Thoracic kyphosis is maintained.  No listhesis. Vertebrae: No bone marrow edema or evidence of fracture.  Hemangiomas in the T6 and T12 vertebral bodies. No suspicious osseous lesion. Cord: The thoracic cord is normal in caliber and signal intensity. No evidence of thoracic cord lesions. No abnormal enhancement. Paraspinal and other soft tissues: The paraspinal musculature is unremarkable. Disc levels: Disc heights are maintained. No large disc herniation or high-grade stenosis of the thoracic spinal canal. Shallow disc bulges in the lower thoracic spine without stenosis. Mild facet arthrosis at multiple levels in the thoracic spine. No high-grade foraminal stenosis. IMPRESSION: No lesions in the cervical or thoracic spinal cord. No abnormal enhancement. Degenerative changes as above. Disc osteophyte complex at C5-6 resulting in moderate spinal canal stenosis and flattening of the ventral cord without cord signal abnormality. Multilevel foraminal stenosis in the cervical spine, greatest and severe on the right at C4-5. Additional moderate to severe spinal canal stenosis on the right at C5-6. Mild degenerative changes in the thoracic spine. No high-grade spinal canal or foraminal stenosis. Electronically Signed   By: Emily Filbert M.D.   On: 02/27/2024 19:53   MR BRAIN W CONTRAST Result Date: 02/27/2024 CLINICAL DATA:  Concern for demyelinating disease, follow-up of earlier noncontrast MRI. EXAM: MRI HEAD WITH CONTRAST TECHNIQUE: Multiplanar, multiecho pulse sequences of the brain and surrounding structures were obtained with intravenous contrast. CONTRAST:  6mL GADAVIST GADOBUTROL 1 MMOL/ML IV SOLN COMPARISON:  MRI brain earlier same day. FINDINGS: Brain: Redemonstration of numerous foci of FLAIR signal abnormality in the periventricular and subcortical white matter, nonspecific. There are a few foci of signal abnormality in the periventricular white matter demonstrating a perpendicular orientation to the surface of the ventricle. Additional mild FLAIR signal abnormality within the pons. No abnormal intracranial  enhancement. Vascular: Normal contrast opacification of the visualized intracranial arteries. The visualized venous sinuses opacify normally with contrast. Skull and upper cervical spine: Normal marrow signal. Sinuses/Orbits: Mucosal thickening in the right maxillary sinus. Other: Left frontoparietal scalp hematoma and facial soft tissue swelling again noted. IMPRESSION: Redemonstration of nonspecific white matter lesions. No abnormal intracranial enhancement. Electronically Signed   By: Emily Filbert M.D.   On: 02/27/2024 19:13   ECHOCARDIOGRAM COMPLETE Result Date: 02/27/2024    ECHOCARDIOGRAM REPORT   Patient Name:   Christina Meza Date of Exam: 02/27/2024 Medical Rec #:  [id number]          Height:       64.0 in Accession #:    [id number]         Weight:       141.0 lb Date of Birth:  10-21-1966          BSA:          1.686 m Patient Age:    57 years           BP:  103/76 mmHg Patient Gender: F                  HR:           106 bpm. Exam Location:  [facility] Procedure: 2D Echo, Cardiac Doppler, Color Doppler and Strain Analysis (Both            Spectral and Color Flow Doppler were utilized during procedure). Indications:     syncope  History:         Patient has prior history of Echocardiogram examinations, most                  recent 09/08/2021. Arrythmias:Tachycardia,                  Signs/Symptoms:Syncope; Risk Factors:Dyslipidemia.  Sonographer:     Mikki Harbor Referring Phys:  [id number] AMY N COX Diagnosing Phys: Lorine Bears MD IMPRESSIONS  1. Left ventricular ejection fraction, by estimation, is 55 to 60%. The left ventricle has normal function. The left ventricle has no regional wall motion abnormalities. There is moderate asymmetric left ventricular hypertrophy of the basal-septal segment. Left ventricular diastolic parameters are consistent with Grade I diastolic dysfunction (impaired relaxation). The global longitudinal strain is abnormal.  2. Right ventricular systolic function is  normal. The right ventricular size is normal. There is normal pulmonary artery systolic pressure. The estimated right ventricular systolic pressure is 29.0 mmHg.  3. The mitral valve is normal in structure. Mild mitral valve regurgitation. No evidence of mitral stenosis.  4. The aortic valve is normal in structure. Aortic valve regurgitation is not visualized. No aortic stenosis is present.  5. Aortic dilatation noted. There is mild dilatation of the aortic root, measuring 41 mm.  6. The inferior vena cava is normal in size with greater than 50% respiratory variability, suggesting right atrial pressure of 3 mmHg. FINDINGS  Left Ventricle: Left ventricular ejection fraction, by estimation, is 55 to 60%. The left ventricle has normal function. The left ventricle has no regional wall motion abnormalities. Strain was performed and the global longitudinal strain is abnormal. Global longitudinal strain performed but not reported based on interpreter judgement due to suboptimal tracking. The left ventricular internal cavity size was normal in size. There is moderate asymmetric left ventricular hypertrophy of the basal-septal segment. Left ventricular diastolic parameters are consistent with Grade I diastolic dysfunction (impaired relaxation). Right Ventricle: The right ventricular size is normal. No increase in right ventricular wall thickness. Right ventricular systolic function is normal. There is normal pulmonary artery systolic pressure. The tricuspid regurgitant velocity is 2.55 m/s, and  with an assumed right atrial pressure of 3 mmHg, the estimated right ventricular systolic pressure is 29.0 mmHg. Left Atrium: Left atrial size was normal in size. Right Atrium: Right atrial size was normal in size. Pericardium: There is no evidence of pericardial effusion. Mitral Valve: The mitral valve is normal in structure. Mild mitral valve regurgitation. No evidence of mitral valve stenosis. MV peak gradient, 4.1 mmHg. The mean  mitral valve gradient is 2.0 mmHg. Tricuspid Valve: The tricuspid valve is normal in structure. Tricuspid valve regurgitation is mild . No evidence of tricuspid stenosis. Aortic Valve: The aortic valve is normal in structure. Aortic valve regurgitation is not visualized. No aortic stenosis is present. Aortic valve mean gradient measures 2.0 mmHg. Aortic valve peak gradient measures 5.1 mmHg. Aortic valve area, by VTI measures 2.23 cm. Pulmonic Valve: The pulmonic valve was normal in structure. Pulmonic valve regurgitation is mild. No evidence  of pulmonic stenosis. Aorta: The aortic root is normal in size and structure and aortic dilatation noted. There is mild dilatation of the aortic root, measuring 41 mm. Venous: The inferior vena cava is normal in size with greater than 50% respiratory variability, suggesting right atrial pressure of 3 mmHg. IAS/Shunts: No atrial level shunt detected by color flow Doppler.  LEFT VENTRICLE PLAX 2D LVIDd:         4.50 cm     Diastology LVIDs:         3.30 cm     LV e' medial:    7.72 cm/s LV PW:         1.20 cm     LV E/e' medial:  10.9 LV IVS:        1.20 cm     LV e' lateral:   14.40 cm/s LVOT diam:     1.90 cm     LV E/e' lateral: 5.8 LV SV:         40 LV SV Index:   24 LVOT Area:     2.84 cm  LV Volumes (MOD) LV vol d, MOD A4C: 82.3 ml LV vol s, MOD A4C: 37.7 ml LV SV MOD A4C:     82.3 ml RIGHT VENTRICLE RV Basal diam:  3.10 cm RV Mid diam:    2.80 cm RV S prime:     10.00 cm/s TAPSE (M-mode): 1.7 cm LEFT ATRIUM             Index        RIGHT ATRIUM           Index LA diam:        2.70 cm 1.60 cm/m   RA Area:     13.50 cm LA Vol (A2C):   37.4 ml 22.18 ml/m  RA Volume:   31.10 ml  18.44 ml/m LA Vol (A4C):   27.6 ml 16.37 ml/m LA Biplane Vol: 32.4 ml 19.21 ml/m  AORTIC VALVE                    PULMONIC VALVE AV Area (Vmax):    2.32 cm     PV Vmax:       0.74 m/s AV Area (Vmean):   2.12 cm     PV Peak grad:  2.2 mmHg AV Area (VTI):     2.23 cm AV Vmax:           113.00  cm/s AV Vmean:          62.300 cm/s AV VTI:            0.179 m AV Peak Grad:      5.1 mmHg AV Mean Grad:      2.0 mmHg LVOT Vmax:         92.60 cm/s LVOT Vmean:        46.600 cm/s LVOT VTI:          0.141 m LVOT/AV VTI ratio: 0.79  AORTA Ao Root diam: 4.10 cm Ao Asc diam:  3.60 cm MITRAL VALVE               TRICUSPID VALVE MV Area (PHT): 5.54 cm    TR Peak grad:   26.0 mmHg MV Area VTI:   2.21 cm    TR Vmax:        255.00 cm/s MV Peak grad:  4.1 mmHg MV Mean grad:  2.0 mmHg    SHUNTS MV Vmax:  1.01 m/s    Systemic VTI:  0.14 m MV Vmean:      68.7 cm/s   Systemic Diam: 1.90 cm MV Decel Time: 137 msec MV E velocity: 83.80 cm/s MV A velocity: 91.70 cm/s MV E/A ratio:  0.91 Lorine Bears MD Electronically signed by Lorine Bears MD Signature Date/Time: 02/27/2024/2:34:57 PM    Final    MR BRAIN WO CONTRAST Result Date: 02/27/2024 CLINICAL DATA:  Provided history: Mental status change, unknown cause. Additional history provided: Syncopal event. Emesis. History of lupus and hyperlipidemia. EXAM: MRI HEAD WITHOUT CONTRAST TECHNIQUE: Multiplanar, multiecho pulse sequences of the brain and surrounding structures were obtained without intravenous contrast. COMPARISON:  Non-contrast head CT and CT angiogram head/neck 02/26/2024. Brain MRI 07/07/2022. FINDINGS: Brain: Mild generalized cerebral atrophy. Moderate multifocal T2 FLAIR hyperintense signal abnormality within the cerebral white matter and pons, nonspecific and greater than expected for age. 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 No cortical encephalomalacia is identified. There is no acute infarct. No evidence of an intracranial mass. No chronic intracranial blood products. No extra-axial fluid collection. No midline shift. Vascular: Maintained flow voids within the proximal large arterial vessels. Skull and upper cervical spine: No focal worrisome marrow lesion. Slight C3-C4 grade 1 anterolisthesis. Sinuses/Orbits: No mass or acute  finding within the imaged orbits. Prior bilateral ocular lens replacement. Moderate-to-severe mucosal thickening in the right maxillary sinus. Other: Left frontoparietal scalp and left facial hematomas. IMPRESSION: 1. No evidence of an acute intracranial abnormality. 2. Multifocal T2 FLAIR hyperintense signal abnormality within the cerebral white matter and pons, moderate in severity and greater than expected for age. These signal changes are nonspecific and differential considerations include chronic small vessel ischemic disease, sequelae of vasculitis/hypercoagulable state, sequelae of a prior infectious/inflammatory process and sequelae of demyelinating disease, among others. 3. Mild cerebral atrophy. 4. Left frontoparietal scalp and left facial hematomas. 5. Moderate-to-severe mucosal thickening within the right maxillary sinus. Electronically Signed   By: Jackey Loge D.O.   On: 02/27/2024 12:04   X-ray chest PA and lateral Result Date: 02/26/2024 CLINICAL DATA:  Syncope EXAM: CHEST - 2 VIEW COMPARISON:  10/03/2021 FINDINGS: Minimal atelectasis or scar at left base. No consolidation, pleural effusion or pneumothorax. Normal cardiomediastinal silhouette IMPRESSION: No active cardiopulmonary disease. Minimal atelectasis or scar at left base. Electronically Signed   By: Jasmine Pang M.D.   On: 02/26/2024 15:39   CT C-SPINE NO CHARGE Result Date: 02/26/2024 CLINICAL DATA:  Fall EXAM: CT Cervical Spine without contrast TECHNIQUE: Multiplanar CT images of the cervical spine were reconstructed from contemporary CT of the Neck. RADIATION DOSE REDUCTION: This exam was performed according to the departmental dose-optimization program which includes automated exposure control, adjustment of the mA and/or kV according to patient size and/or use of iterative reconstruction technique. CONTRAST:  None or No additional COMPARISON:  None Available. FINDINGS: Alignment: Reversal of cervical lordosis. Facet alignment is  maintained Skull base and vertebrae: No acute fracture. No primary bone lesion or focal pathologic process. Soft tissues and spinal canal: No prevertebral fluid or swelling. No visible canal hematoma. Disc levels: Advanced disc space narrowing C4 through C7. Multilevel facet degenerative changes with right foraminal narrowing C4 through C7. Upper chest: Negative. Other: None IMPRESSION: Reversal of cervical lordosis with multilevel degenerative changes. No acute osseous abnormality. Electronically Signed   By: Jasmine Pang M.D.   On: 02/26/2024 15:38   CT MAXILLOFACIAL W CONTRAST Result Date: 02/26/2024 CLINICAL DATA:  Facial trauma.  Blunt.  Fall today.  EXAM: CT MAXILLOFACIAL WITH CONTRAST TECHNIQUE: Multidetector CT imaging of the maxillofacial structures was performed with intravenous contrast. Multiplanar CT image reconstructions were also generated. RADIATION DOSE REDUCTION: This exam was performed according to the departmental dose-optimization program which includes automated exposure control, adjustment of the mA and/or kV according to patient size and/or use of iterative reconstruction technique. CONTRAST:  75mL OMNIPAQUE IOHEXOL 350 MG/ML SOLN COMPARISON:  None Available. FINDINGS: Osseous: No acute or healing fractures are present. Facial bones are intact. Mandible is intact and located. Orbits: Bilateral lens replacements are noted. Globes and orbits are otherwise unremarkable. Sinuses: Moderate mucosal thickening is present at the floor of the right maxillary sinus. The paranasal sinuses and mastoid air cells are otherwise clear. Soft tissues: Left supraorbital scalp soft tissue swelling/hematoma is present without underlying fracture. The soft tissues of the face are otherwise within normal limits. Limited intracranial: Within normal limits. IMPRESSION: 1. Left supraorbital scalp soft tissue swelling/hematoma without underlying fracture. 2. No acute or healing fractures. 3. Moderate mucosal  thickening at the floor of the right maxillary sinus. Electronically Signed   By: Marin Roberts M.D.   On: 02/26/2024 14:23   CT ANGIO HEAD NECK W WO CM (CODE STROKE) Result Date: 02/26/2024 CLINICAL DATA:  Fall today.  Confusion.  Headache.  Code stroke. EXAM: CT ANGIOGRAPHY HEAD AND NECK WITH AND WITHOUT CONTRAST TECHNIQUE: Multidetector CT imaging of the head and neck was performed using the standard protocol during bolus administration of intravenous contrast. Multiplanar CT image reconstructions and MIPs were obtained to evaluate the vascular anatomy. Carotid stenosis measurements (when applicable) are obtained utilizing NASCET criteria, using the distal internal carotid diameter as the denominator. RADIATION DOSE REDUCTION: This exam was performed according to the departmental dose-optimization program which includes automated exposure control, adjustment of the mA and/or kV according to patient size and/or use of iterative reconstruction technique. CONTRAST:  75mL OMNIPAQUE IOHEXOL 350 MG/ML SOLN COMPARISON:  CT head without contrast 02/26/2024. MR head without contrast 07/07/2022. FINDINGS: CTA NECK FINDINGS Aortic arch: Common origin left common carotid artery in the innominate artery is present. No significant atherosclerotic changes are present at the aortic arch. Great vessel origins are widely patent. No aneurysm or dissection is present. Right carotid system: The right common carotid artery is within normal limits. Atherosclerotic changes are present at the carotid bifurcation. The cervical left ICA is normal. Left carotid system: The left common carotid artery is within normal limits. Minimal calcifications are present at the proximal left ICA. The cervical left ICA is normal. Vertebral arteries: Left common carotid scratched at the left vertebral artery is dominant. Both vertebral arteries originate from the subclavian arteries without significant stenosis. No significant stenosis is  present in either vertebral artery in the neck. Skeleton: Straightening and slight reversal of the normal cervical lordosis is present. No significant listhesis is present. Moderate endplate degenerative changes are present at C4-5, C5-6 and C6-7. Uncovertebral spurring contributes to right foraminal narrowing at these levels. Other neck: The soft tissues of the neck are otherwise unremarkable. Salivary glands are within normal limits. Thyroid is normal. No significant adenopathy is present. No focal mucosal or submucosal lesions are present. Upper chest: The lung apices are clear. The thoracic inlet is within normal limits. Review of the MIP images confirms the above findings CTA HEAD FINDINGS Anterior circulation: The internal carotid arteries are within normal limits from the skull base to the ICA termini. The A1 and M1 segments are normal. Anterior communicating artery is patent. The MCA  bifurcations are normal bilaterally. The ACA and MCA branch vessels are normal bilaterally. No aneurysm is present. Posterior circulation: Minimal atherosclerotic changes are present at the dural margin of the left vertebral artery. PICA origins are visualized and normal. The vertebrobasilar junction and basilar artery normal. The superior cerebellar arteries are normal bilaterally. Both posterior cerebral arteries originate from basilar tip. The PCA branch vessels are normal bilaterally. Venous sinuses: The dural sinuses are patent. The straight sinus and deep cerebral veins are intact. Cortical veins are within normal limits. No significant vascular malformation is evident. Anatomic variants: None Review of the MIP images confirms the above findings IMPRESSION: 1. No significant proximal stenosis, aneurysm, or branch vessel occlusion within the Circle of Willis. 2. Minimal atherosclerotic changes at the carotid bifurcations and proximal left ICA without significant stenosis. 3. Dominant left vertebral artery without  significant stenosis. 4. Moderate endplate degenerative changes at C4-5, C5-6 and C6-7. Uncovertebral spurring contributes to right foraminal narrowing at these levels. Electronically Signed   By: Marin Roberts M.D.   On: 02/26/2024 14:21   CT HEAD CODE STROKE WO CONTRAST Result Date: 02/26/2024 CLINICAL DATA:  Code stroke. Left-sided weakness. Fall today. Confusion after the fall. Headache. EXAM: CT HEAD WITHOUT CONTRAST TECHNIQUE: Contiguous axial images were obtained from the base of the skull through the vertex without intravenous contrast. RADIATION DOSE REDUCTION: This exam was performed according to the departmental dose-optimization program which includes automated exposure control, adjustment of the mA and/or kV according to patient size and/or use of iterative reconstruction technique. COMPARISON:  MR head without and with contrast 03/07/2022. FINDINGS: Brain: No acute infarct, hemorrhage, or mass lesion is present. Mild atrophy is present. Periventricular and scattered subcortical foci of white matter hypoattenuation are moderately advanced for age, similar the prior study. The brainstem and cerebellum are within normal limits. Midline structures are within normal limits. Vascular: Minimal vascular calcifications are present within the cavernous internal carotid arteries bilaterally. No hyperdense vessel is present. Skull: Moderate left supraorbital scalp soft tissue swelling and hematoma is present. No underlying fracture is present. No other significant extracranial soft tissue injury is present. Sinuses/Orbits: The paranasal sinuses and mastoid air cells are clear. Bilateral lens replacements are noted. Globes and orbits are otherwise unremarkable. ASPECTS [facility] Stroke Program Early CT Score) - Ganglionic level infarction (caudate, lentiform nuclei, internal capsule, insula, M1-M3 cortex): 7/7 - Supraganglionic infarction (M4-M6 cortex): 3/3 Total score (0-10 with 10 being normal): 10/10  IMPRESSION: 1. No acute intracranial abnormality or significant interval change. 2. Moderate left supraorbital scalp soft tissue swelling and hematoma without underlying fracture. 3. Mild atrophy and white matter disease is moderately advanced for age. This likely reflects the sequela of chronic microvascular ischemia. 4. Aspects 10/10 Electronically Signed   By: Marin Roberts M.D.   On: 02/26/2024 14:01    Microbiology: Results for orders placed or performed during the hospital encounter of 09/12/21  Covid-19, Flu A+B ([facility])     Status: None   Collection Time: 09/12/21  1:29 PM   Specimen: Nasopharyngeal   Naso  Result Value Ref Range Status   SARS-CoV-2, NAA Not Detected Not Detected Final   Influenza A, NAA Not Detected Not Detected Final   Influenza B, NAA Not Detected Not Detected Final   Test Information: Comment  Final    Comment: This nucleic acid amplification test was developed and its performance characteristics determined by [employer]. Nucleic acid amplification tests include RT-PCR and TMA. This test has not been FDA cleared or  approved. This test has been authorized by FDA under an Emergency Use Authorization (EUA). This test is only authorized for the duration of time the declaration that circumstances exist justifying the authorization of the emergency use of in vitro diagnostic tests for detection of SARS-CoV-2 virus and/or diagnosis of COVID-19 infection under section 564(b)(1) of the Act, 21 U.S.C. 409WJX-9(J) (1), unless the authorization is terminated or revoked sooner. When diagnostic testing is negative, the possibility of a false negative result should be considered in the context of a patient's recent exposures and the presence of clinical signs and symptoms consistent with COVID-19. An individual without symptoms of COVID-19 and who is not shedding SARS-CoV-2 virus wo uld expect to have a negative (not detected) result in this assay.      Labs: CBC: Recent Labs  Lab 02/26/24 1413 02/27/24 0342  WBC 4.4 2.5*  NEUTROABS 3.4  --   HGB 11.7* 11.8*  HCT 35.2* 35.4*  MCV 97.2 98.9  PLT 161 173   Basic Metabolic Panel: Recent Labs  Lab 02/26/24 1413 02/27/24 0342  NA 133* 136  K 4.1 4.2  CL 105 106  CO2 25 26  GLUCOSE 105* 91  BUN 23* 15  CREATININE 0.60 0.83  CALCIUM 8.3* 8.3*   Liver Function Tests: Recent Labs  Lab 02/26/24 1413  AST 24  ALT 19  ALKPHOS 61  BILITOT 0.6  PROT 5.7*  ALBUMIN 3.3*   CBG: Recent Labs  Lab 02/26/24 1337 02/28/24 0514  GLUCAP 68* 85    Discharge time spent: greater than 30 minutes.  Signed: Enedina Finner, MD [facility] 02/28/2024

## 2024-02-28 NOTE — Progress Notes (Signed)
 AVS reviewed and given to patient. PIV removed. All questions answered.

## 2024-02-28 NOTE — Progress Notes (Signed)
 Progress Note  Patient Name: Christina Meza Date of Encounter: 02/28/2024  Primary Cardiologist: Graciela Husbands  Subjective   No further syncopal episodes.  No chest pain, palpitations, dizziness, presyncope, or syncope.  Inpatient Medications    Scheduled Meds:  DULoxetine  60 mg Oral Daily   folic acid  1 mg Oral Daily   hydroxychloroquine  200 mg Oral Once per day on Monday Wednesday Friday   And   hydroxychloroquine  200 mg Oral 2 times per day on Sunday Tuesday Thursday Saturday   metoprolol tartrate  37.5 mg Oral BID   pregabalin  25 mg Oral BID   progesterone  100 mg Oral QHS   rOPINIRole  1 mg Oral QHS   sodium chloride flush  3 mL Intravenous Q12H   valACYclovir  500 mg Oral QHS   Continuous Infusions:  PRN Meds: acetaminophen **OR** acetaminophen, albuterol, clonazePAM, diclofenac Sodium, ondansetron **OR** ondansetron (ZOFRAN) IV, senna-docusate   Vital Signs    Vitals:   02/27/24 2039 02/28/24 0015 02/28/24 0410 02/28/24 0500  BP: 108/73 104/75 116/75   Pulse: 92 84 88   Resp: 16 16 19    Temp: 98 F (36.7 C)  98.2 F (36.8 C)   TempSrc:   Oral   SpO2: 96% 96% 97%   Weight:    62.4 kg  Height:       No intake or output data in the 24 hours ending 02/28/24 0746 Filed Weights   02/26/24 1832 02/28/24 0500  Weight: 64 kg 62.4 kg    Telemetry    Sinus rhythm with sinus tachycardia rates ranging are largely in the 80s to low 100s with brief episode into the 130s bpm, no evidence of significant arrhythmia, high-grade AV block, or prolonged pauses - Personally Reviewed  ECG    No new tracings - Personally Reviewed  Physical Exam   GEN: No acute distress.  Laceration noted along the lower lip, contusion above the left orbit  Neck: No JVD. Cardiac: RRR, no murmurs, rubs, or gallops.  Respiratory: Clear to auscultation bilaterally.  GI: Soft, nontender, non-distended.   MS: No edema; No deformity. Neuro:  Alert and oriented x 3; Nonfocal.  Psych:  Normal affect.  Labs    Chemistry Recent Labs  Lab 02/26/24 1413 02/27/24 0342  NA 133* 136  K 4.1 4.2  CL 105 106  CO2 25 26  GLUCOSE 105* 91  BUN 23* 15  CREATININE 0.60 0.83  CALCIUM 8.3* 8.3*  PROT 5.7*  --   ALBUMIN 3.3*  --   AST 24  --   ALT 19  --   ALKPHOS 61  --   BILITOT 0.6  --   GFRNONAA >60 >60  ANIONGAP 3* 4*     Hematology Recent Labs  Lab 02/26/24 1413 02/27/24 0342  WBC 4.4 2.5*  RBC 3.62* 3.58*  HGB 11.7* 11.8*  HCT 35.2* 35.4*  MCV 97.2 98.9  MCH 32.3 33.0  MCHC 33.2 33.3  RDW 13.9 14.1  PLT 161 173    Cardiac EnzymesNo results for input(s): "TROPONINI" in the last 168 hours. No results for input(s): "TROPIPOC" in the last 168 hours.   BNPNo results for input(s): "BNP", "PROBNP" in the last 168 hours.   DDimer No results for input(s): "DDIMER" in the last 168 hours.   Radiology    MR CERVICAL SPINE W WO CONTRAST Result Date: 02/27/2024 IMPRESSION: No lesions in the cervical or thoracic spinal cord. No abnormal enhancement. Degenerative changes as above.  Disc osteophyte complex at C5-6 resulting in moderate spinal canal stenosis and flattening of the ventral cord without cord signal abnormality. Multilevel foraminal stenosis in the cervical spine, greatest and severe on the right at C4-5. Additional moderate to severe spinal canal stenosis on the right at C5-6. Mild degenerative changes in the thoracic spine. No high-grade spinal canal or foraminal stenosis. Electronically Signed   By: Emily Filbert M.D.   On: 02/27/2024 19:53   MR THORACIC SPINE W WO CONTRAST Result Date: 02/27/2024 IMPRESSION: No lesions in the cervical or thoracic spinal cord. No abnormal enhancement. Degenerative changes as above. Disc osteophyte complex at C5-6 resulting in moderate spinal canal stenosis and flattening of the ventral cord without cord signal abnormality. Multilevel foraminal stenosis in the cervical spine, greatest and severe on the right at C4-5.  Additional moderate to severe spinal canal stenosis on the right at C5-6. Mild degenerative changes in the thoracic spine. No high-grade spinal canal or foraminal stenosis. Electronically Signed   By: Emily Filbert M.D.   On: 02/27/2024 19:53   MR BRAIN W CONTRAST Result Date: 02/27/2024 IMPRESSION: Redemonstration of nonspecific white matter lesions. No abnormal intracranial enhancement. Electronically Signed   By: Emily Filbert M.D.   On: 02/27/2024 19:13    MR BRAIN WO CONTRAST Result Date: 02/27/2024 IMPRESSION: 1. No evidence of an acute intracranial abnormality. 2. Multifocal T2 FLAIR hyperintense signal abnormality within the cerebral white matter and pons, moderate in severity and greater than expected for age. These signal changes are nonspecific and differential considerations include chronic small vessel ischemic disease, sequelae of vasculitis/hypercoagulable state, sequelae of a prior infectious/inflammatory process and sequelae of demyelinating disease, among others. 3. Mild cerebral atrophy. 4. Left frontoparietal scalp and left facial hematomas. 5. Moderate-to-severe mucosal thickening within the right maxillary sinus. Electronically Signed   By: Jackey Loge D.O.   On: 02/27/2024 12:04   X-ray chest PA and lateral Result Date: 02/26/2024 IMPRESSION: No active cardiopulmonary disease. Minimal atelectasis or scar at left base. Electronically Signed   By: Jasmine Pang M.D.   On: 02/26/2024 15:39   CT C-SPINE NO CHARGE Result Date: 02/26/2024 IMPRESSION: Reversal of cervical lordosis with multilevel degenerative changes. No acute osseous abnormality. Electronically Signed   By: Jasmine Pang M.D.   On: 02/26/2024 15:38   CT MAXILLOFACIAL W CONTRAST Result Date: 02/26/2024 IMPRESSION: 1. Left supraorbital scalp soft tissue swelling/hematoma without underlying fracture. 2. No acute or healing fractures. 3. Moderate mucosal thickening at the floor of the right maxillary sinus.  Electronically Signed   By: Marin Roberts M.D.   On: 02/26/2024 14:23   CT ANGIO HEAD NECK W WO CM (CODE STROKE) Result Date: 02/26/2024 IMPRESSION: 1. No significant proximal stenosis, aneurysm, or branch vessel occlusion within the Circle of Willis. 2. Minimal atherosclerotic changes at the carotid bifurcations and proximal left ICA without significant stenosis. 3. Dominant left vertebral artery without significant stenosis. 4. Moderate endplate degenerative changes at C4-5, C5-6 and C6-7. Uncovertebral spurring contributes to right foraminal narrowing at these levels. Electronically Signed   By: Marin Roberts M.D.   On: 02/26/2024 14:21   CT HEAD CODE STROKE WO CONTRAST Result Date: 02/26/2024 IMPRESSION: 1. No acute intracranial abnormality or significant interval change. 2. Moderate left supraorbital scalp soft tissue swelling and hematoma without underlying fracture. 3. Mild atrophy and white matter disease is moderately advanced for age. This likely reflects the sequela of chronic microvascular ischemia. 4. Aspects 10/10 Electronically Signed   By: Cristal Deer  Mattern M.D.   On: 02/26/2024 14:01    Cardiac Studies   2D echo 02/27/2024: 1. Left ventricular ejection fraction, by estimation, is 55 to 60%. The  left ventricle has normal function. The left ventricle has no regional  wall motion abnormalities. There is moderate asymmetric left ventricular  hypertrophy of the basal-septal  segment. Left ventricular diastolic parameters are consistent with Grade I  diastolic dysfunction (impaired relaxation). The global longitudinal  strain is abnormal.   2. Right ventricular systolic function is normal. The right ventricular  size is normal. There is normal pulmonary artery systolic pressure. The  estimated right ventricular systolic pressure is 29.0 mmHg.   3. The mitral valve is normal in structure. Mild mitral valve  regurgitation. No evidence of mitral stenosis.   4. The  aortic valve is normal in structure. Aortic valve regurgitation is  not visualized. No aortic stenosis is present.   5. Aortic dilatation noted. There is mild dilatation of the aortic root,  measuring 41 mm.   6. The inferior vena cava is normal in size with greater than 50%  respiratory variability, suggesting right atrial pressure of 3 mmHg.  __________  2D echo 09/08/2021: 1. Left ventricular ejection fraction, by estimation, is 50 to 55%. The  left ventricle has low normal function. The left ventricle demonstrates  global hypokinesis. Left ventricular diastolic parameters are consistent  with Grade I diastolic dysfunction  (impaired relaxation). The average left ventricular global longitudinal  strain is -14.7 %.   2. Right ventricular systolic function is normal. The right ventricular  size is normal. There is normal pulmonary artery systolic pressure. The  estimated right ventricular systolic pressure is 22.3 mmHg.   3. The mitral valve is normal in structure. Mild mitral valve  regurgitation. No evidence of mitral stenosis.   4. The aortic valve is normal in structure. Trileaflet. Aortic valve  regurgitation is mild. No aortic stenosis is present.   5. There is dilatation of the aortic root, measuring 39 mm. There is mild  dilatation of the ascending aorta, measuring 40 mm.   Comparison(s): LVEF 50-55%, Asc. Aorta 3.7 cm.  __________   Coronary CTA 08/28/2020: Aorta:  Normal size.  No calcifications.  No dissection.   Aortic Valve:  Trileaflet.  No calcifications.   Coronary Arteries:  Normal coronary origin.  Right dominance.   RCA is a large dominant artery that gives rise to PDA and PLA. There is no plaque.   Left main is a large artery that gives rise to LAD and LCX arteries.   LAD is a large vessel that bifurcates in its mid segment. It has no plaque.   LCX is a non-dominant artery that gives rise to a large first obtuse marginal branch. There is no plaque.    Other findings:   Normal pulmonary vein drainage into the left atrium.   Normal left atrial appendage without a thrombus.   Normal size of the pulmonary artery.   IMPRESSION: 1. Coronary calcium score of 0. Patient is low risk for near term coronary events 2. Normal coronary origin with right dominance. 3. No evidence of CAD. 4. CAD-RADS 0. Consider non-atherosclerotic causes of chest pain. __________   48-hour Holter 12/2017: Normal sinus rhythm. Rare PVCs and PACs. Frequent sinus tachycardia with an average heart rate of 101 bpm.  54% of total beats were in the tachycardia range. __________   2D echo 12/17/2016: - Left ventricle: The cavity size was normal. Systolic function was  normal. The estimated ejection fraction was in the range of 50%    to 55%. Wall motion was normal; there were no regional wall    motion abnormalities. Doppler parameters are consistent with    abnormal left ventricular relaxation (grade 1 diastolic    dysfunction).  - Aortic root: The aortic root was mildly dilated, 3.4 cm  - Ascending aorta: The ascending aorta was mildly dilated, 3.7 cm  - Mitral valve: There was mild regurgitation.  - Left atrium: The atrium was normal in size.  - Right ventricle: Systolic function was normal.  - Pulmonary arteries: Systolic pressure was within the normal    range.   Patient Profile     58 y.o. female with history of  inappropriate sinus tachycardia, prolonged QT, SLE, migraine disorder, depression, and Raynaud's who is being seen today for the evaluation of syncope at the request of Dr. Allena Katz.   Assessment & Plan    1. Syncope: -Appears to be consistent with vasovagal etiology possibly exacerbated by dehydration in the setting of emesis and diarrhea precipitating event -Patient had also recently taken her morning metoprolol with a protein shake -No evidence of arrhythmia, high-grade AV block, or prolonged pauses on telemetry to date, continue to  monitor -Monitor on telemetry -Order placed for Zio patch, respiratory therapy aware and will place later today -Echo without significant structural abnormality -Nonspecific findings of abnormal MRI per neurology -EEG pending   2.  Inappropriate sinus tachycardia: -She requests to go back down on Lopressor to 25 mg twice daily for slight buffer in blood pressure     For questions or updates, please contact [facility] Please consult [email or web address] for contact info under Cardiology/STEMI.    Signed, Eula Listen, PA-C [facility] Pager: [phone] 02/28/2024, 7:46 AM

## 2024-02-28 NOTE — Progress Notes (Signed)
 Transition of Care [facility]) - Inpatient Brief Assessment   Patient Details  Name: Christina Meza MRN: [id number] Date of Birth: 02/14/1966  Transition of Care [facility] CM/SW Contact:    Truddie Hidden, RN Phone Number: 02/28/2024, 12:55 PM   Clinical Narrative: TOC continuing to follow patient's progress throughout discharge planning.   Transition of Care Asessment: Insurance and Status: Insurance coverage has been reviewed Patient has primary care physician: Yes   Prior level of function:: independent Prior/Current Home Services: No current home services Social Drivers of Health Review: SDOH reviewed no interventions necessary Readmission risk has been reviewed: Yes Transition of care needs: no transition of care needs at this time

## 2024-02-28 NOTE — Procedures (Signed)
 Routine EEG Report  Christina Meza is a 58 y.o. female with a history of syncope who is undergoing an EEG to evaluate for seizures.  Report: This EEG was acquired with electrodes placed according to the International 10-20 electrode system (including Fp1, Fp2, F3, F4, C3, C4, P3, P4, O1, O2, T3, T4, T5, T6, A1, A2, Fz, Cz, Pz). The following electrodes were missing or displaced: none.  The occipital dominant rhythm was 9 Hz. This activity is reactive to stimulation. Drowsiness was manifested by background fragmentation; deeper stages of sleep were identified by K complexes and sleep spindles. There was no focal slowing. There were no interictal epileptiform discharges. There were no electrographic seizures identified. There was no abnormal response to photic stimulation or hyperventilation.   Impression: This EEG was obtained while awake and asleep and is normal.    Clinical Correlation: Normal EEGs, however, do not rule out epilepsy.  Bing Neighbors, MD [facility] [phone]  If 7pm- 7am, please page neurology on call as listed in AMION.

## 2024-02-29 LAB — BETA-2-GLYCOPROTEIN I ABS, IGG/M/A
Beta-2 Glyco I IgG: <9 GPI IgG units (ref 0–20)
Beta-2-Glycoprotein I IgA: <9 GPI IgA units (ref 0–25)
Beta-2-Glycoprotein I IgM: <9 GPI IgM units (ref 0–32)

## 2024-02-29 LAB — LUPUS ANTICOAGULANT PANEL
DRVVT: 32.4 s (ref 0.0–47.0)
PTT Lupus Anticoagulant: 38.3 s (ref 0.0–43.5)

## 2024-02-29 LAB — HOMOCYSTEINE: Homocysteine: 13.2 umol/L (ref 0.0–14.5)

## 2024-02-29 LAB — CARDIOLIPIN ANTIBODIES, IGG, IGM, IGA
Anticardiolipin IgA: <9 U/mL (ref 0–11)
Anticardiolipin IgG: <9 GPL U/mL (ref 0–14)
Anticardiolipin IgM: <9 [MPL'U]/mL (ref 0–12)

## 2024-02-29 LAB — PROTEIN S ACTIVITY: Protein S Activity: 81 % (ref 63–140)

## 2024-02-29 LAB — PROTEIN S, TOTAL: Protein S Ag, Total: 91 % (ref 60–150)

## 2024-02-29 LAB — PROTEIN C ACTIVITY: Protein C Activity: 120 % (ref 73–180)

## 2024-03-01 LAB — PROTEIN C, TOTAL: Protein C, Total: 91 % (ref 60–150)

## 2024-03-05 LAB — PROTHROMBIN GENE MUTATION

## 2024-03-05 LAB — FACTOR 5 LEIDEN

## 2024-03-06 ENCOUNTER — Inpatient Hospital Stay: Admitting: Family Medicine

## 2024-03-08 ENCOUNTER — Encounter: Payer: Self-pay | Admitting: Family Medicine

## 2024-03-08 ENCOUNTER — Ambulatory Visit (INDEPENDENT_AMBULATORY_CARE_PROVIDER_SITE_OTHER): Admitting: Family Medicine

## 2024-03-08 VITALS — BP 110/64 | HR 98 | Ht 64.0 in | Wt 141.0 lb

## 2024-03-08 DIAGNOSIS — R55 Syncope and collapse: Secondary | ICD-10-CM

## 2024-03-08 DIAGNOSIS — F0781 Postconcussional syndrome: Secondary | ICD-10-CM | POA: Diagnosis not present

## 2024-03-08 DIAGNOSIS — R519 Headache, unspecified: Secondary | ICD-10-CM | POA: Diagnosis not present

## 2024-03-08 MED ORDER — NURTEC 75 MG PO TBDP: 75.0000 mg | ORAL_TABLET | Freq: Every day | ORAL | Status: DC | PRN

## 2024-03-08 NOTE — Patient Instructions (Addendum)
 Thank you for coming to the office today.  Likely possible post concussive syndrome with headaches  Try the nurtec dissolving tab, 1 tab every 24-48 hours as needed, ideally 48 hours between, 2 tabs in the sample.  If it is migraine related at all, that would be an effective treatment and should work. If it is only concussion or tension related, then it may not work.  Okay to take the tylenol  Discontinued medications - Amlodipine 2.5 (RLS) - Metoprolol 12.5 (for tremors) - Minoxidil for hair  Please schedule a Follow-up Appointment to: Return if symptoms worsen or fail to improve.  If you have any other questions or concerns, please feel free to call the office or send a message through MyChart. You may also schedule an earlier appointment if necessary.  Additionally, you may be receiving a survey about your experience at our office within a few days to 1 week by e-mail or mail. We value your feedback.  Saralyn Pilar, DO [facility], [place]

## 2024-03-08 NOTE — Progress Notes (Signed)
 Subjective:    Patient ID: Christina Meza, female    DOB: 05-Sep-1966, 58 y.o.   MRN: [id number]  Christina Meza is a 58 y.o. female presenting on 03/08/2024 for syncope, suspected vasovagal   HPI  Discussed the use of AI scribe software for clinical note transcription with the patient, who gave verbal consent to proceed.  History of Present Illness   Christina Meza is a 58 year old female who presents with syncope and post-concussive symptoms.       HOSPITAL FOLLOW-UP VISIT  Hospital/Location: [facility] Date of Admission: 02/26/24 Date of Discharge: 02/28/24 Transitions of care telephone call: Not completed  Reason for Admission: Syncope  FOLLOW-UP  - Hospital H&P and Discharge Summary have been reviewed - Patient presents today about 9 days after recent hospitalization.  She experienced a syncope episode following her morning routine, which included breakfast and walking her dogs. She felt nauseous after taking her medications and vomited violently twice. She then felt the urge to have diarrhea but does not recall reaching the bathroom. She was found on all fours, dazed, with blood from her mouth and chin, and was taken to the hospital by [facility]. At the hospital, she underwent a comprehensive workup including CT, MRI, echo, and EEG. A Zio patch was placed for cardiac monitoring, and she is awaiting results to schedule a follow-up with cardiology.  Post-syncope, she has been experiencing persistent headaches, described as pressure that worsens when lying down. She reports light sensitivity and has been trying to sleep sitting up to alleviate symptoms. Tylenol provides some relief. She attempted walking on a treadmill but had to stop due to increased headache pain. She sustained a contusion and laceration to her lip and chin during the fall, with additional bruising on her left cheek and a knot on her head, which has been slowly resolving.  Her medications were adjusted following  the episode. Amlodipine, initially prescribed for restless leg syndrome, was discontinued, as was metoprolol, which was used for essential tremor. Minoxidil, used for hair loss, was also paused. She has not noticed significant changes since stopping these medications.  She reports persistent headaches with pressure, light sensitivity, and nausea. No history of migraine diagnosis.   Currently still has Left sided headaches and pressure symptoms, sleep sitting up sometimes and will end up laying down anyway, and can have inc pressure headache Thought to have had concussion, may have post concussive syndrome, she has sensitivity to light, seems new  Doing lighter work right now but has resumed work  - Today reports overall has done well after discharge. Symptoms of syncope have resolved but has headaches still.   - New medications on discharge: none - Changes to current meds on discharge:  Discontinued meds - Amlodipine 2.5 (RLS), Metoprolol (Tremors), Minoxidil hair loss   FOR A&P I have reviewed the discharge medication list, and have reconciled the current and discharge medications today.     03/08/2024    9:37 AM 01/31/2024    1:16 PM 12/23/2022   10:47 AM  Depression screen PHQ 2/9  Decreased Interest 1 1 1   Down, Depressed, Hopeless 0 1 1  PHQ - 2 Score 1 2 2   Altered sleeping 0 0 1  Tired, decreased energy 2 1 2   Change in appetite 0 0 1  Feeling bad or failure about yourself  0 0 0  Trouble concentrating 0 1 0  Moving slowly or fidgety/restless 0 0 0  Suicidal thoughts 0 0 0  PHQ-9 Score 3  4 6  Difficult doing work/chores Not difficult at all Not difficult at all Not difficult at all       03/08/2024    9:37 AM 12/23/2022   10:47 AM 04/21/2022    9:35 AM 06/17/2021    1:29 PM  GAD 7 : Generalized Anxiety Score  Nervous, Anxious, on Edge 0 2 0 3  Control/stop worrying 0 2 0 3  Worry too much - different things 0 2 1 3   Trouble relaxing 0 2 1 3   Restless 0 2 1 1   Easily  annoyed or irritable 2 2 1 3   Afraid - awful might happen 0 1 0 2  Total GAD 7 Score 2 13 4 18   Anxiety Difficulty Not difficult at all Somewhat difficult Somewhat difficult Somewhat difficult    Social History   Tobacco Use   Smoking status: Never   Smokeless tobacco: Never  Vaping Use   Vaping status: Never Used  Substance Use Topics   Alcohol use: Yes    Alcohol/week: 0.0 - 1.0 standard drinks of alcohol   Drug use: No    Review of Systems Per HPI unless specifically indicated above     Objective:    BP 110/64 (BP Location: Left Arm, Patient Position: Sitting, Cuff Size: Normal)   Pulse 98   Ht 5\' 4"  (1.626 m)   Wt 141 lb (64 kg)   LMP 07/15/2015 (Within Days)   SpO2 97%   BMI 24.20 kg/m   Wt Readings from Last 3 Encounters:  03/08/24 141 lb (64 kg)  02/28/24 137 lb 9.6 oz (62.4 kg)  02/14/24 141 lb (64 kg)    Physical Exam Vitals and nursing note reviewed.  Constitutional:      General: She is not in acute distress.    Appearance: She is well-developed. She is not diaphoretic.     Comments: Well-appearing, comfortable, cooperative  HENT:     Head: Normocephalic.     Comments: Ecchymopsis left forehead orbital region and cheek area, some edema localized with hematoma Eyes:     General:        Right eye: No discharge.        Left eye: No discharge.     Conjunctiva/sclera: Conjunctivae normal.  Neck:     Thyroid: No thyromegaly.  Cardiovascular:     Rate and Rhythm: Normal rate and regular rhythm.     Heart sounds: Normal heart sounds. No murmur heard.    Comments: ZIO patch on Pulmonary:     Effort: Pulmonary effort is normal. No respiratory distress.     Breath sounds: Normal breath sounds. No wheezing or rales.  Musculoskeletal:        General: Normal range of motion.     Cervical back: Normal range of motion and neck supple.  Lymphadenopathy:     Cervical: No cervical adenopathy.  Skin:    General: Skin is warm and dry.     Findings: Bruising  present. No erythema or rash.  Neurological:     Mental Status: She is alert and oriented to person, place, and time.  Psychiatric:        Behavior: Behavior normal.     Comments: Well groomed, good eye contact, normal speech and thoughts    I have personally reviewed the radiology report from 02/27/24 on MRI.  CLINICAL DATA:  Concern for demyelinating disease, follow-up of earlier noncontrast MRI.   EXAM: MRI HEAD WITH CONTRAST   TECHNIQUE: Multiplanar, multiecho pulse sequences of  the brain and surrounding structures were obtained with intravenous contrast.   CONTRAST:  6mL GADAVIST GADOBUTROL 1 MMOL/ML IV SOLN   COMPARISON:  MRI brain earlier same day.   FINDINGS: Brain: Redemonstration of numerous foci of FLAIR signal abnormality in the periventricular and subcortical white matter, nonspecific. There are a few foci of signal abnormality in the periventricular white matter demonstrating a perpendicular orientation to the surface of the ventricle. Additional mild FLAIR signal abnormality within the pons. No abnormal intracranial enhancement.   Vascular: Normal contrast opacification of the visualized intracranial arteries. The visualized venous sinuses opacify normally with contrast.   Skull and upper cervical spine: Normal marrow signal.   Sinuses/Orbits: Mucosal thickening in the right maxillary sinus.   Other: Left frontoparietal scalp hematoma and facial soft tissue swelling again noted.   IMPRESSION: Redemonstration of nonspecific white matter lesions. No abnormal intracranial enhancement.     Electronically Signed   By: Emily Filbert M.D.   On: 02/27/2024 19:13     ECHO   1. Left ventricular ejection fraction, by estimation, is 55 to 60%. The  left ventricle has normal function. The left ventricle has no regional  wall motion abnormalities. There is moderate asymmetric left ventricular  hypertrophy of the basal-septal  segment. Left ventricular  diastolic parameters are consistent with Grade I  diastolic dysfunction (impaired relaxation). The global longitudinal  strain is abnormal.   2. Right ventricular systolic function is normal. The right ventricular  size is normal. There is normal pulmonary artery systolic pressure. The  estimated right ventricular systolic pressure is 29.0 mmHg.   3. The mitral valve is normal in structure. Mild mitral valve  regurgitation. No evidence of mitral stenosis.   4. The aortic valve is normal in structure. Aortic valve regurgitation is  not visualized. No aortic stenosis is present.   5. Aortic dilatation noted. There is mild dilatation of the aortic root,  measuring 41 mm.   6. The inferior vena cava is normal in size with greater than 50%  respiratory variability, suggesting right atrial pressure of 3 mmHg.   Results for orders placed or performed during the hospital encounter of 02/26/24  CBG monitoring, ED   Collection Time: 02/26/24  1:37 PM  Result Value Ref Range   Glucose-Capillary 68 (L) 70 - 99 mg/dL  Protime-INR   Collection Time: 02/26/24  2:13 PM  Result Value Ref Range   Prothrombin Time 12.8 11.4 - 15.2 seconds   INR 0.9 0.8 - 1.2  APTT   Collection Time: 02/26/24  2:13 PM  Result Value Ref Range   aPTT 28 24 - 36 seconds  CBC   Collection Time: 02/26/24  2:13 PM  Result Value Ref Range   WBC 4.4 4.0 - 10.5 K/uL   RBC 3.62 (L) 3.87 - 5.11 MIL/uL   Hemoglobin 11.7 (L) 12.0 - 15.0 g/dL   HCT 16.1 (L) 09.6 - 04.5 %   MCV 97.2 80.0 - 100.0 fL   MCH 32.3 26.0 - 34.0 pg   MCHC 33.2 30.0 - 36.0 g/dL   RDW 40.9 81.1 - 91.4 %   Platelets 161 150 - 400 K/uL   nRBC 0.0 0.0 - 0.2 %  Differential   Collection Time: 02/26/24  2:13 PM  Result Value Ref Range   Neutrophils Relative % 77 %   Neutro Abs 3.4 1.7 - 7.7 K/uL   Lymphocytes Relative 10 %   Lymphs Abs 0.4 (L) 0.7 - 4.0 K/uL   Monocytes Relative  12 %   Monocytes Absolute 0.6 0.1 - 1.0 K/uL   Eosinophils Relative 1  %   Eosinophils Absolute 0.0 0.0 - 0.5 K/uL   Basophils Relative 0 %   Basophils Absolute 0.0 0.0 - 0.1 K/uL   Immature Granulocytes 0 %   Abs Immature Granulocytes 0.01 0.00 - 0.07 K/uL  Comprehensive metabolic panel   Collection Time: 02/26/24  2:13 PM  Result Value Ref Range   Sodium 133 (L) 135 - 145 mmol/L   Potassium 4.1 3.5 - 5.1 mmol/L   Chloride 105 98 - 111 mmol/L   CO2 25 22 - 32 mmol/L   Glucose, Bld 105 (H) 70 - 99 mg/dL   BUN 23 (H) 6 - 20 mg/dL   Creatinine, Ser 1.61 0.44 - 1.00 mg/dL   Calcium 8.3 (L) 8.9 - 10.3 mg/dL   Total Protein 5.7 (L) 6.5 - 8.1 g/dL   Albumin 3.3 (L) 3.5 - 5.0 g/dL   AST 24 15 - 41 U/L   ALT 19 0 - 44 U/L   Alkaline Phosphatase 61 38 - 126 U/L   Total Bilirubin 0.6 0.0 - 1.2 mg/dL   GFR, Estimated >09 >60 mL/min   Anion gap 3 (L) 5 - 15  Ethanol   Collection Time: 02/26/24  2:13 PM  Result Value Ref Range   Alcohol, Ethyl (B) <10 <10 mg/dL  Troponin I (High Sensitivity)   Collection Time: 02/26/24  4:08 PM  Result Value Ref Range   Troponin I (High Sensitivity) 14 <18 ng/L  Basic metabolic panel   Collection Time: 02/27/24  3:42 AM  Result Value Ref Range   Sodium 136 135 - 145 mmol/L   Potassium 4.2 3.5 - 5.1 mmol/L   Chloride 106 98 - 111 mmol/L   CO2 26 22 - 32 mmol/L   Glucose, Bld 91 70 - 99 mg/dL   BUN 15 6 - 20 mg/dL   Creatinine, Ser 4.54 0.44 - 1.00 mg/dL   Calcium 8.3 (L) 8.9 - 10.3 mg/dL   GFR, Estimated >09 >81 mL/min   Anion gap 4 (L) 5 - 15  CBC   Collection Time: 02/27/24  3:42 AM  Result Value Ref Range   WBC 2.5 (L) 4.0 - 10.5 K/uL   RBC 3.58 (L) 3.87 - 5.11 MIL/uL   Hemoglobin 11.8 (L) 12.0 - 15.0 g/dL   HCT 19.1 (L) 47.8 - 29.5 %   MCV 98.9 80.0 - 100.0 fL   MCH 33.0 26.0 - 34.0 pg   MCHC 33.3 30.0 - 36.0 g/dL   RDW 62.1 30.8 - 65.7 %   Platelets 173 150 - 400 K/uL   nRBC 0.0 0.0 - 0.2 %  Troponin I (High Sensitivity)   Collection Time: 02/27/24  3:42 AM  Result Value Ref Range   Troponin I (High  Sensitivity) 16 <18 ng/L  ECHOCARDIOGRAM COMPLETE   Collection Time: 02/27/24  2:22 PM  Result Value Ref Range   Weight 2,255.75 oz   Height 64 in   BP 103/76 mmHg   Ao pk vel 1.13 m/s   AV Area VTI 2.23 cm2   AR max vel 2.32 cm2   AV Mean grad 2.0 mmHg   AV Peak grad 5.1 mmHg   Single Plane A4C EF 54.2 %   S' Lateral 3.30 cm   AV Area mean vel 2.12 cm2   Area-P 1/2 5.54 cm2   MV VTI 2.21 cm2   Est EF 55 - 60%  Antithrombin III   Collection Time: 02/27/24  4:45 PM  Result Value Ref Range   AntiThromb III Func 112 75 - 120 %  Protein C activity   Collection Time: 02/27/24  4:45 PM  Result Value Ref Range   Protein C Activity 120 73 - 180 %  Protein C, total   Collection Time: 02/27/24  4:45 PM  Result Value Ref Range   Protein C, Total 91 60 - 150 %  Protein S activity   Collection Time: 02/27/24  4:45 PM  Result Value Ref Range   Protein S Activity 81 63 - 140 %  Protein S, total   Collection Time: 02/27/24  4:45 PM  Result Value Ref Range   Protein S Ag, Total 91 60 - 150 %  Lupus anticoagulant panel   Collection Time: 02/27/24  4:45 PM  Result Value Ref Range   PTT Lupus Anticoagulant 38.3 0.0 - 43.5 sec   DRVVT 32.4 0.0 - 47.0 sec   Lupus Anticoag Interp Comment:   Beta-2-glycoprotein i abs, IgG/M/A   Collection Time: 02/27/24  4:45 PM  Result Value Ref Range   Beta-2 Glyco I IgG <9 0 - 20 GPI IgG units   Beta-2-Glycoprotein I IgM <9 0 - 32 GPI IgM units   Beta-2-Glycoprotein I IgA <9 0 - 25 GPI IgA units  Homocysteine, serum   Collection Time: 02/27/24  4:45 PM  Result Value Ref Range   Homocysteine 13.2 0.0 - 14.5 umol/L  Factor 5 leiden   Collection Time: 02/27/24  4:45 PM  Result Value Ref Range   Recommendations-F5LEID: Comment    Reviewed By: Comment   Prothrombin gene mutation   Collection Time: 02/27/24  4:45 PM  Result Value Ref Range   Recommendations-PTGENE: Comment    Reviewed by: Comment   Cardiolipin antibodies, IgG, IgM, IgA    Collection Time: 02/27/24  4:45 PM  Result Value Ref Range   Anticardiolipin IgG <9 0 - 14 GPL U/mL   Anticardiolipin IgM <9 0 - 12 MPL U/mL   Anticardiolipin IgA <9 0 - 11 APL U/mL  Glucose, capillary   Collection Time: 02/28/24  5:14 AM  Result Value Ref Range   Glucose-Capillary 85 70 - 99 mg/dL      Assessment & Plan:   Problem List Items Addressed This Visit     Headache   Relevant Medications   NURTEC 75 MG TBDP   Other Visit Diagnoses       Vasovagal episode    -  Primary     Post concussion syndrome            Syncope with fall / facial injury contusion, lip laceration HFU Likely vagal response due to vomiting and gastrointestinal symptoms. Extensive testing performed, currently has ZIO Patch monitor in place. Upcoming Cardiology apt after review results medications adjusted to prevent hypotension after discharge - Discontinue amlodipine, metoprolol, and minoxidil. (Note these were all for non BP related issues with RLS / Tremors / Hair Loss) - Follow up with cardiology after Zio patch results.  Note lip laceration has healed. No sutures in place. 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 Tylenol used for relief; hydrocodone caused nausea. Nurtec ODT discussed for headache relief. Sample given Nurtec ODT, one tablet every 24-48 hours as needed, 2 tabs. Trial this to see if effective - Advise  rest and avoidance of physical exertion for one week. - Monitor symptoms; consider repeat imaging if headaches worsen.  Restless leg syndrome Amlodipine discontinued without change in symptoms. Neurology suggested Lyrica or gabapentin. - Consider Lyrica or gabapentin if symptoms persist.  Essential tremor Metoprolol discontinued. Weighted bracelets suggested and reported helpful. - Continue using  weighted bracelets for tremor management.        No orders of the defined types were placed in this encounter.   Meds ordered this encounter  Medications   NURTEC 75 MG TBDP    Sig: Take 1 tablet (75 mg total) by mouth daily as needed (migraine headache). Max 1 tablet in 24 hours.    Follow up plan: Return if symptoms worsen or fail to improve.    Saralyn Pilar, DO [facility] 03/08/2024, 9:45 AM

## 2024-03-22 NOTE — Addendum Note (Signed)
 Encounter addended by: Dudley Ghee, RN on: 03/22/2024 7:30 AM  Actions taken: Imaging Exam ended

## 2024-03-27 DIAGNOSIS — R55 Syncope and collapse: Secondary | ICD-10-CM | POA: Diagnosis not present

## 2024-03-28 ENCOUNTER — Telehealth: Payer: Self-pay

## 2024-03-28 DIAGNOSIS — I8393 Asymptomatic varicose veins of bilateral lower extremities: Secondary | ICD-10-CM

## 2024-03-28 NOTE — Telephone Encounter (Signed)
 Referral submitted to [facility] Dr Vonna Guardian  Domingo Friend, DO [facility] 03/28/2024, 5:33 PM

## 2024-03-28 NOTE — Addendum Note (Signed)
 Addended by: Raina Bunting on: 03/28/2024 05:33 PM   Modules accepted: Orders

## 2024-03-28 NOTE — Telephone Encounter (Signed)
 Copied from CRM [id number]. Topic: Referral - Request for Referral >> Mar 28, 2024  2:41 PM [place] E wrote: Did the patient discuss referral with their provider in the last year? Yes (If No - schedule appointment) (If Yes - send message)  Appointment offered? No  Type of order/referral and detailed reason for visit: Patient wanting a referral for her varicose veins.  Preference of office, provider, location: Dr. Mikki Alexander. [facility]  If referral order, have you been seen by this specialty before? No (If Yes, this issue or another issue? When? Where?  Can we respond through MyChart? Yes

## 2024-03-28 NOTE — Addendum Note (Signed)
 Addended by: Raina Bunting on: 03/28/2024 05:34 PM   Modules accepted: Orders

## 2024-04-07 ENCOUNTER — Other Ambulatory Visit: Payer: Self-pay | Admitting: Cardiology

## 2024-04-24 ENCOUNTER — Encounter (INDEPENDENT_AMBULATORY_CARE_PROVIDER_SITE_OTHER): Payer: Self-pay

## 2024-05-01 ENCOUNTER — Ambulatory Visit (INDEPENDENT_AMBULATORY_CARE_PROVIDER_SITE_OTHER): Admitting: Vascular Surgery

## 2024-05-01 VITALS — BP 105/69 | HR 79 | Resp 18 | Ht 64.0 in | Wt 148.4 lb

## 2024-05-01 DIAGNOSIS — M328 Other forms of systemic lupus erythematosus: Secondary | ICD-10-CM | POA: Diagnosis not present

## 2024-05-01 DIAGNOSIS — I83893 Varicose veins of bilateral lower extremities with other complications: Secondary | ICD-10-CM | POA: Insufficient documentation

## 2024-05-01 DIAGNOSIS — E782 Mixed hyperlipidemia: Secondary | ICD-10-CM

## 2024-05-01 NOTE — Progress Notes (Signed)
 Patient ID: Christina Meza, female   DOB: [birth year], 58 y.o.   MRN: [id number]  Chief Complaint  Patient presents with   Establish Care    per JD - varicose veins. see JD    HPI Christina Meza is a 58 y.o. female.  I am asked to see the patient by Dr. Romeo Co for evaluation of varicose veins of the lower extremity creating pain and swelling.  The patient presents with complaints of symptomatic varicosities of the legs. The patient reports a long standing history of varicosities and they have become painful over time. There was no clear inciting event or causative factor that started the symptoms.  The left leg is more severly affected. The patient elevates the legs for relief. The pain is described as itching, stinging, and burning overlying the varicosities as well as heaviness and tiredness in the leg. The symptoms are generally most severe in the evening, particularly when they have been on their feet for long periods of time.  Compression socks as well as elevation has been used to try to improve the symptoms with some success. The patient complains of worsening left leg swelling as an associated symptom. The patient has no previous history of deep venous thrombosis or superficial thrombophlebitis to their knowledge.  She does notice that when she exercises more the swelling is better and the pain is better.  She is very fit and active.     Past Medical History:  Diagnosis Date   Allergy    Arthritis    Asthma    Clostridium difficile infection    2015x2 , 2016x1   Depression    Diastolic dysfunction    a. TTE 1/18: EF 50-55%, no RWMA, Gr1DD, mildly dilated aortic root of 3.4 cm, mildly dilated ascending aorta of 3.7 cm, mild mitral regurgitation, normal size left atrium, normal RV systolic function, PASP normal   Genital warts    GERD (gastroesophageal reflux disease)    Inappropriate sinus tachycardia (HCC)    a. 2010 Nuc Stress test: no ischemia/infarct;  b. 10/2013  Echo: Nl LV fxn, mild MR/TR.   Migraine    Palpitations    Prolonged QT interval    SLE (systemic lupus erythematosus) (HCC)    Stomach ulcer     Past Surgical History:  Procedure Laterality Date   BREAST BIOPSY Right 03/01/2017   u/s core bx neg ADENOSIS AND FOCAL MICROCYST FORMATION.   BREAST CYST ASPIRATION Left 4-5 yrs ago   NEG   CATARACT EXTRACTION  2008   ENDOSCOPIC VEIN LASER TREATMENT     ESOPHAGOGASTRODUODENOSCOPY N/A 07/31/2021   Procedure: ESOPHAGOGASTRODUODENOSCOPY (EGD);  Surgeon: Toledo, Alphonsus Jeans, MD;  Location: [facility] ENDOSCOPY;  Service: Gastroenterology;  Laterality: N/A;   UMBILICAL HERNIA REPAIR     vulvar excision for HPV  07/04/2014    Family History  Problem Relation Age of Onset   Arthritis Mother    Bipolar disorder Mother    Depression Mother    Mental illness Mother    Hypertension Maternal Grandmother    Hyperlipidemia Maternal Grandfather    Heart disease Maternal Grandfather    Stroke Maternal Grandfather    Hypertension Maternal Grandfather    Colon cancer Paternal Grandfather    Breast cancer Neg Hx      Social History   Tobacco Use   Smoking status: Never   Smokeless tobacco: Never  Vaping Use   Vaping status: Never Used  Substance Use Topics   Alcohol use: Yes  Alcohol/week: 0.0 - 1.0 standard drinks of alcohol   Drug use: No    Allergies  Allergen Reactions   Lansoprazole Swelling and Hives    Lips and tongue   Celebrex [Celecoxib] Other (See Comments) and Itching    Recommended not taking due to a stomach issue.    Sulfa Antibiotics Itching and Hives   Sulfamethoxazole-Trimethoprim Itching and Hives   Furosemide Itching and Hives    Current Outpatient Medications  Medication Sig Dispense Refill   albuterol  (VENTOLIN  HFA) 108 (90 Base) MCG/ACT inhaler TAKE 2 PUFFS BY MOUTH EVERY 6 HOURS AS NEEDED FOR WHEEZE OR SHORTNESS OF BREATH 8.5 each 2   Belimumab 200 MG/ML SOAJ Inject 200 mg into the skin.     Biotin 5000 MCG  TABS Take 5,000 mcg by mouth every morning.     cholecalciferol  (VITAMIN D3) 25 MCG (1000 UNIT) tablet Take 1,000 Units by mouth daily.     clonazePAM  (KLONOPIN ) 0.5 MG tablet Take 0.5-1 tablets (0.25-0.5 mg total) by mouth 2 (two) times daily as needed for anxiety. 30 tablet 2   conjugated estrogens (PREMARIN) vaginal cream Apply 0.5 g topically.     diclofenac  sodium (VOLTAREN ) 1 % GEL Apply 1 application topically as needed.     DULoxetine  (CYMBALTA ) 60 MG capsule Take 1 capsule (60 mg total) by mouth daily. 90 capsule 3   estradiol (CLIMARA - DOSED IN MG/24 HR) 0.025 mg/24hr patch APPLY 1 PATCH ON THE SKIN  ONCE A WEEK     folic acid  (FOLVITE ) 1 MG tablet Take 1 mg by mouth daily.     hydroxychloroquine  (PLAQUENIL ) 200 MG tablet Take 200 mg by mouth 2 (two) times daily. Alternating 200 mg once a day and next day twice a day.     ipratropium (ATROVENT ) 0.06 % nasal spray PLACE 2 SPRAYS INTO BOTH NOSTRILS 4 (FOUR) TIMES DAILY. FOR UP TO 5-7 DAYS THEN STOP. 72 mL 0   Multiple Vitamins-Minerals (MULTIVITAMIN WITH MINERALS) tablet Take 1 tablet by mouth every morning.     naproxen  sodium (ALEVE ) 220 MG tablet Take 1 tablet (220 mg total) by mouth at bedtime.     NURTEC 75 MG TBDP Take 1 tablet (75 mg total) by mouth daily as needed (migraine headache). Max 1 tablet in 24 hours.     pregabalin  (LYRICA ) 25 MG capsule Take 1 capsule (25 mg total) by mouth 2 (two) times daily. 180 capsule 3   Probiotic Product (PROBIOTIC DAILY PO) Take 1 tablet by mouth daily.      progesterone  (PROMETRIUM ) 100 MG capsule Take 100 mg by mouth at bedtime.     rOPINIRole  (REQUIP ) 1 MG tablet Take 1 tablet (1 mg total) by mouth at bedtime. 90 tablet 3   valACYclovir  (VALTREX ) 500 MG tablet Take 500 mg by mouth at bedtime.     HYDROcodone -acetaminophen  (NORCO/VICODIN) 5-325 MG tablet Take 1 tablet by mouth every 12 (twelve) hours as needed for moderate pain (pain score 4-6) or severe pain (pain score 7-10). (Patient not  taking: Reported on 05/01/2024) 10 tablet 0   No current facility-administered medications for this visit.      REVIEW OF SYSTEMS (Negative unless checked)  Constitutional: [] Weight loss  [] Fever  [] Chills Cardiac: [] Chest pain   [] Chest pressure   [] Palpitations   [] Shortness of breath when laying flat   [] Shortness of breath at rest   [] Shortness of breath with exertion. Vascular:  [] Pain in legs with walking   [] Pain in legs at rest   []   Pain in legs when laying flat   [] Claudication   [] Pain in feet when walking  [] Pain in feet at rest  [] Pain in feet when laying flat   [] History of DVT   [] Phlebitis   [] Swelling in legs   [x] Varicose veins   [] Non-healing ulcers Pulmonary:   [] Uses home oxygen   [] Productive cough   [] Hemoptysis   [] Wheeze  [] COPD   [x] Asthma Neurologic:  [] Dizziness  [] Blackouts   [] Seizures   [] History of stroke   [] History of TIA  [] Aphasia   [] Temporary blindness   [] Dysphagia   [] Weakness or numbness in arms   [] Weakness or numbness in legs Musculoskeletal:  [x] Arthritis   [] Joint swelling   [] Joint pain   [] Low back pain Hematologic:  [] Easy bruising  [] Easy bleeding   [] Hypercoagulable state   [] Anemic  [] Hepatitis Gastrointestinal:  [] Blood in stool   [] Vomiting blood  [x] Gastroesophageal reflux/heartburn   [] Abdominal pain Genitourinary:  [] Chronic kidney disease   [] Difficult urination  [] Frequent urination  [] Burning with urination   [] Hematuria Skin:  [] Rashes   [] Ulcers   [] Wounds Psychological:  [] History of anxiety   []  History of major depression.    Physical Exam BP 105/69 (BP Location: Right Arm, Patient Position: Sitting, Cuff Size: Normal)   Pulse 79   Resp 18   Ht 5\' 4"  (1.626 m)   Wt 148 lb 6.4 oz (67.3 kg)   LMP 07/15/2015 (Within Days)   BMI 25.47 kg/m  Gen:  WD/WN, NAD. Appears younger than stated age. Head: [place]/AT, No temporalis wasting.  Ear/Nose/Throat: Hearing grossly intact, dentition good Eyes: Sclera non-icteric. Conjunctiva  clear Neck: Supple. Trachea midline Pulmonary:  Good air movement, no use of accessory muscles, respirations not labored.  Cardiac: RRR, No JVD Vascular: Varicosities scattered and measuring up to 1 mm in the right lower extremity        Varicosities quite diffuse and somewhat extensive and measuring up to 3 mm in the left lower extremity Vessel Right Left  Radial Palpable Palpable                          PT Palpable Palpable  DP Palpable Palpable   Gastrointestinal: soft, non-tender/non-distended.  Musculoskeletal: M/S 5/5 throughout.   No RLE edema.  Trace LLE edema Neurologic: Sensation grossly intact in extremities.  Symmetrical.  Speech is fluent.  Psychiatric: Judgment intact, Mood & affect appropriate for pt's clinical situation. Dermatologic: No rashes or ulcers noted.  No cellulitis or open wounds.    Radiology No results found.  Labs Recent Results (from the past 2160 hours)  Iron, TIBC and Ferritin Panel     Status: None   Collection Time: 02/14/24  3:12 PM  Result Value Ref Range   Total Iron Binding Capacity 266 250 - 450 ug/dL   UIBC 086 578 - 469 ug/dL   Iron 68 27 - 629 ug/dL   Iron Saturation 26 15 - 55 %   Ferritin 140 15 - 150 ng/mL  CBG monitoring, ED     Status: Abnormal   Collection Time: 02/26/24  1:37 PM  Result Value Ref Range   Glucose-Capillary 68 (L) 70 - 99 mg/dL    Comment: Glucose reference range applies only to samples taken after fasting for at least 8 hours.  Protime-INR     Status: None   Collection Time: 02/26/24  2:13 PM  Result Value Ref Range   Prothrombin Time 12.8 11.4 - 15.2 seconds  INR 0.9 0.8 - 1.2    Comment: (NOTE) INR goal varies based on device and disease states. Performed at [facility], [address] [place], [place] [address]   APTT     Status: None   Collection Time: 02/26/24  2:13 PM  Result Value Ref Range   aPTT 28 24 - 36 seconds    Comment: Performed at [facility], [address] [place], [place] [address]  CBC     Status: Abnormal   Collection Time: 02/26/24  2:13 PM  Result Value Ref Range   WBC 4.4 4.0 - 10.5 K/uL   RBC 3.62 (L) 3.87 - 5.11 MIL/uL   Hemoglobin 11.7 (L) 12.0 - 15.0 g/dL   HCT 13.0 (L) 86.5 - 78.4 %   MCV 97.2 80.0 - 100.0 fL   MCH 32.3 26.0 - 34.0 pg   MCHC 33.2 30.0 - 36.0 g/dL   RDW 69.6 29.5 - 28.4 %   Platelets 161 150 - 400 K/uL   nRBC 0.0 0.0 - 0.2 %    Comment: Performed at [facility], [address] [place], [place] [address]  Differential     Status: Abnormal   Collection Time: 02/26/24  2:13 PM  Result Value Ref Range   Neutrophils Relative % 77 %   Neutro Abs 3.4 1.7 - 7.7 K/uL   Lymphocytes Relative 10 %   Lymphs Abs 0.4 (L) 0.7 - 4.0 K/uL   Monocytes Relative 12 %   Monocytes Absolute 0.6 0.1 - 1.0 K/uL   Eosinophils Relative 1 %   Eosinophils Absolute 0.0 0.0 - 0.5 K/uL   Basophils Relative 0 %   Basophils Absolute 0.0 0.0 - 0.1 K/uL   Immature Granulocytes 0 %   Abs Immature Granulocytes 0.01 0.00 - 0.07 K/uL    Comment: Performed at [facility], [address] [place], [place] [address]  Comprehensive metabolic panel     Status: Abnormal   Collection Time: 02/26/24  2:13 PM  Result Value Ref Range   Sodium 133 (L) 135 - 145 mmol/L   Potassium 4.1 3.5 - 5.1 mmol/L   Chloride 105 98 - 111 mmol/L   CO2 25 22 - 32 mmol/L   Glucose, Bld 105 (H) 70 - 99 mg/dL    Comment: Glucose reference range applies only to samples taken after fasting for at least 8 hours.   BUN 23 (H) 6 - 20 mg/dL   Creatinine, Ser 2.53 0.44 - 1.00 mg/dL   Calcium  8.3 (L) 8.9 - 10.3 mg/dL   Total Protein 5.7 (L) 6.5 - 8.1 g/dL   Albumin 3.3 (L) 3.5 - 5.0 g/dL   AST 24 15 - 41 U/L   ALT 19 0 - 44 U/L   Alkaline Phosphatase 61 38 - 126 U/L   Total Bilirubin 0.6 0.0 - 1.2 mg/dL   GFR, Estimated >66 >44 mL/min    Comment: (NOTE) Calculated using the CKD-EPI Creatinine Equation (2021)    Anion gap 3 (L) 5 -  15    Comment: Performed at [facility], [address] [place], [place] [address]  Ethanol     Status: None   Collection Time: 02/26/24  2:13 PM  Result Value Ref Range   Alcohol, Ethyl (B) <10 <10 mg/dL    Comment: (NOTE) Lowest detectable limit for serum alcohol is 10 mg/dL.  For medical purposes only. Performed at [facility], [address] [place], [place] [address]  Troponin I (High Sensitivity)     Status: None   Collection Time: 02/26/24  4:08 PM  Result Value Ref Range   Troponin I (High Sensitivity) 14 <18 ng/L    Comment: (NOTE) Elevated high sensitivity troponin I (hsTnI) values and significant  changes across serial measurements may suggest ACS but many other  chronic and acute conditions are known to elevate hsTnI results.  Refer to the "Links" section for chest pain algorithms and additional  guidance. Performed at [facility], [address] [place], [place] [address]   Troponin I (High Sensitivity)     Status: None   Collection Time: 02/27/24  3:42 AM  Result Value Ref Range   Troponin I (High Sensitivity) 16 <18 ng/L    Comment: (NOTE) Elevated high sensitivity troponin I (hsTnI) values and significant  changes across serial measurements may suggest ACS but many other  chronic and acute conditions are known to elevate hsTnI results.  Refer to the "Links" section for chest pain algorithms and additional  guidance. Performed at [facility], [address] [place], [place] [address]   Basic metabolic panel     Status: Abnormal   Collection Time: 02/27/24  3:42 AM  Result Value Ref Range   Sodium 136 135 - 145 mmol/L   Potassium 4.2 3.5 - 5.1 mmol/L   Chloride 106 98 - 111 mmol/L   CO2 26 22 - 32 mmol/L   Glucose, Bld 91 70 - 99 mg/dL    Comment: Glucose reference range applies only to samples taken after fasting for at least 8 hours.   BUN 15 6 - 20 mg/dL   Creatinine, Ser 9.62 0.44 - 1.00 mg/dL    Calcium  8.3 (L) 8.9 - 10.3 mg/dL   GFR, Estimated >95 >28 mL/min    Comment: (NOTE) Calculated using the CKD-EPI Creatinine Equation (2021)    Anion gap 4 (L) 5 - 15    Comment: Performed at [facility], [address] [place], [place] [address]  CBC     Status: Abnormal   Collection Time: 02/27/24  3:42 AM  Result Value Ref Range   WBC 2.5 (L) 4.0 - 10.5 K/uL   RBC 3.58 (L) 3.87 - 5.11 MIL/uL   Hemoglobin 11.8 (L) 12.0 - 15.0 g/dL   HCT 40.1 (L) 02.7 - 25.3 %   MCV 98.9 80.0 - 100.0 fL   MCH 33.0 26.0 - 34.0 pg   MCHC 33.3 30.0 - 36.0 g/dL   RDW 66.4 40.3 - 47.4 %   Platelets 173 150 - 400 K/uL   nRBC 0.0 0.0 - 0.2 %    Comment: Performed at [facility], [address] [place], [place] [address]  ECHOCARDIOGRAM COMPLETE     Status: None   Collection Time: 02/27/24  2:22 PM  Result Value Ref Range   Weight 2,255.75 oz   Height 64 in   BP 103/76 mmHg   Ao pk vel 1.13 m/s   AV Area VTI 2.23 cm2   AR max vel 2.32 cm2   AV Mean grad 2.0 mmHg   AV Peak grad 5.1 mmHg   Single Plane A4C EF 54.2 %   S' Lateral 3.30 cm   AV Area mean vel 2.12 cm2   Area-P 1/2 5.54 cm2   MV VTI 2.21 cm2   Est EF 55 - 60%   Antithrombin III      Status: None   Collection Time: 02/27/24  4:45 PM  Result  Value Ref Range   AntiThromb III Func 112 75 - 120 %    Comment: Performed at [facility], [address], [place], [place] [address]  Protein C activity     Status: None   Collection Time: 02/27/24  4:45 PM  Result Value Ref Range   Protein C Activity 120 73 - 180 %    Comment: (NOTE) Performed At: [facility] [address] [place], [place] [id number] Pearlean Botts MD QQ:[id number]   Protein C, total     Status: None   Collection Time: 02/27/24  4:45 PM  Result Value Ref Range   Protein C, Total 91 60 - 150 %    Comment: (NOTE) Performed At: [facility] [address] [place], [place] [id number] Pearlean Botts MD [id number]    Protein S activity     Status: None   Collection Time: 02/27/24  4:45 PM  Result Value Ref Range   Protein S Activity 81 63 - 140 %    Comment: (NOTE) Protein S activity may be falsely increased (masking an abnormal, low result) in patients receiving direct Xa inhibitor (e.g., rivaroxaban, apixaban, edoxaban) or a direct thrombin inhibitor (e.g., dabigatran) anticoagulant treatment due to assay interference by these drugs. Performed At: [facility] [address] [place], [place] [id number] Pearlean Botts MD KG:[id number]   Protein S, total     Status: None   Collection Time: 02/27/24  4:45 PM  Result Value Ref Range   Protein S Ag, Total 91 60 - 150 %    Comment: (NOTE) This test was developed and its performance characteristics determined by [facility]. It has not been cleared or approved by the Food and Drug Administration. Performed At: [facility] [address] [place], [place] [id number] Pearlean Botts MD [id number]   Lupus anticoagulant panel     Status: None   Collection Time: 02/27/24  4:45 PM  Result Value Ref Range   PTT Lupus Anticoagulant 38.3 0.0 - 43.5 sec   DRVVT 32.4 0.0 - 47.0 sec   Lupus Anticoag Interp Comment:     Comment: (NOTE) No lupus anticoagulant was detected. Performed At: [facility] [address] [place], [place] [id number] Pearlean Botts MD [id number]   Beta-2 -glycoprotein i abs, IgG/M/A     Status: None   Collection Time: 02/27/24  4:45 PM  Result Value Ref Range   Beta-2  Glyco I IgG <9 0 - 20 GPI IgG units    Comment: (NOTE) The reference interval reflects a 3SD or 99th percentile interval, which is thought to represent a potentially clinically significant result in accordance with the International Consensus Statement on the classification criteria for definitive antiphospholipid syndrome (APS). J Thromb Haem 2006;4:295-306.    Beta-2 -Glycoprotein I IgM <9 0 - 32 GPI IgM units    Comment: (NOTE) The  reference interval reflects a 3SD or 99th percentile interval, which is thought to represent a potentially clinically significant result in accordance with the International Consensus Statement on the classification criteria for definitive antiphospholipid syndrome (APS). J Thromb Haem 2006;4:295-306. Performed At: [facility] [address] [place], [place] [id number] Pearlean Botts MD [id number]    Beta-2 -Glycoprotein I IgA <9 0 - 25 GPI IgA units    Comment: (NOTE) The reference interval reflects a 3SD or 99th percentile interval, which is thought to represent a potentially clinically significant result in accordance with the International Consensus Statement on the classification criteria for definitive antiphospholipid syndrome (APS). J Thromb Haem 2006;4:295-306.  Homocysteine, serum     Status: None   Collection Time: 02/27/24  4:45 PM  Result Value Ref Range   Homocysteine 13.2 0.0 - 14.5 umol/L    Comment: (NOTE) Performed At: [facility] [address] [place], [place] [id number] Pearlean Botts MD ON:[id number]   Factor 5 leiden     Status: None   Collection Time: 02/27/24  4:45 PM  Result Value Ref Range   Recommendations-F5LEID: Comment     Comment: (NOTE) Result: c.1601G>A (p.Arg534Gln) - Not Detected This result is not associated with an increased risk for venous thromboembolism. See Additional Clinical Information and Comments. Additional Clinical Information: Venous thromboembolism is a multifactorial disease influenced by genetic, environmental, and circumstantial risk factors. The c.1601G>A (p. Arg534Gln) variant in the F5 gene, commonly referred to as Factor V Leiden, is a genetic risk factor for venous thromboembolism. Heterozygous carriers of this variant have a 6- to 8- fold increased risk for venous thromboembolism. Individuals homozygous for this variant (ie, with a copy of the variant on each chromosome) have an approximately  80-fold increased risk for venous thromboembolism. Individuals who carry both a c.*97G>A variant in the F2 gene and Factor V Leiden have an approximately 20-fold increased risk for venous thromboembolism. Risks are likely to be even higher in more complex genotype combinations in volving the F2 c.*97G>A variant and Factor V Leiden (PMID: 44010272). Additional risk factors include but are not limited to: deficiency of protein C, protein S, or antithrombin III , age, female sex, personal or family history of deep vein thromboembolism, smoking, surgery, prolonged immobilization, malignant neoplasm, tamoxifen treatment, raloxifene treatment, oral contraceptive use, hormone replacement therapy, and pregnancy. Management of thrombotic risk and thrombotic events should follow established guidelines and fit the clinical circumstance. This result cannot predict the occurrence or recurrence of a thrombotic event. Comment: Genetic counseling is recommended to discuss the potential clinical implications of positive results, as well as recommendations for testing family members. Genetic Coordinators are available for health care providers to discuss results at [phone]). Test Details: Variant Analyzed: c.1601G>A (p. Arg534Gln), referred to as Fact or V Leiden Methods/Limitations: DNA analysis of the F5 gene (NM_000130.5) was performed by PCR amplification followed by restriction enzyme analysis. The diagnostic sensitivity is >99%. Results must be combined with clinical information for the most accurate interpretation. Molecular- based testing is highly accurate, but as in any laboratory test, diagnostic errors may occur. False positive or false negative results may occur for reasons that include genetic variants, blood transfusions, bone marrow transplantation, somatic or tissue-specific mosaicism, mislabeled samples, or erroneous representation of family relationships. This test was  developed and its performance characteristics determined by [facility]. It has not been cleared or approved by the Food and Drug Administration. References: Bhatt S, Taylor AK, Lozano R, [facility], [facility]; ACMG Professional Practice and Guidelines Committee. Addendum: [employer] Gen[employer] consensus statement on fac tor V Leiden mutation testing. Genet Med. 2021 Mar 5. doi: 44.0347/Q25956-387- 01108-x. PMID: 56433295. Nanetta Baars. Factor V Leiden Thrombophilia. 1999 May 14 (Updated 2018 Jan 4). In: Jerri Morale, Ardinger HH, Pagon RA, et al., editors. GeneReviews(R) (Internet). [address] [facility]): [employer] , [place]; 1884[address]1660. Available from: [email or web address]-mcgee.org/ Lynda Sands, Huang X, Luo B, Spector EB, Nicolasa Barrett CS; ACMG Laboratory Quality Assurance Committee. Venous thromboembolism laboratory testing (factor V Leiden and factor II c.*97G>A), 2018 update: a technical standard of the [employer] of [employer] and [employer] ([facility]). Genet Med. 2018 Dec;20(12):1489-1498. doi: 10.1038/s41436-316-520-0308-z. Epub 2018 Oct  5. PMID: 16109604.    Reviewed By: Comment     Comment: (NOTE) Technical Component performed at [employer] [facility] Professional Component performed by: [employer] [facility] [employer] Melodie Spry, Ph.D., [facility] Director, Molecular Genetics [address] [place] [address] Performed At: [facility] [address] [place], [place] [id number] Adams Adams MDPhD FA:[id number]   Prothrombin gene mutation     Status: None   Collection Time: 02/27/24  4:45 PM  Result Value Ref Range   Recommendations-PTGENE: Comment     Comment: (NOTE) Result: c.*97G>A - Not Detected This result is not associated with an increased risk for venous thromboembolism. See Additional Clinical Information and Comments. Additional Clinical Information: Venous thromboembolism is a multifactorial disease influenced  by genetic, environmental, and circumstantial risk factors. The c.*97G>A variant in the F2 gene is a genetic risk factor for venous thromboembolism. Heterozygous carriers have a 2- to 4-fold increased risk for venous thromboembolism. Homozygotes for the c.*97G>A variant are rare. The annual risk of VTE in homozygotes has been reported to be 1.1%/year. Individuals who carry both a c.*97G>A variant in the F2 gene and a c.1601G>A (p. Arg534Gln) variant in the F5 gene (commonly referred to as Factor V Leiden) have an approximately 20- fold increased risk for venous thromboembolism. Risks are likely to be even higher in more complex genotype combinations involving the F2 c.*97G>A variant and Factor V Leiden (PMID:  [id number]). Additional risk factors include but are not limited to: deficiency of protein C, protein S, or antithrombin III , age, female sex, personal or family history of deep vein thromboembolism, smoking, surgery, prolonged immobilization, malignant neoplasm, tamoxifen treatment, raloxifene treatment, oral contraceptive use, hormone replacement therapy, and pregnancy. Management of thrombotic risk and thrombotic events should follow established guidelines and fit the clinical circumstance. This result cannot predict the occurrence or recurrence of a thrombotic event. Comments: Genetic counseling is recommended to discuss the potential clinical implications of positive results, as well as recommendations for testing family members. Genetic Coordinators are available for health care providers to discuss results at [phone]). 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 The diagnostic sensitivity is >99%. Results must be combined with clinical information for the most accurate interpretation.  Molecular-based testing is highly accurate, but as in any laboratory test, diagnostic errors may occur. False positive or false negative results may occur for reasons that include genetic variants, blood transfusions, bone marrow transplantation, somatic or tissue-specific mosaicism, mislabeled samples, or erroneous representation of family relationships. This test was developed and its performance characteristics determined by [facility]. It has not been cleared or approved by the Food and Drug Administration. References: Bhatt S, Taylor AK, Lozano R, Grody [facility], Man[facility]; [facility] Professional Practice and Guidelines Committee. Addendum: [employer] consensus statement on factor V Leiden mutation testing. Genet Med. 2021 Mar 5. doi: 44.0102/V253 [phone]-x. PMID: 66440347. Nanetta Baars. Prothrombin Thrombophilia. 2006 Jul 25 [Updated 2021 Feb 4]. In: Jerri Morale, Ardinger HH, Pagon RA, et al., editors. GeneReviews(R) [Internet]. [address] [facility]): [employer] , [place]; 4259-5638. Available from: [email or web address].[email or web address]/ Lynda Sands, Huang X, Luo B, Spector EB, Nicolasa Barrett CS; [facility] Laboratory Quality Assurance Committee. Venous thromboembolism laboratory testing (factor V Leiden and factor II c.*97G>A), 2018 update: a technical standard of the [employer] of [employer] ([facility]). Genet Med. 2018 Dec;20(12):1489-1498. doi: 10.1038/s41436-218 177 8996-z. Epub 2018 Oct  5. PMID: [id number].    Reviewed by: Comment     Comment: (NOTE) Technical Component performed at [facility] Professional Component performed by: [employer], Ph.D., [facility] Director, Molecular Genetics [address] [place] [address] Performed At: [employer] [facility] [address] [place], [place] [id number] Adams Adams MDPhD [id number]   Cardiolipin antibodies, IgG, IgM, IgA     Status: None    Collection Time: 02/27/24  4:45 PM  Result Value Ref Range   Anticardiolipin IgG <9 0 - 14 GPL U/mL    Comment: (NOTE)                          Negative:              <15                          Indeterminate:     15 - 20                          Low-Med Positive: >20 - 80                          High Positive:         >80    Anticardiolipin IgM <9 0 - 12 MPL U/mL    Comment: (NOTE)                          Negative:              <13                          Indeterminate:     13 - 20                          Low-Med Positive: >20 - 80                          High Positive:         >80    Anticardiolipin IgA <9 0 - 11 APL U/mL    Comment: (NOTE)                          Negative:              <12                          Indeterminate:     12 - 20                          Low-Med Positive: >20 - 80                          High Positive:         >80 Performed At: [facility] [address] [place], [place] [id number] Pearlean Botts MD AY:[id number]   Glucose, capillary     Status: None   Collection Time: 02/28/24  5:14 AM  Result Value Ref Range   Glucose-Capillary 85 70 - 99 mg/dL  Comment: Glucose reference range applies only to samples taken after fasting for at least 8 hours.    Assessment/Plan:  SLE (systemic lupus erythematosus) (HCC) Sees rheumatology, medical regimen as above  Hyperlipidemia lipid control important in reducing the progression of atherosclerotic disease.    Varicose veins of bilateral lower extremities with other complications   The patient has symptoms consistent with chronic venous insufficiency. We discussed the natural history and treatment options for venous disease. I recommended the regular use of 20 - 30 mm Hg compression stockings, and prescribed these today. I recommended leg elevation and anti-inflammatories as needed for pain. I have also recommended a complete venous duplex to assess the venous system for reflux or  thrombotic issues. This can be done at the patient's convenience. I will see the patient back after the duplex to assess the response to conservative management, and determine further treatment options.     Mikki Alexander 05/01/2024, 3:40 PM   This note was created with Dragon medical transcription system.  Any errors from dictation are unintentional.

## 2024-05-01 NOTE — Assessment & Plan Note (Signed)
 Sees rheumatology, medical regimen as above

## 2024-05-01 NOTE — Assessment & Plan Note (Signed)
lipid control important in reducing the progression of atherosclerotic disease.   

## 2024-05-17 ENCOUNTER — Ambulatory Visit (INDEPENDENT_AMBULATORY_CARE_PROVIDER_SITE_OTHER)

## 2024-05-17 DIAGNOSIS — I83893 Varicose veins of bilateral lower extremities with other complications: Secondary | ICD-10-CM | POA: Diagnosis not present

## 2024-05-22 ENCOUNTER — Encounter (INDEPENDENT_AMBULATORY_CARE_PROVIDER_SITE_OTHER): Payer: Self-pay | Admitting: Vascular Surgery

## 2024-05-22 ENCOUNTER — Ambulatory Visit (INDEPENDENT_AMBULATORY_CARE_PROVIDER_SITE_OTHER): Admitting: Vascular Surgery

## 2024-05-22 VITALS — BP 107/69 | HR 84 | Resp 18 | Ht 65.0 in | Wt 147.0 lb

## 2024-05-22 DIAGNOSIS — I83893 Varicose veins of bilateral lower extremities with other complications: Secondary | ICD-10-CM | POA: Diagnosis not present

## 2024-05-22 NOTE — Assessment & Plan Note (Signed)
 She had a  venous reflux study was performed last week.  This demonstrates no evidence of deep venous thrombosis or superficial thrombophlebitis.  There was no significant venous reflux noted on the right leg.  On the left leg, the left great saphenous vein demonstrated reflux as did the left anterior accessory saphenous vein.  Given this finding, treatment of her symptomatic venous disease which has been refractory to compression socks, elevation, exercise, and anti-inflammatories would be reasonable.  She has CEAP class III venous insufficiency.  I have discussed the risks and benefits of laser ablation of the left great saphenous vein.  I have discussed the small risk of DVT, bleeding, infection, and recurrence.  I discussed that this would not eliminate the prominent vein branches or anterior accessory saphenous vein that would likely need to be treated with foam sclerotherapy after laser ablation.  Patient voices her understanding and desires to proceed.

## 2024-05-22 NOTE — Progress Notes (Signed)
 MRN : [id number]  Christina Meza is a 58 y.o. (Sep 08, 1966) female who presents with chief complaint of  Chief Complaint  Patient presents with   Follow-up    Follow up Bilateral reflux done on 05/17/24  .  History of Present Illness: Patient returns today in follow up of her venous disease.  She has had no major changes or issues since her last visit last month.  She has long been wearing compression socks and elevating her legs.  She remains very fit and active.  The left leg is the more severely affected of the 2 legs and the primary problem that caused her to seek treatment.  She had a  venous reflux study was performed last week.  This demonstrates no evidence of deep venous thrombosis or superficial thrombophlebitis.  There was no significant venous reflux noted on the right leg.  On the left leg, the left great saphenous vein demonstrated reflux as did the left anterior accessory saphenous vein.  Current Outpatient Medications  Medication Sig Dispense Refill   albuterol  (VENTOLIN  HFA) 108 (90 Base) MCG/ACT inhaler TAKE 2 PUFFS BY MOUTH EVERY 6 HOURS AS NEEDED FOR WHEEZE OR SHORTNESS OF BREATH 8.5 each 2   Belimumab 200 MG/ML SOAJ Inject 200 mg into the skin.     Biotin 5000 MCG TABS Take 5,000 mcg by mouth every morning.     cholecalciferol  (VITAMIN D3) 25 MCG (1000 UNIT) tablet Take 1,000 Units by mouth daily.     clonazePAM  (KLONOPIN ) 0.5 MG tablet Take 0.5-1 tablets (0.25-0.5 mg total) by mouth 2 (two) times daily as needed for anxiety. 30 tablet 2   conjugated estrogens (PREMARIN) vaginal cream Apply 0.5 g topically.     diclofenac  sodium (VOLTAREN ) 1 % GEL Apply 1 application topically as needed.     DULoxetine  (CYMBALTA ) 60 MG capsule Take 1 capsule (60 mg total) by mouth daily. 90 capsule 3   estradiol (CLIMARA - DOSED IN MG/24 HR) 0.025 mg/24hr patch APPLY 1 PATCH ON THE SKIN  ONCE A WEEK     folic acid  (FOLVITE ) 1 MG tablet Take 1 mg by mouth daily.      HYDROcodone -acetaminophen  (NORCO/VICODIN) 5-325 MG tablet Take 1 tablet by mouth every 12 (twelve) hours as needed for moderate pain (pain score 4-6) or severe pain (pain score 7-10). 10 tablet 0   hydroxychloroquine  (PLAQUENIL ) 200 MG tablet Take 200 mg by mouth 2 (two) times daily. Alternating 200 mg once a day and next day twice a day.     ipratropium (ATROVENT ) 0.06 % nasal spray PLACE 2 SPRAYS INTO BOTH NOSTRILS 4 (FOUR) TIMES DAILY. FOR UP TO 5-7 DAYS THEN STOP. 72 mL 0   Multiple Vitamins-Minerals (MULTIVITAMIN WITH MINERALS) tablet Take 1 tablet by mouth every morning.     naproxen  sodium (ALEVE ) 220 MG tablet Take 1 tablet (220 mg total) by mouth at bedtime.     NURTEC 75 MG TBDP Take 1 tablet (75 mg total) by mouth daily as needed (migraine headache). Max 1 tablet in 24 hours.     pregabalin  (LYRICA ) 25 MG capsule Take 1 capsule (25 mg total) by mouth 2 (two) times daily. 180 capsule 3   Probiotic Product (PROBIOTIC DAILY PO) Take 1 tablet by mouth daily.      progesterone  (PROMETRIUM ) 100 MG capsule Take 100 mg by mouth at bedtime.     rOPINIRole  (REQUIP ) 1 MG tablet Take 1 tablet (1 mg total) by mouth at bedtime. 90 tablet 3  valACYclovir  (VALTREX ) 500 MG tablet Take 500 mg by mouth at bedtime.     No current facility-administered medications for this visit.    Past Medical History:  Diagnosis Date   Allergy    Arthritis    Asthma    Clostridium difficile infection    2015x2 , 2016x1   Depression    Diastolic dysfunction    a. TTE 1/18: EF 50-55%, no RWMA, Gr1DD, mildly dilated aortic root of 3.4 cm, mildly dilated ascending aorta of 3.7 cm, mild mitral regurgitation, normal size left atrium, normal RV systolic function, PASP normal   Genital warts    GERD (gastroesophageal reflux disease)    Inappropriate sinus tachycardia (HCC)    a. 2010 Nuc Stress test: no ischemia/infarct;  b. 10/2013 Echo: Nl LV fxn, mild MR/TR.   Migraine    Palpitations    Prolonged QT interval     SLE (systemic lupus erythematosus) (HCC)    Stomach ulcer     Past Surgical History:  Procedure Laterality Date   BREAST BIOPSY Right 03/01/2017   u/s core bx neg ADENOSIS AND FOCAL MICROCYST FORMATION.   BREAST CYST ASPIRATION Left 4-5 yrs ago   NEG   CATARACT EXTRACTION  2008   ENDOSCOPIC VEIN LASER TREATMENT     ESOPHAGOGASTRODUODENOSCOPY N/A 07/31/2021   Procedure: ESOPHAGOGASTRODUODENOSCOPY (EGD);  Surgeon: Toledo, Alphonsus Jeans, MD;  Location: [facility] ENDOSCOPY;  Service: Gastroenterology;  Laterality: N/A;   UMBILICAL HERNIA REPAIR     vulvar excision for HPV  07/04/2014     Social History   Tobacco Use   Smoking status: Never   Smokeless tobacco: Never  Vaping Use   Vaping status: Never Used  Substance Use Topics   Alcohol use: Yes    Alcohol/week: 0.0 - 1.0 standard drinks of alcohol   Drug use: No       Family History  Problem Relation Age of Onset   Arthritis Mother    Bipolar disorder Mother    Depression Mother    Mental illness Mother    Hypertension Maternal Grandmother    Hyperlipidemia Maternal Grandfather    Heart disease Maternal Grandfather    Stroke Maternal Grandfather    Hypertension Maternal Grandfather    Colon cancer Paternal Grandfather    Breast cancer Neg Hx      Allergies  Allergen Reactions   Lansoprazole Swelling and Hives    Lips and tongue   Celebrex [Celecoxib] Other (See Comments) and Itching    Recommended not taking due to a stomach issue.    Sulfa Antibiotics Itching and Hives   Sulfamethoxazole-Trimethoprim Itching and Hives   Furosemide Itching and Hives     REVIEW OF SYSTEMS (Negative unless checked)  Constitutional: [] Weight loss  [] Fever  [] Chills Cardiac: [] Chest pain   [] Chest pressure   [] Palpitations   [] Shortness of breath when laying flat   [] Shortness of breath at rest   [] Shortness of breath with exertion. Vascular:  [] Pain in legs with walking   [] Pain in legs at rest   [] Pain in legs when laying flat    [] Claudication   [] Pain in feet when walking  [] Pain in feet at rest  [] Pain in feet when laying flat   [] History of DVT   [] Phlebitis   [x] Swelling in legs   [x] Varicose veins   [] Non-healing ulcers Pulmonary:   [] Uses home oxygen   [] Productive cough   [] Hemoptysis   [] Wheeze  [] COPD   [] Asthma Neurologic:  [] Dizziness  [] Blackouts   []   Seizures   [] History of stroke   [] History of TIA  [] Aphasia   [] Temporary blindness   [] Dysphagia   [] Weakness or numbness in arms   [] Weakness or numbness in legs Musculoskeletal:  [x] Arthritis   [] Joint swelling   [] Joint pain   [] Low back pain Hematologic:  [] Easy bruising  [] Easy bleeding   [] Hypercoagulable state   [] Anemic   Gastrointestinal:  [] Blood in stool   [] Vomiting blood  [x] Gastroesophageal reflux/heartburn   [] Abdominal pain Genitourinary:  [] Chronic kidney disease   [] Difficult urination  [] Frequent urination  [] Burning with urination   [] Hematuria Skin:  [] Rashes   [] Ulcers   [] Wounds Psychological:  [] History of anxiety   []  History of major depression.  Physical Examination  BP 107/69 (BP Location: Right Arm, Patient Position: Sitting, Cuff Size: Normal)   Pulse 84   Resp 18   Ht 5' 5 (1.651 m)   Wt 147 lb (66.7 kg)   LMP 07/15/2015 (Within Days)   BMI 24.46 kg/m  Gen:  WD/WN, NAD. Appears younger than stated age. Head: Eads/AT, No temporalis wasting. Ear/Nose/Throat: Hearing grossly intact, nares w/o erythema or drainage Eyes: Conjunctiva clear. Sclera non-icteric Neck: Supple.  Trachea midline Pulmonary:  Good air movement, no use of accessory muscles.  Cardiac: RRR, no JVD Vascular:  Vessel Right Left  Radial Palpable Palpable                          PT Palpable Palpable  DP Palpable Palpable   Gastrointestinal: soft, non-tender/non-distended. No guarding/reflex.  Musculoskeletal: M/S 5/5 throughout.  No deformity or atrophy.  Varicosities on the right lower extremity are scattered and no larger than 1 mm in diameter.   On the left lower extremity, her varicosities are quite diffuse and measure up to 3 mm in diameter. Trace LLE Edema. Neurologic: Sensation grossly intact in extremities.  Symmetrical.  Speech is fluent.  Psychiatric: Judgment intact, Mood & affect appropriate for pt's clinical situation. Dermatologic: No rashes or ulcers noted.  No cellulitis or open wounds.      Labs Recent Results (from the past 2160 hours)  CBG monitoring, ED     Status: Abnormal   Collection Time: 02/26/24  1:37 PM  Result Value Ref Range   Glucose-Capillary 68 (L) 70 - 99 mg/dL    Comment: Glucose reference range applies only to samples taken after fasting for at least 8 hours.  Protime-INR     Status: None   Collection Time: 02/26/24  2:13 PM  Result Value Ref Range   Prothrombin Time 12.8 11.4 - 15.2 seconds   INR 0.9 0.8 - 1.2    Comment: (NOTE) INR goal varies based on device and disease states. Performed at [facility], [address] [place], [place] [address]   APTT     Status: None   Collection Time: 02/26/24  2:13 PM  Result Value Ref Range   aPTT 28 24 - 36 seconds    Comment: Performed at [facility], [address] [place], [place] [address]  CBC     Status: Abnormal   Collection Time: 02/26/24  2:13 PM  Result Value Ref Range   WBC 4.4 4.0 - 10.5 K/uL   RBC 3.62 (L) 3.87 - 5.11 MIL/uL   Hemoglobin 11.7 (L) 12.0 - 15.0 g/dL   HCT 82.9 (L) 56.2 - 13.0 %   MCV 97.2 80.0 - 100.0 fL   MCH 32.3 26.0 - 34.0 pg  MCHC 33.2 30.0 - 36.0 g/dL   RDW 16.1 09.6 - 04.5 %   Platelets 161 150 - 400 K/uL   nRBC 0.0 0.0 - 0.2 %    Comment: Performed at [facility], [address] [place], [place] [address]  Differential     Status: Abnormal   Collection Time: 02/26/24  2:13 PM  Result Value Ref Range   Neutrophils Relative % 77 %   Neutro Abs 3.4 1.7 - 7.7 K/uL   Lymphocytes Relative 10 %   Lymphs Abs 0.4 (L) 0.7 - 4.0 K/uL   Monocytes Relative 12 %   Monocytes  Absolute 0.6 0.1 - 1.0 K/uL   Eosinophils Relative 1 %   Eosinophils Absolute 0.0 0.0 - 0.5 K/uL   Basophils Relative 0 %   Basophils Absolute 0.0 0.0 - 0.1 K/uL   Immature Granulocytes 0 %   Abs Immature Granulocytes 0.01 0.00 - 0.07 K/uL    Comment: Performed at [facility], [address] [place], [place] [address]  Comprehensive metabolic panel     Status: Abnormal   Collection Time: 02/26/24  2:13 PM  Result Value Ref Range   Sodium 133 (L) 135 - 145 mmol/L   Potassium 4.1 3.5 - 5.1 mmol/L   Chloride 105 98 - 111 mmol/L   CO2 25 22 - 32 mmol/L   Glucose, Bld 105 (H) 70 - 99 mg/dL    Comment: Glucose reference range applies only to samples taken after fasting for at least 8 hours.   BUN 23 (H) 6 - 20 mg/dL   Creatinine, Ser 8.29 0.44 - 1.00 mg/dL   Calcium  8.3 (L) 8.9 - 10.3 mg/dL   Total Protein 5.7 (L) 6.5 - 8.1 g/dL   Albumin 3.3 (L) 3.5 - 5.0 g/dL   AST 24 15 - 41 U/L   ALT 19 0 - 44 U/L   Alkaline Phosphatase 61 38 - 126 U/L   Total Bilirubin 0.6 0.0 - 1.2 mg/dL   GFR, Estimated >56 >21 mL/min    Comment: (NOTE) Calculated using the CKD-EPI Creatinine Equation (2021)    Anion gap 3 (L) 5 - 15    Comment: Performed at [facility], [address] [place], [place] [address]  Ethanol     Status: None   Collection Time: 02/26/24  2:13 PM  Result Value Ref Range   Alcohol, Ethyl (B) <10 <10 mg/dL    Comment: (NOTE) Lowest detectable limit for serum alcohol is 10 mg/dL.  For medical purposes only. Performed at [facility], [address] [place], [place] [address]   Troponin I (High Sensitivity)     Status: None   Collection Time: 02/26/24  4:08 PM  Result Value Ref Range   Troponin I (High Sensitivity) 14 <18 ng/L    Comment: (NOTE) Elevated high sensitivity troponin I (hsTnI) values and significant  changes across serial measurements may suggest ACS but many other  chronic and acute conditions are known to elevate hsTnI  results.  Refer to the Links section for chest pain algorithms and additional  guidance. Performed at [facility], [address] [place], [place] [address]   Troponin I (High Sensitivity)     Status: None   Collection Time: 02/27/24  3:42 AM  Result Value Ref Range   Troponin I (High Sensitivity) 16 <18 ng/L    Comment: (NOTE) Elevated high sensitivity troponin I (hsTnI) values and significant  changes across serial measurements may suggest ACS  but many other  chronic and acute conditions are known to elevate hsTnI results.  Refer to the Links section for chest pain algorithms and additional  guidance. Performed at [facility], [address] [place], [place] [address]   Basic metabolic panel     Status: Abnormal   Collection Time: 02/27/24  3:42 AM  Result Value Ref Range   Sodium 136 135 - 145 mmol/L   Potassium 4.2 3.5 - 5.1 mmol/L   Chloride 106 98 - 111 mmol/L   CO2 26 22 - 32 mmol/L   Glucose, Bld 91 70 - 99 mg/dL    Comment: Glucose reference range applies only to samples taken after fasting for at least 8 hours.   BUN 15 6 - 20 mg/dL   Creatinine, Ser 2.95 0.44 - 1.00 mg/dL   Calcium  8.3 (L) 8.9 - 10.3 mg/dL   GFR, Estimated >62 >13 mL/min    Comment: (NOTE) Calculated using the CKD-EPI Creatinine Equation (2021)    Anion gap 4 (L) 5 - 15    Comment: Performed at [facility], [address] [place], [place] [address]  CBC     Status: Abnormal   Collection Time: 02/27/24  3:42 AM  Result Value Ref Range   WBC 2.5 (L) 4.0 - 10.5 K/uL   RBC 3.58 (L) 3.87 - 5.11 MIL/uL   Hemoglobin 11.8 (L) 12.0 - 15.0 g/dL   HCT 84.6 (L) 96.2 - 95.2 %   MCV 98.9 80.0 - 100.0 fL   MCH 33.0 26.0 - 34.0 pg   MCHC 33.3 30.0 - 36.0 g/dL   RDW 84.1 32.4 - 40.1 %   Platelets 173 150 - 400 K/uL   nRBC 0.0 0.0 - 0.2 %    Comment: Performed at [facility], [address] [place], [place] [address]  ECHOCARDIOGRAM COMPLETE     Status:  None   Collection Time: 02/27/24  2:22 PM  Result Value Ref Range   Weight 2,255.75 oz   Height 64 in   BP 103/76 mmHg   Ao pk vel 1.13 m/s   AV Area VTI 2.23 cm2   AR max vel 2.32 cm2   AV Mean grad 2.0 mmHg   AV Peak grad 5.1 mmHg   Single Plane A4C EF 54.2 %   S' Lateral 3.30 cm   AV Area mean vel 2.12 cm2   Area-P 1/2 5.54 cm2   MV VTI 2.21 cm2   Est EF 55 - 60%   Antithrombin III      Status: None   Collection Time: 02/27/24  4:45 PM  Result Value Ref Range   AntiThromb III Func 112 75 - 120 %    Comment: Performed at [facility], [address], [place], [place] [address]  Protein C activity     Status: None   Collection Time: 02/27/24  4:45 PM  Result Value Ref Range   Protein C Activity 120 73 - 180 %    Comment: (NOTE) Performed At: [facility] [address] [place], [place] [id number] Pearlean Botts MD [id number]   Protein C, total     Status: None   Collection Time: 02/27/24  4:45 PM  Result Value Ref Range   Protein C, Total 91 60 - 150 %    Comment: (NOTE) Performed At: [facility] [address] [place], [place] [id number] Pearlean Botts MD [id number]   Protein S activity     Status: None  Collection Time: 02/27/24  4:45 PM  Result Value Ref Range   Protein S Activity 81 63 - 140 %    Comment: (NOTE) Protein S activity may be falsely increased (masking an abnormal, low result) in patients receiving direct Xa inhibitor (e.g., rivaroxaban, apixaban, edoxaban) or a direct thrombin inhibitor (e.g., dabigatran) anticoagulant treatment due to assay interference by these drugs. Performed At: [facility] [address] [place], [place] [id number] Pearlean Botts MD [id number]   Protein S, total     Status: None   Collection Time: 02/27/24  4:45 PM  Result Value Ref Range   Protein S Ag, Total 91 60 - 150 %    Comment: (NOTE) This test was developed and its performance characteristics determined by [facility]. It  has not been cleared or approved by the Food and Drug Administration. Performed At: [facility] [address] [place], [place] [id number] Pearlean Botts MD [id number]   Lupus anticoagulant panel     Status: None   Collection Time: 02/27/24  4:45 PM  Result Value Ref Range   PTT Lupus Anticoagulant 38.3 0.0 - 43.5 sec   DRVVT 32.4 0.0 - 47.0 sec   Lupus Anticoag Interp Comment:     Comment: (NOTE) No lupus anticoagulant was detected. Performed At: [facility] [address] [place], [place] [id number] Pearlean Botts MD [id number]   Beta-2 -glycoprotein i abs, IgG/M/A     Status: None   Collection Time: 02/27/24  4:45 PM  Result Value Ref Range   Beta-2  Glyco I IgG <9 0 - 20 GPI IgG units    Comment: (NOTE) The reference interval reflects a 3SD or 99th percentile interval, which is thought to represent a potentially clinically significant result in accordance with the International Consensus Statement on the classification criteria for definitive antiphospholipid syndrome (APS). J Thromb Haem 2006;4:295-306.    Beta-2 -Glycoprotein I IgM <9 0 - 32 GPI IgM units    Comment: (NOTE) The reference interval reflects a 3SD or 99th percentile interval, which is thought to represent a potentially clinically significant result in accordance with the International Consensus Statement on the classification criteria for definitive antiphospholipid syndrome (APS). J Thromb Haem 2006;4:295-306. Performed At: [facility] [address] [place], [place] [id number] Pearlean Botts MD [id number]    Beta-2 -Glycoprotein I IgA <9 0 - 25 GPI IgA units    Comment: (NOTE) The reference interval reflects a 3SD or 99th percentile interval, which is thought to represent a potentially clinically significant result in accordance with the International Consensus Statement on the classification criteria for definitive antiphospholipid syndrome (APS). J Thromb Haem  2006;4:295-306.   Homocysteine, serum     Status: None   Collection Time: 02/27/24  4:45 PM  Result Value Ref Range   Homocysteine 13.2 0.0 - 14.5 umol/L    Comment: (NOTE) Performed At: [facility] [address] [place], [place] [id number] Pearlean Botts MD [id number]   Factor 5 leiden     Status: None   Collection Time: 02/27/24  4:45 PM  Result Value Ref Range   Recommendations-F5LEID: Comment     Comment: (NOTE) Result: c.1601G>A (p.Arg534Gln) - Not Detected This result is not associated with an increased risk for venous thromboembolism. See Additional Clinical Information and Comments. Additional Clinical Information: Venous thromboembolism is a multifactorial disease influenced by genetic, environmental, and circumstantial risk factors. The c.1601G>A (p. Arg534Gln) variant in the F5 gene, commonly referred to as Factor V Leiden, is a genetic risk factor for venous thromboembolism. Heterozygous carriers  of this variant have a 6- to 8- fold increased risk for venous thromboembolism. Individuals homozygous for this variant (ie, with a copy of the variant on each chromosome) have an approximately 80-fold increased risk for venous thromboembolism. Individuals who carry both a c.*97G>A variant in the F2 gene and Factor V Leiden have an approximately 20-fold increased risk for venous thromboembolism. Risks are likely to be even higher in more complex genotype combinations in volving the F2 c.*97G>A variant and Factor V Leiden (PMID: 16109604). Additional risk factors include but are not limited to: deficiency of protein C, protein S, or antithrombin III , age, female sex, personal or family history of deep vein thromboembolism, smoking, surgery, prolonged immobilization, malignant neoplasm, tamoxifen treatment, raloxifene treatment, oral contraceptive use, hormone replacement therapy, and pregnancy. Management of thrombotic risk and thrombotic events should follow  established guidelines and fit the clinical circumstance. This result cannot predict the occurrence or recurrence of a thrombotic event. Comment: Genetic counseling is recommended to discuss the potential clinical implications of positive results, as well as recommendations for testing family members. Genetic Coordinators are available for health care providers to discuss results at [phone] Test Details: Variant Analyzed: c.1601G>A (p. Arg534Gln), referred to as Fact or V Leiden Methods/Limitations: DNA analysis of the F5 gene (NM_000130.5) was performed by PCR amplification followed by restriction enzyme analysis. The diagnostic sensitivity is >99%. Results must be combined with clinical information for the most accurate interpretation. Molecular- based testing is highly accurate, but as in any laboratory test, diagnostic errors may occur. False positive or false negative results may occur for reasons that include genetic variants, blood transfusions, bone marrow transplantation, somatic or tissue-specific mosaicism, mislabeled samples, or erroneous representation of family relationships. This test was developed and its performance characteristics determined by [facility]. It has not been cleared or approved by the Food and Drug Administration. References: Bhatt S, Taylor AK, Lozano R, [facility], Manfred [facility]; [facility] Professional Practice and Guidelines Committee. Addendum: [employer] consensus statement on fac tor V Leiden mutation testing. Genet Med. 2021 Mar 5. doi: 81.1914/N82956-213- 01108-x. PMID: 08657846. Nanetta Baars. Factor V Leiden Thrombophilia. 1999 May 14 (Updated 2018 Jan 4). In: Jerri Morale, Ardinger HH, Pagon RA, et al., editors. GeneReviews(R) (Internet). [address] [facility]): [employer] , [place]; 9629-5284. Available from: [email or web address]-mcgee.org/ Lynda Sands, Huang X, Luo B, Spector EB, Nicolasa Barrett CS; [facility] Laboratory Quality Assurance Committee. Venous thromboembolism laboratory testing (factor V Leiden and factor II c.*97G>A), 2018 update: a technical standard of the [employer] of [employer] and [employer] ([facility]). Genet Med. 2018 Dec;20(12):1489-1498. doi: 10.1038/s41436-8061283405-z. Epub 2018 Oct 5. PMID: 13244010.    Reviewed By: Comment     Comment: (NOTE) Technical Component performed at [employer] [facility] Professional Component performed by: [employer] [facility] [employer] Melodie Spry, Ph.D., [facility] Director, Molecular Genetics [address] [place] [address] Performed At: [facility] [address] [place], [place] [id number] Adams Adams MDPhD [id number]   Prothrombin gene mutation     Status: None   Collection Time: 02/27/24  4:45 PM  Result Value Ref Range   Recommendations-PTGENE: Comment     Comment: (NOTE) Result: c.*97G>A - Not Detected This result is not associated with an increased risk for venous thromboembolism. See Additional Clinical Information and Comments. Additional Clinical Information: Venous thromboembolism is a multifactorial disease influenced by genetic, environmental, and circumstantial risk factors. The c.*97G>A variant in the F2 gene is a genetic risk factor for venous thromboembolism. Heterozygous  carriers have a 2- to 4-fold increased risk for venous thromboembolism. Homozygotes for the c.*97G>A variant are rare. The annual risk of VTE in homozygotes has been reported to be 1.1%/year. Individuals who carry both a c.*97G>A variant in the F2 gene and a c.1601G>A (p. Arg534Gln) variant in the F5 gene (commonly referred to as Factor V Leiden) have an approximately 20- fold increased risk for venous thromboembolism. Risks are likely to be even higher in more complex genotype combinations involving the F2 c.*97G>A variant and Factor V Leiden (PMID:  09811914). Additional risk factors include but are not  limited to: deficiency of protein C, protein S, or antithrombin III , age, female sex, personal or family history of deep vein thromboembolism, smoking, surgery, prolonged immobilization, malignant neoplasm, tamoxifen treatment, raloxifene treatment, oral contraceptive use, hormone replacement therapy, and pregnancy. Management of thrombotic risk and thrombotic events should follow established guidelines and fit the clinical circumstance. This result cannot predict the occurrence or recurrence of a thrombotic event. Comments: Genetic counseling is recommended to discuss the potential clinical implications of positive results, as well as recommendations for testing family members. Genetic Coordinators are available for health care providers to discuss results at [phone]). 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 The diagnostic sensitivity is >99%. Results must be combined with clinical information for the most accurate interpretation. Molecular-based testing is highly accurate, but as in any laboratory test, diagnostic errors may occur. False positive or false negative results may occur for reasons that include genetic variants, blood transfusions, bone marrow transplantation, somatic or tissue-specific mosaicism, mislabeled samples, or erroneous representation of family relationships. This test was developed and its performance characteristics determined by [facility]. It has not been cleared or approved by the Food and Drug Administration. References: Bhatt S, Taylor AK, Lozano R, [facility], [facility]; [facility] Professional Practice and Guidelines Committee. Addendum: [employer] of [employer] Gen[employer] consensus statement on factor V Leiden mutation testing. Genet Med. 2021 Mar 5. doi: 56.2130/Q657  [phone]-x. PMID: 84696295. Nanetta Baars. Prothrombin Thrombophilia. 2006 Jul 25 [Updated 2021 Feb 4]. In: Jerri Morale, Ardinger HH, Pagon RA, et al., editors. GeneReviews(R) [Internet]. [address] [facility]): [employer] [place] , [place]; 2841-3244. Available from: [email or web address]/ Lynda Sands, Huang X, Luo B, Spector EB, Nicolasa Barrett CS; [facility] Laboratory Quality Assurance Committee. Venous thromboembolism laboratory testing (factor V Leiden and factor II c.*97G>A), 2018 update: a technical standard of the [employer] of [employer] ([facility]). Genet Med. 2018 Dec;20(12):1489-1498. doi: 10.1038/[id number]-z. Epub 2018 Oct 5. PMID: 01027253.    Reviewed by: Comment     Comment: (NOTE) Technical Component performed at [facility] Professional Component performed by: [employer], Ph.D., [facility] Director, Molecular Genetics [address] [place] [address] Performed At: [employer] [address] [place], [place] [id number] Adams Adams MDPhD [id number]   Cardiolipin antibodies, IgG, IgM, IgA     Status: None   Collection Time: 02/27/24  4:45 PM  Result Value Ref Range   Anticardiolipin IgG <9 0 - 14 GPL U/mL    Comment: (NOTE)                          Negative:              <15  Indeterminate:     15 - 20                          Low-Med Positive: >20 - 80                          High Positive:         >80    Anticardiolipin IgM <9 0 - 12 MPL U/mL    Comment: (NOTE)                          Negative:              <13                          Indeterminate:     13 - 20                          Low-Med Positive: >20 - 80                          High Positive:         >80    Anticardiolipin IgA <9 0 - 11 APL U/mL    Comment: (NOTE)                          Negative:              <12                          Indeterminate:     12 - 20                           Low-Med Positive: >20 - 80                          High Positive:         >80 Performed At: [facility] [address] [place], [place] [id number] Pearlean Botts MD WU:[id number]   Glucose, capillary     Status: None   Collection Time: 02/28/24  5:14 AM  Result Value Ref Range   Glucose-Capillary 85 70 - 99 mg/dL    Comment: Glucose reference range applies only to samples taken after fasting for at least 8 hours.    Radiology VAS US  LOWER EXTREMITY VENOUS REFLUX Result Date: 05/18/2024  Lower Venous Reflux Study Patient Name:  VERNESHA TALBOT  Date of Exam:   05/17/2024 Medical Rec #: [id number]           Accession #:    [id number] Date of Birth: [birth year]           Patient Gender: F Patient Age:   28 years Exam Location:  [facility] Procedure:      VAS US  LOWER EXTREMITY VENOUS REFLUX Referring Phys: Mikki Alexander --------------------------------------------------------------------------------  Indications: Varicosities, and Pain. Other Indications: H/O vein procedure. Performing Technologist: Florie Husband RT, RDMS, RVT  Examination Guidelines: A complete evaluation includes B-mode imaging, spectral Doppler, color Doppler, and power Doppler as needed of all accessible portions of each vessel. Bilateral testing is considered an integral part of a complete examination.  Limited examinations for reoccurring indications may be performed as noted. The reflux portion of the exam is performed with the patient in reverse Trendelenburg. Significant venous reflux is defined as >500 ms in the superficial venous system, and >1 second in the deep venous system.  Venous Reflux Times +--------------+---------+------+-----------+------------+--------------+ RIGHT         Reflux NoRefluxReflux TimeDiameter cmsComments                               Yes                                        +--------------+---------+------+-----------+------------+--------------+ CFV                      yes   >1 second                            +--------------+---------+------+-----------+------------+--------------+ FV mid        no                                                   +--------------+---------+------+-----------+------------+--------------+ Popliteal     no                                                   +--------------+---------+------+-----------+------------+--------------+ GSV at SFJ    no                                                   +--------------+---------+------+-----------+------------+--------------+ GSV prox thighno                                                   +--------------+---------+------+-----------+------------+--------------+ GSV at knee   no                                                   +--------------+---------+------+-----------+------------+--------------+ SSV prox calf                                       not visualized +--------------+---------+------+-----------+------------+--------------+  +-----------------------+---------+------+-----------+------------+------------+ LEFT                   Reflux NoRefluxReflux TimeDiameter cmsComments                                      Yes                                      +-----------------------+---------+------+-----------+------------+------------+  CFV                              yes   >1 second                          +-----------------------+---------+------+-----------+------------+------------+ FV mid                           yes   >1 second                          +-----------------------+---------+------+-----------+------------+------------+ Popliteal                        yes   >1 second                          +-----------------------+---------+------+-----------+------------+------------+ GSV at [facility]                       yes    >500 ms      0.52                  +-----------------------+---------+------+-----------+------------+------------+ GSV prox thigh                                               not                                                                       visualized   +-----------------------+---------+------+-----------+------------+------------+ GSV mid thigh                    yes    >500 ms      0.35                 +-----------------------+---------+------+-----------+------------+------------+ GSV dist thigh                   yes    >500 ms      0.45                 +-----------------------+---------+------+-----------+------------+------------+ GSV at knee                      yes    >500 ms      0.30                 +-----------------------+---------+------+-----------+------------+------------+ GSV prox calf                    yes    >500 ms      0.31                 +-----------------------+---------+------+-----------+------------+------------+ SSV prox calf  not                                                                       visualized   +-----------------------+---------+------+-----------+------------+------------+ Ant.Acc.Saph.V-prox              yes    >500 ms      0.37    see comment  thigh                                                        below        +-----------------------+---------+------+-----------+------------+------------+ Left anterior accessory saphenous vein (roughly 7-8cm long straight segment) appears to feed back into prx/mid thigh varicosities then the varicosities appear to communicate back into the patent GSV at the mid thight level.  Summary: Right: - No evidence of deep vein thrombosis seen in the right lower extremity, from the common femoral through the popliteal veins. - No evidence of superficial venous thrombosis in the right lower extremity. - No evidence of superficial venous reflux seen  in the right greater saphenous vein. - Venous reflux is noted in the right common femoral vein.  Left: - No evidence of deep vein thrombosis seen in the left lower extremity, from the common femoral through the popliteal veins. - No evidence of superficial venous thrombosis in the left lower extremity. - Venous reflux is noted throughout the left femoral-popliteal venous system. - Venous reflux is noted in the left sapheno-femoral junction. - Venous reflux is noted in the left anterior accessory saphenous vein in the proximal thigh and its varicosities. - Venous reflux is noted in the left greater saphenous vein from the mid thigh to proximal calf.  *See table(s) above for measurements and observations. Electronically signed by Mikki Alexander MD on 05/18/2024 at 7:14:26 AM.    Final     Assessment/Plan  Varicose veins of bilateral lower extremities with other complications She had a  venous reflux study was performed last week.  This demonstrates no evidence of deep venous thrombosis or superficial thrombophlebitis.  There was no significant venous reflux noted on the right leg.  On the left leg, the left great saphenous vein demonstrated reflux as did the left anterior accessory saphenous vein.  Given this finding, treatment of her symptomatic venous disease which has been refractory to compression socks, elevation, exercise, and anti-inflammatories would be reasonable.  She has CEAP class III venous insufficiency.  I have discussed the risks and benefits of laser ablation of the left great saphenous vein.  I have discussed the small risk of DVT, bleeding, infection, and recurrence.  I discussed that this would not eliminate the prominent vein branches or anterior accessory saphenous vein that would likely need to be treated with foam sclerotherapy after laser ablation.  Patient voices her understanding and desires to proceed.    Mikki Alexander, MD  05/22/2024 12:26 PM    This note was created with Dragon  medical transcription system.  Any errors from dictation are purely unintentional

## 2024-06-28 ENCOUNTER — Telehealth (INDEPENDENT_AMBULATORY_CARE_PROVIDER_SITE_OTHER): Payer: Self-pay | Admitting: Vascular Surgery

## 2024-06-28 NOTE — Telephone Encounter (Signed)
 LVM for pt TCB and schedule appt for laser ablation with Dr. Marea  left leg GSV laser. see jd. auth # [id number]. exp: 8.11.25 - 12.11.25   1 week post left leg GSV laser   4 week post left leg GSV laser. see jd/fb

## 2024-07-03 ENCOUNTER — Other Ambulatory Visit: Payer: Self-pay | Admitting: Cardiology

## 2024-07-03 ENCOUNTER — Other Ambulatory Visit: Payer: Self-pay | Admitting: Neurology

## 2024-07-03 ENCOUNTER — Telehealth (INDEPENDENT_AMBULATORY_CARE_PROVIDER_SITE_OTHER): Payer: Self-pay | Admitting: Vascular Surgery

## 2024-07-03 NOTE — Telephone Encounter (Signed)
 Requested Prescriptions   Pending Prescriptions Disp Refills   pregabalin  (LYRICA ) 25 MG capsule [Pharmacy Med Name: PREGABALIN  25 MG CAPSULE] 30 capsule     Sig: TAKE 1 CAPSULE BY MOUTH 2 TIMES DAILY.   Last seen 02/14/24 Next appt 08/21/24 Dispenses   Dispensed Days Supply Quantity Provider Pharmacy  PREGABALIN  25MG      CAP 05/04/2024 90 180 each Gregg Lek, MD [facility]  PREGABALIN  25MG      CAP 02/15/2024 90 180 each Camara, Amadou, MD [facility]  PREGABALIN  25MG      CAP 10/27/2023 90 180 each Camara, Amadou, MD [facility]  PREGABALIN  25 MG CAPSULE 10/15/2023 15 30 each Camara, Amadou, MD [facility] [phone] - [facility]...  PREGABALIN  25MG  CAP 08/04/2023 90 180 capsule Gregg Lek, MD [facility]  PREGABALIN  25 MG CAPSULE 07/09/2023 15 30 each Camara, Amadou, MD [facility] [phone] - B...    I do not think she is eligible until 08/04/24   Please refuse refill

## 2024-07-03 NOTE — Telephone Encounter (Signed)
 LVM for pt TCB and schedule appt for laser ablation with Dr. Marea  left leg GSV laser. see jd. auth # [id number]. exp: 8.11.25 - 12.11.25   1 week post left leg GSV laser   4 week post left leg GSV laser. see jd/fb

## 2024-07-13 ENCOUNTER — Other Ambulatory Visit (INDEPENDENT_AMBULATORY_CARE_PROVIDER_SITE_OTHER): Payer: Self-pay

## 2024-07-13 ENCOUNTER — Telehealth (INDEPENDENT_AMBULATORY_CARE_PROVIDER_SITE_OTHER): Payer: Self-pay | Admitting: Vascular Surgery

## 2024-07-13 NOTE — Telephone Encounter (Signed)
 sent

## 2024-07-13 NOTE — Telephone Encounter (Signed)
 Patient is scheduled for a left leg GSV laser ablation with Dr. Marea on 10.17.25. Patient will need the standard RX protocol [facility] on [address] and [employer] in [place]. Thank you.

## 2024-07-16 MED ORDER — ALPRAZOLAM 0.5 MG PO TABS: ORAL_TABLET | ORAL | 0 refills | Status: DC

## 2024-07-30 ENCOUNTER — Other Ambulatory Visit: Payer: Self-pay | Admitting: Family Medicine

## 2024-07-30 DIAGNOSIS — Z1231 Encounter for screening mammogram for malignant neoplasm of breast: Secondary | ICD-10-CM

## 2024-08-21 ENCOUNTER — Encounter: Payer: Self-pay | Admitting: Neurology

## 2024-08-21 ENCOUNTER — Ambulatory Visit (INDEPENDENT_AMBULATORY_CARE_PROVIDER_SITE_OTHER): Admitting: Neurology

## 2024-08-21 VITALS — BP 100/62 | HR 102 | Ht 64.0 in | Wt 148.0 lb

## 2024-08-21 DIAGNOSIS — G2581 Restless legs syndrome: Secondary | ICD-10-CM

## 2024-08-21 DIAGNOSIS — G250 Essential tremor: Secondary | ICD-10-CM | POA: Diagnosis not present

## 2024-08-21 DIAGNOSIS — G629 Polyneuropathy, unspecified: Secondary | ICD-10-CM | POA: Diagnosis not present

## 2024-08-21 DIAGNOSIS — M329 Systemic lupus erythematosus, unspecified: Secondary | ICD-10-CM | POA: Diagnosis not present

## 2024-08-21 MED ORDER — PREGABALIN 25 MG PO CAPS: 25.0000 mg | ORAL_CAPSULE | Freq: Two times a day (BID) | ORAL | 5 refills | Status: DC

## 2024-08-21 MED ORDER — CLONAZEPAM 0.5 MG PO TABS: 0.5000 mg | ORAL_TABLET | Freq: Two times a day (BID) | ORAL | 0 refills | Status: AC | PRN
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
## 2024-08-21 NOTE — Patient Instructions (Signed)
 Continue current medications  Will contact cardiologist to discuss switching Metoprolol  to Propranolol  Use Clonazepam  during severe episode of tremors  Return in 6 months or sooner if worse

## 2024-08-24 ENCOUNTER — Other Ambulatory Visit (INDEPENDENT_AMBULATORY_CARE_PROVIDER_SITE_OTHER): Admitting: Vascular Surgery

## 2024-08-28 ENCOUNTER — Ambulatory Visit
Admission: RE | Admit: 2024-08-28 | Discharge: 2024-08-28 | Disposition: A | Discharge status: Home / Self Care | Source: Ambulatory Visit | Attending: Family Medicine | Admitting: Family Medicine

## 2024-08-28 ENCOUNTER — Other Ambulatory Visit: Payer: Self-pay | Admitting: Medical Genetics

## 2024-08-28 DIAGNOSIS — Z1231 Encounter for screening mammogram for malignant neoplasm of breast: Secondary | ICD-10-CM | POA: Diagnosis present

## 2024-08-30 ENCOUNTER — Other Ambulatory Visit
Admission: RE | Admit: 2024-08-30 | Discharge: 2024-08-30 | Disposition: A | Discharge status: Home / Self Care | Payer: Self-pay | Source: Ambulatory Visit | Attending: Medical Genetics | Admitting: Medical Genetics

## 2024-09-07 LAB — GENECONNECT MOLECULAR SCREEN: Genetic Analysis Overall Interpretation: NEGATIVE

## 2024-09-10 NOTE — Progress Notes (Unsigned)
 Electrophysiology Clinic Note    Date:  09/11/2024  Patient ID:  Christina Meza, Christina Meza 1966-03-24, MRN [id number] PCP:  Edman Marsa PARAS, DO  Cardiologist:  None  Electrophysiologist:  Elspeth Sage, MD  Electrophysiology APP:  Rhayne Chatwin, NP     Discussed the use of AI scribe software for clinical note transcription with the patient, who gave verbal consent to proceed.   Patient Profile    Chief Complaint: sinus tachy follow-up  History of Present Illness: Christina Meza is a 58 y.o. female with PMH notable for inappropriate ST iso SLE, Raynauds, essential tremor; seen today for Elspeth Sage, MD for routine electrophysiology followup.   I last saw her 09/2023 after rheumatologist had started amlodipine for Reynauds. Neurologist had increased BB for ET. She was feeling well with some fatigue.  She had syncopal episode 02/2024, thought d/t GI illness. Updated TTE was stable. Zio monitor at discharge without significant concerns.   On follow-up today, she has not had any further syncope episodes. Her palpitations are well-controlled. Her neurologist is interested in adjusting her BB from metoprolol  to propanolol for ET.   She denies chest pain, chest pressure, SOB.   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   Arrhythmia/Device History No specialty comments available.    ROS:  Please see the history of present illness. All other systems are reviewed and otherwise negative.    Physical Exam    VS:  BP 98/60 (BP Location: Left Arm, Patient Position: Sitting, Cuff Size: Normal)   Pulse 81   Ht 5' 4 (1.626 m)   Wt 147 lb (66.7 kg)   LMP 07/15/2015 (Within Days)   SpO2 95%   BMI 25.23 kg/m  BMI: Body mass index is 25.23 kg/m.           Wt Readings from Last 3 Encounters:  09/11/24 147 lb (66.7 kg)  08/21/24 148 lb (67.1 kg)  05/22/24 147 lb (66.7 kg)     GEN- The patient is well appearing, alert and oriented x 3 today.   Lungs- Clear to ausculation  bilaterally, normal work of breathing.  Heart- Regular rate and rhythm, no murmurs, rubs or gallops Extremities- No peripheral edema, warm, dry   Studies Reviewed   Previous EP, cardiology notes.    EKG is ordered. Personal review of EKG from today shows:    EKG Interpretation Date/Time:  Tuesday September 11 2024 10:42:59 EDT Ventricular Rate:  81 PR Interval:  154 QRS Duration:  104 QT Interval:  412 QTC Calculation: 478 R Axis:   -5  Text Interpretation: Normal sinus rhythm Possible Left atrial enlargement Incomplete right bundle branch block No significant change was found Reconfirmed by Jaylina Ramdass [phone]) on 09/11/2024 10:50:51 AM     Long term monitor, 03/22/2024 HR 68 - 164, average 95 bpm. 1 nonsustained VT lasting 4 beats. 3 nonsustained SVT, longest 15 beats. Rare supraventricular and ventricular ectopy. No sustained arrhythmias. No atrial fibrillation.  TTE, 02/27/2024  1. Left ventricular ejection fraction, by estimation, is 55 to 60%. The left ventricle has normal function. The left ventricle has no regional wall motion abnormalities. There is moderate asymmetric left ventricular  hypertrophy of the basal-septal segment. Left ventricular diastolic parameters are consistent with Grade I diastolic dysfunction (impaired relaxation). The global longitudinal strain is abnormal.   2. Right ventricular systolic function is normal. The right ventricular size is normal. There is normal pulmonary artery systolic pressure. The estimated right ventricular systolic pressure is 29.0 mmHg.  3. The mitral valve is normal in structure. Mild mitral valve regurgitation. No evidence of mitral stenosis.   4. The aortic valve is normal in structure. Aortic valve regurgitation is not visualized. No aortic stenosis is present.   5. Aortic dilatation noted. There is mild dilatation of the aortic root, measuring 41 mm.   6. The inferior vena cava is normal in size with greater than 50%  respiratory variability, suggesting right atrial pressure of 3 mmHg.   TTE, 09/08/2021  1. Left ventricular ejection fraction, by estimation, is 50 to 55%. The left ventricle has low normal function. The left ventricle demonstrates global hypokinesis. Left ventricular diastolic parameters are consistent with Grade I diastolic dysfunction (impaired relaxation). The average left ventricular global longitudinal strain is -14.7 %.   2. Right ventricular systolic function is normal. The right ventricular size is normal. There is normal pulmonary artery systolic pressure. The estimated right ventricular systolic pressure is 22.3 mmHg.   3. The mitral valve is normal in structure. Mild mitral valve regurgitation. No evidence of mitral stenosis.   4. The aortic valve is normal in structure. Trileaflet. Aortic valve regurgitation is mild. No aortic stenosis is present.   5. There is dilatation of the aortic root, measuring 39 mm. There is mild dilatation of the ascending aorta, measuring 40 mm.   Comparison(s): LVEF 50-55%, Asc. Aorta 3.7 cm.    CT coronary morph, 08/28/2020 1. Coronary calcium  score of 0. Patient is low risk for near term coronary events  2. Normal coronary origin with right dominance.  3. No evidence of CAD.  4. CAD-RADS 0. Consider non-atherosclerotic causes of chest pain.    Assessment and Plan     #) inappropriate sinus tachy Well controlled on 25mg  lopressor  BID OK to transition to propranolol per neurology  #) syncope Likely vasovagal iso GI illness No further syncopal episodes If syncope recurs without precipitating event, would recommend ILR to further eval        Current medicines are reviewed at length with the patient today.   The patient does not have concerns regarding her medicines.  The following changes were made today:  none  Labs/ tests ordered today include:  Orders Placed This Encounter  Procedures   EKG 12-Lead     Disposition: Follow up with  Dr. Kennyth or EP APP in 6 months, sooner if needed   Signed, Chantal Needle, NP  09/11/24  12:36 PM  Electrophysiology [facility]

## 2024-09-11 ENCOUNTER — Ambulatory Visit: Attending: Cardiology | Admitting: Cardiology

## 2024-09-11 ENCOUNTER — Encounter: Payer: Self-pay | Admitting: Cardiology

## 2024-09-11 VITALS — BP 98/60 | HR 81 | Ht 64.0 in | Wt 147.0 lb

## 2024-09-11 DIAGNOSIS — R55 Syncope and collapse: Secondary | ICD-10-CM | POA: Diagnosis not present

## 2024-09-11 DIAGNOSIS — I4711 Inappropriate sinus tachycardia, so stated: Secondary | ICD-10-CM

## 2024-09-11 NOTE — Patient Instructions (Signed)
 Medication Instructions:   Your physician recommends that you continue on your current medications as directed. Please refer to the Current Medication list given to you today.    *If you need a refill on your cardiac medications before your next appointment, please call your pharmacy*  Lab Work:  No labs ordered today   If you have labs (blood work) drawn today and your tests are completely normal, you will receive your results only by: MyChart Message (if you have MyChart) OR A paper copy in the mail If you have any lab test that is abnormal or we need to change your treatment, we will call you to review the results.  Testing/Procedures:  No test ordered today   Follow-Up: At [facility], you and your health needs are our priority.  As part of our continuing mission to provide you with exceptional heart care, our providers are all part of one team.  This team includes your primary Cardiologist (physician) and Advanced Practice Providers or APPs (Physician Assistants and Nurse Practitioners) who all work together to provide you with the care you need, when you need it.  Your next appointment:   6 month(s)  Provider:   Dr. Kennyth    We recommend signing up for the patient portal called MyChart.  Sign up information is provided on this After Visit Summary.  MyChart is used to connect with patients for Virtual Visits (Telemedicine).  Patients are able to view lab/test results, encounter notes, upcoming appointments, etc.  Non-urgent messages can be sent to your provider as well.   To learn more about what you can do with MyChart, go to [email or web address].

## 2024-09-12 ENCOUNTER — Encounter: Payer: Self-pay | Admitting: Neurology

## 2024-09-21 ENCOUNTER — Encounter (INDEPENDENT_AMBULATORY_CARE_PROVIDER_SITE_OTHER): Payer: Self-pay | Admitting: Vascular Surgery

## 2024-09-21 ENCOUNTER — Ambulatory Visit (INDEPENDENT_AMBULATORY_CARE_PROVIDER_SITE_OTHER): Admitting: Vascular Surgery

## 2024-09-21 VITALS — BP 98/62 | HR 90 | Resp 16 | Wt 145.0 lb

## 2024-09-21 DIAGNOSIS — I83893 Varicose veins of bilateral lower extremities with other complications: Secondary | ICD-10-CM

## 2024-09-21 NOTE — Progress Notes (Signed)
 Christina Meza is a 58 y.o. female who presents with symptomatic venous reflux  Past Medical History:  Diagnosis Date   Allergy    Arthritis    Asthma    Clostridium difficile infection    2015x2 , 2016x1   Depression    Diastolic dysfunction    a. TTE 1/18: EF 50-55%, no RWMA, Gr1DD, mildly dilated aortic root of 3.4 cm, mildly dilated ascending aorta of 3.7 cm, mild mitral regurgitation, normal size left atrium, normal RV systolic function, PASP normal   Genital warts    GERD (gastroesophageal reflux disease)    Inappropriate sinus tachycardia    a. 2010 Nuc Stress test: no ischemia/infarct;  b. 10/2013 Echo: Nl LV fxn, mild MR/TR.   Migraine    Palpitations    Prolonged QT interval    SLE (systemic lupus erythematosus) (HCC)    Stomach ulcer     Past Surgical History:  Procedure Laterality Date   BREAST BIOPSY Right 03/01/2017   u/s core bx neg ADENOSIS AND FOCAL MICROCYST FORMATION.   BREAST CYST ASPIRATION Left 4-5 yrs ago   NEG   CATARACT EXTRACTION  2008   ENDOSCOPIC VEIN LASER TREATMENT     ESOPHAGOGASTRODUODENOSCOPY N/A 07/31/2021   Procedure: ESOPHAGOGASTRODUODENOSCOPY (EGD);  Surgeon: Toledo, Ladell POUR, MD;  Location: [facility] ENDOSCOPY;  Service: Gastroenterology;  Laterality: N/A;   UMBILICAL HERNIA REPAIR     vulvar excision for HPV  07/04/2014     Current Outpatient Medications:    albuterol  (VENTOLIN  HFA) 108 (90 Base) MCG/ACT inhaler, TAKE 2 PUFFS BY MOUTH EVERY 6 HOURS AS NEEDED FOR WHEEZE OR SHORTNESS OF BREATH, Disp: 8.5 each, Rfl: 2   ALPRAZolam  (XANAX ) 0.5 MG tablet, Take 1st tablet 1 hour before procedure then take 2nd tablet once arrived at the office, Disp: 2 tablet, Rfl: 0   Belimumab 200 MG/ML SOAJ, Inject 200 mg into the skin., Disp: , Rfl:    Biotin 5000 MCG TABS, Take 5,000 mcg by mouth every morning., Disp: , Rfl:    cholecalciferol  (VITAMIN D3) 25 MCG (1000 UNIT) tablet, Take 1,000 Units by mouth daily., Disp: , Rfl:    clonazePAM  (KLONOPIN )  0.5 MG tablet, Take 1 tablet (0.5 mg total) by mouth 2 (two) times daily as needed for anxiety (Tremors)., Disp: 10 tablet, Rfl: 0   conjugated estrogens (PREMARIN) vaginal cream, Apply 0.5 g topically., Disp: , Rfl:    diclofenac  sodium (VOLTAREN ) 1 % GEL, Apply 1 application topically as needed., Disp: , Rfl:    DULoxetine  (CYMBALTA ) 60 MG capsule, Take 1 capsule (60 mg total) by mouth daily., Disp: 90 capsule, Rfl: 3   estradiol (CLIMARA - DOSED IN MG/24 HR) 0.025 mg/24hr patch, APPLY 1 PATCH ON THE SKIN  ONCE A WEEK, Disp: , Rfl:    folic acid  (FOLVITE ) 1 MG tablet, Take 1 mg by mouth daily., Disp: , Rfl:    hydroxychloroquine  (PLAQUENIL ) 200 MG tablet, Take 200 mg by mouth 2 (two) times daily. Alternating 200 mg once a day and next day twice a day., Disp: , Rfl:    metoprolol  tartrate (LOPRESSOR ) 25 MG tablet, Take 25 mg by mouth 2 (two) times daily., Disp: , Rfl:    Multiple Vitamins-Minerals (MULTIVITAMIN WITH MINERALS) tablet, Take 1 tablet by mouth every morning., Disp: , Rfl:    naproxen  sodium (ALEVE ) 220 MG tablet, Take 1 tablet (220 mg total) by mouth at bedtime., Disp: , Rfl:    pregabalin  (LYRICA ) 25 MG capsule, Take 1 capsule (25 mg  total) by mouth 2 (two) times daily., Disp: 60 capsule, Rfl: 5   Probiotic Product (PROBIOTIC DAILY PO), Take 1 tablet by mouth daily. , Disp: , Rfl:    progesterone  (PROMETRIUM ) 100 MG capsule, Take 100 mg by mouth at bedtime., Disp: , Rfl:    valACYclovir  (VALTREX ) 500 MG tablet, Take 500 mg by mouth at bedtime., Disp: , Rfl:    rOPINIRole  (REQUIP ) 1 MG tablet, Take 1 tablet (1 mg total) by mouth at bedtime. (Patient not taking: Reported on 09/21/2024), Disp: 90 tablet, Rfl: 3  Allergies  Allergen Reactions   Lansoprazole Hives, Swelling and Anaphylaxis    Lips and tongue   Celebrex [Celecoxib] Other (See Comments) and Itching    Recommended not taking due to a stomach issue.    Sulfa Antibiotics Itching and Hives   Sulfamethoxazole-Trimethoprim  Itching and Hives   Furosemide Itching and Hives     Varicose veins of bilateral lower extremities with other complications   PLAN: The patient's left lower extremity was sterilely prepped and draped. The ultrasound machine was used to visualize the saphenous vein throughout its course. A segment in the mid to upper calf was selected for access. The saphenous vein was accessed without difficulty using ultrasound guidance with a micropuncture needle. A 0.018 wire was then placed and a micropuncture sheath was placed.  A 0.035 J-wire was then placed.  It would not advance up to the saphenofemoral junction as there was a junction to a large superficial varicosity in the mid to proximal thigh and we treated up to this area. The 65 cm sheath was then placed over the wire and the wire and dilator were removed. The laser fiber was then placed through the sheath and its tip was placed approximately 8-10 centimeters below the saphenofemoral junction. Tumescent anesthesia was then created with a dilute lidocaine  solution. Laser energy was then delivered with constant withdrawal of the sheath and laser fiber. Approximately 1080 joules of energy were delivered over a length of 25 centimeters using a 1470 Hz VenaCure machine at 7 W. Sterile dressings were placed. The patient tolerated the procedure well without obvious complications.   Follow-up in 1 week with post-laser duplex.

## 2024-09-28 ENCOUNTER — Ambulatory Visit (INDEPENDENT_AMBULATORY_CARE_PROVIDER_SITE_OTHER)

## 2024-09-28 DIAGNOSIS — I83893 Varicose veins of bilateral lower extremities with other complications: Secondary | ICD-10-CM

## 2024-10-20 ENCOUNTER — Ambulatory Visit
Admission: EM | Admit: 2024-10-20 | Discharge: 2024-10-20 | Disposition: A | Discharge status: Home / Self Care | Attending: Emergency Medicine | Admitting: Emergency Medicine

## 2024-10-20 DIAGNOSIS — J069 Acute upper respiratory infection, unspecified: Secondary | ICD-10-CM

## 2024-10-20 DIAGNOSIS — J0100 Acute maxillary sinusitis, unspecified: Secondary | ICD-10-CM

## 2024-10-20 MED ORDER — AMOXICILLIN-POT CLAVULANATE 875-125 MG PO TABS: 1.0000 | ORAL_TABLET | Freq: Two times a day (BID) | ORAL | 0 refills | Status: DC

## 2024-10-20 NOTE — Discharge Instructions (Addendum)
 Take the Augmentin as directed.  Follow up with your primary care provider if your symptoms are not improving.

## 2024-10-20 NOTE — ED Provider Notes (Signed)
 [facility]    CSN: [id number] Arrival date & time: 10/20/24  0804      History   Chief Complaint Chief Complaint  Patient presents with   Cough   Nasal Congestion    HPI Audrionna Lampton is a 58 y.o. female.  Patient presents with 1 week history of congestion, sore throat, cough productive of green mucus.  No fever, chest pain, shortness of breath, vomiting, diarrhea.  She has been treating her symptoms with Mucinex and NyQuil.  Her medical history includes asthma, lupus, prolonged QT.  The history is provided by the patient and medical records.    Past Medical History:  Diagnosis Date   Allergy    Arthritis    Asthma    Clostridium difficile infection    2015x2 , 2016x1   Depression    Diastolic dysfunction    a. TTE 1/18: EF 50-55%, no RWMA, Gr1DD, mildly dilated aortic root of 3.4 cm, mildly dilated ascending aorta of 3.7 cm, mild mitral regurgitation, normal size left atrium, normal RV systolic function, PASP normal   Genital warts    GERD (gastroesophageal reflux disease)    Inappropriate sinus tachycardia    a. 2010 Nuc Stress test: no ischemia/infarct;  b. 10/2013 Echo: Nl LV fxn, mild MR/TR.   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   Neck pain 06/18/2016   Bilateral wrist pain 05/24/2016   Long term current use of systemic steroids 12/16/2015   Raynaud's disease  without gangrene 12/16/2015   Rheumatoid factor positive 12/16/2015   Inappropriate sinus tachycardia 11/17/2015   Sinus tachycardia 11/12/2015   SLE (systemic lupus erythematosus) (HCC) 11/11/2015   Preventative health care 11/11/2015   Depression, major, recurrent, moderate (HCC) 11/11/2015   Moderate persistent asthma 11/11/2015   GERD (gastroesophageal reflux disease) 11/11/2015   Insomnia 11/11/2015    Past Surgical History:  Procedure Laterality Date   BREAST BIOPSY Right 03/01/2017   u/s core bx neg ADENOSIS AND FOCAL MICROCYST FORMATION.   BREAST CYST ASPIRATION Left 4-5 yrs ago   NEG   CATARACT EXTRACTION  2008   ENDOSCOPIC VEIN LASER TREATMENT     ESOPHAGOGASTRODUODENOSCOPY N/A 07/31/2021   Procedure: ESOPHAGOGASTRODUODENOSCOPY (EGD);  Surgeon: Toledo, Ladell POUR, MD;  Location: [facility] ENDOSCOPY;  Service: Gastroenterology;  Laterality: N/A;   UMBILICAL HERNIA REPAIR     vulvar excision for HPV  07/04/2014    OB History   No obstetric history on file.      Home Medications    Prior to Admission medications   Medication Sig Start Date End Date Taking? Authorizing Provider  amoxicillin -clavulanate (AUGMENTIN ) 875-125 MG tablet Take 1 tablet by mouth every 12 (twelve) hours. 10/20/24  Yes Corlis Burnard DEL, NP  albuterol  (VENTOLIN  HFA) 108 (90 Base) MCG/ACT inhaler TAKE 2 PUFFS BY MOUTH EVERY 6 HOURS AS NEEDED FOR WHEEZE OR SHORTNESS OF BREATH 02/10/22  Karamalegos, Marsa PARAS, DO  ALPRAZolam  (XANAX ) 0.5 MG tablet Take 1st tablet 1 hour before procedure then take 2nd tablet once arrived at the office 09/21/24   Brown, Fallon E, NP  Belimumab 200 MG/ML SOAJ Inject 200 mg into the skin. 01/25/17   [provider]  Biotin 5000 MCG TABS Take 5,000 mcg by mouth every morning.    [provider]  cholecalciferol  (VITAMIN D3) 25 MCG (1000 UNIT) tablet Take 1,000 Units by mouth daily.    [provider]  clonazePAM  (KLONOPIN ) 0.5 MG tablet Take 1 tablet (0.5  mg total) by mouth 2 (two) times daily as needed for anxiety (Tremors). 08/21/24   Camara, Amadou, MD  conjugated estrogens (PREMARIN) vaginal cream Apply 0.5 g topically. 08/29/14   [provider]  diclofenac  sodium (VOLTAREN ) 1 % GEL Apply 1 application topically as needed.    [provider]  DULoxetine  (CYMBALTA ) 60 MG capsule Take 1 capsule (60 mg total) by mouth daily. 10/26/23   Karamalegos, Marsa PARAS, DO  estradiol (CLIMARA - DOSED IN MG/24 HR) 0.025 mg/24hr patch APPLY 1 PATCH ON THE SKIN  ONCE A WEEK 11/30/22   [provider]  folic acid  (FOLVITE ) 1 MG tablet Take 1 mg by mouth daily. 03/10/20   [provider]  hydroxychloroquine  (PLAQUENIL ) 200 MG tablet Take 200 mg by mouth 2 (two) times daily. Alternating 200 mg once a day and next day twice a day.    [provider]  metoprolol  tartrate (LOPRESSOR ) 25 MG tablet Take 25 mg by mouth 2 (two) times daily.    [provider]  Multiple Vitamins-Minerals (MULTIVITAMIN WITH MINERALS) tablet Take 1 tablet by mouth every morning.    [provider]  naproxen  sodium (ALEVE ) 220 MG tablet Take 1 tablet (220 mg total) by mouth at bedtime. 08/10/16   Karamalegos, Marsa PARAS, DO  pregabalin  (LYRICA ) 25 MG capsule Take 1 capsule (25 mg total) by mouth 2 (two) times daily. 08/21/24   Camara, Amadou, MD  Probiotic Product (PROBIOTIC DAILY PO) Take 1 tablet by mouth daily.     [provider]  progesterone  (PROMETRIUM ) 100 MG capsule Take 100 mg by mouth at bedtime.    [provider]  rOPINIRole  (REQUIP ) 1 MG tablet Take 1 tablet (1 mg total) by mouth at bedtime. Patient not taking: Reported on 09/21/2024 02/14/24 02/08/25  Gregg Lek, MD  valACYclovir  (VALTREX ) 500 MG tablet Take 500 mg by mouth at bedtime.    [provider]    Family History Family History  Problem Relation Age of Onset   Arthritis Mother    Bipolar disorder Mother    Depression Mother     Mental illness Mother    Hypertension Maternal Grandmother    Hyperlipidemia Maternal Grandfather    Heart disease Maternal Grandfather    Stroke Maternal Grandfather    Hypertension Maternal Grandfather    Colon cancer Paternal Grandfather    Breast cancer Neg Hx     Social History Social History   Tobacco Use   Smoking status: Never   Smokeless tobacco: Never  Vaping Use   Vaping status: Never Used  Substance Use Topics   Alcohol use: Not Currently    Alcohol/week: 0.0 - 1.0 standard drinks of alcohol   Drug use: No     Allergies   Lansoprazole, Celebrex [celecoxib], Sulfa antibiotics, Sulfamethoxazole-trimethoprim, and Furosemide   Review of Systems Review of Systems  Constitutional:  Negative for chills and fever.  HENT:  Positive for congestion, postnasal drip, rhinorrhea, sinus pressure and sore throat. Negative for ear pain.   Respiratory:  Positive for cough. Negative for shortness of breath and wheezing.   Cardiovascular:  Negative for chest pain and palpitations.  Gastrointestinal:  Negative for diarrhea and vomiting.     Physical Exam Triage Vital Signs ED Triage Vitals  Encounter Vitals Group     BP 10/20/24 0815 114/71     Girls Systolic BP Percentile --      Girls Diastolic BP Percentile --      Boys Systolic BP Percentile --      Boys Diastolic BP Percentile --      Pulse Rate 10/20/24 0815 97     Resp 10/20/24 0815 18     Temp 10/20/24 0815 97.8 F (36.6 C)     Temp src --      SpO2 10/20/24 0815 98 %     Weight --      Height --      Head Circumference --      Peak Flow --      Pain Score 10/20/24 0818 4     Pain Loc --      Pain Education --      Exclude from Growth Chart --    No data found.  Updated Vital Signs BP 114/71   Pulse 97   Temp 97.8 F (36.6 C)   Resp 18   LMP 07/15/2015 (Within Days)   SpO2 98%   Visual Acuity Right Eye Distance:   Left Eye Distance:   Bilateral Distance:    Right Eye Near:   Left Eye  Near:    Bilateral Near:     Physical Exam Constitutional:      General: She is not in acute distress. HENT:     Right Ear: Tympanic membrane normal.     Left Ear: Tympanic membrane normal.     Nose: Congestion and rhinorrhea present.     Mouth/Throat:     Mouth: Mucous membranes are moist.     Pharynx: Oropharynx is clear.  Cardiovascular:     Rate and Rhythm: Normal rate and regular rhythm.     Heart sounds: Normal heart sounds.  Pulmonary:     Effort: Pulmonary effort is normal. No respiratory distress.     Breath sounds: Normal breath sounds.  Neurological:     Mental Status: She is alert.      UC Treatments / Results  Labs (all labs ordered are listed, but only abnormal results are displayed) Labs Reviewed - No data to display  EKG   Radiology No results found.  Procedures Procedures (including critical care time)  Medications Ordered in UC Medications - No data to display  Initial Impression / Assessment and Plan / UC Course  I have reviewed the triage vital signs and the nursing notes.  Pertinent labs & imaging results that were available during my care of the patient were reviewed by me and considered in my medical decision making (see chart for details).    Acute sinusitis, acute upper respiratory infection.  Afebrile and vital signs are stable.  Lungs are clear and O2 sat is 98% on room air.  Patient has been symptomatic for a week and is not improving with OTC treatment.  Treating today with Augmentin .  Instructed patient to follow-up with her PCP if she is not improving.  Education provided on sinusitis and URI.  She agrees to plan of care.  Final  Clinical Impressions(s) / UC Diagnoses   Final diagnoses:  Acute non-recurrent maxillary sinusitis  Acute upper respiratory infection     Discharge Instructions      Take the Augmentin  as directed.  Follow-up with your primary care provider if your symptoms are not improving.      ED  Prescriptions     Medication Sig Dispense Auth. Provider   amoxicillin -clavulanate (AUGMENTIN ) 875-125 MG tablet Take 1 tablet by mouth every 12 (twelve) hours. 14 tablet Corlis Burnard DEL, NP      PDMP not reviewed this encounter.   Corlis Burnard DEL, NP 10/20/24 [phone]

## 2024-10-20 NOTE — ED Triage Notes (Signed)
 Patient to Urgent Care with complaints of  nasal congestion/ productive cough (green)/ sore throat.  Symptoms x6 days.  Taking nyquil and mucinex.

## 2024-10-23 ENCOUNTER — Encounter (INDEPENDENT_AMBULATORY_CARE_PROVIDER_SITE_OTHER): Payer: Self-pay | Admitting: Nurse Practitioner

## 2024-10-23 ENCOUNTER — Ambulatory Visit (INDEPENDENT_AMBULATORY_CARE_PROVIDER_SITE_OTHER): Admitting: Nurse Practitioner

## 2024-10-23 VITALS — BP 103/67 | HR 87 | Resp 18 | Ht 64.0 in | Wt 148.2 lb

## 2024-10-23 DIAGNOSIS — I83893 Varicose veins of bilateral lower extremities with other complications: Secondary | ICD-10-CM | POA: Diagnosis not present

## 2024-10-23 NOTE — Progress Notes (Signed)
 SUBJECTIVE:  Patient ID: Christina Meza, female    DOB: 05-Apr-1966, 58 y.o.   MRN: [id number] Chief Complaint  Patient presents with   Follow-up    Follow up 4 week post left leg GSV laser    Discussed the use of AI scribe software for clinical note transcription with the patient, who gave verbal consent to proceed.  History of Present Illness Christina Meza is a 57 year old female who presents for follow-up after left saphenous vein ablation.  She feels good with no residual pain or discomfort following the ablation. Prior to the procedure, she experienced significant pain and tenderness in her leg, particularly by the end of the day, which was described as aching and throbbing. Post-procedure, these symptoms have resolved, and her leg feels 'fine, great, wonderful'.  The more prominent veins, described as 'three speed bumps', have reduced in prominence. She continues to wear compression stockings, specifically Ted hose or support socks, and reports no significant swelling by the end of the day.  The patient recalls being told after her ultrasound that the treatment was successful and that no clots were found. There was a noted reflux in the accessory saphenous vein, but she has not experienced any bothersome symptoms from this.  She works as a Psychologist, Educational) at a hospital, which involves standing for long periods. Despite this, her leg feels better and she expresses satisfaction with the current state of her symptoms.     Results RADIOLOGY Venous ultrasound: No thrombi, successful ablation, reflux in accessory saphenous vein  Past Medical History:  Diagnosis Date   Allergy    Arthritis    Asthma    Clostridium difficile infection    2015x2 , 2016x1   Depression    Diastolic dysfunction    a. TTE 1/18: EF 50-55%, no RWMA, Gr1DD, mildly dilated aortic root of 3.4 cm, mildly dilated ascending aorta of 3.7 cm, mild mitral regurgitation,  normal size left atrium, normal RV systolic function, PASP normal   Genital warts    GERD (gastroesophageal reflux disease)    Inappropriate sinus tachycardia    a. 2010 Nuc Stress test: no ischemia/infarct;  b. 10/2013 Echo: Nl LV fxn, mild MR/TR.   Migraine    Palpitations    Prolonged QT interval    SLE (systemic lupus erythematosus) (HCC)    Stomach ulcer     Past Surgical History:  Procedure Laterality Date   BREAST BIOPSY Right 03/01/2017   u/s core bx neg ADENOSIS AND FOCAL MICROCYST FORMATION.   BREAST CYST ASPIRATION Left 4-5 yrs ago   NEG   CATARACT EXTRACTION  2008   ENDOSCOPIC VEIN LASER TREATMENT     ESOPHAGOGASTRODUODENOSCOPY N/A 07/31/2021   Procedure: ESOPHAGOGASTRODUODENOSCOPY (EGD);  Surgeon: Toledo, Ladell POUR, MD;  Location: [facility] ENDOSCOPY;  Service: Gastroenterology;  Laterality: N/A;   UMBILICAL HERNIA REPAIR     vulvar excision for HPV  07/04/2014    Social History   Socioeconomic History   Marital status: Married    Spouse name: Not on file   Number of children: Not on file   Years of education: Not on file   Highest education level: Master's degree (e.g., MA, MS, MEng, MEd, MSW, MBA)  Occupational History   Not on file  Tobacco Use   Smoking status: Never   Smokeless tobacco: Never  Vaping Use   Vaping status: Never Used  Substance and Sexual Activity   Alcohol use: Not Currently    Alcohol/week: 0.0 -  1.0 standard drinks of alcohol   Drug use: No   Sexual activity: Yes    Partners: Male  Other Topics Concern   Not on file  Social History Narrative   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   Social Drivers of Corporate Investment Banker Strain: Low Risk  (10/26/2023)   Overall Financial Resource Strain (CARDIA)    Difficulty of Paying Living Expenses: Not hard at all  Food Insecurity: No Food Insecurity (02/27/2024)   Hunger Vital Sign    Worried About Running Out of Food in the Last Year: Never true    Ran Out of Food in the Last Year: Never true   Transportation Needs: No Transportation Needs (02/27/2024)   PRAPARE - Administrator, Civil Service (Medical): No    Lack of Transportation (Non-Medical): No  Physical Activity: Insufficiently Active (10/26/2023)   Exercise Vital Sign    Days of Exercise per Week: 2 days    Minutes of Exercise per Session: 30 min  Stress: Stress Concern Present (10/26/2023)   [employer] of Occupational Health - Occupational Stress Questionnaire    Feeling of Stress : To some extent  Social Connections: Socially Isolated (02/27/2024)   Social Connection and Isolation Panel    Frequency of Communication with Friends and Family: Once a week    Frequency of Social Gatherings with Friends and Family: Once a week    Attends Religious Services: Never    Database Administrator or Organizations: No    Attends Banker Meetings: Never    Marital Status: Married  Catering Manager Violence: Patient Unable To Answer (05/31/2024)   Received from [facility]   Humiliation, Afraid, Rape, and Kick questionnaire    Within the last year, have you been raped or forced to have any kind of sexual activity by your partner or ex-partner?: Patient unable to answer    Within the last year, have you been afraid of your partner or ex-partner?: Patient unable to answer    Within the last year, have you been humiliated or emotionally abused in other ways by your partner or ex-partner?: Patient unable to answer    Within the last year, have you been kicked, hit, slapped, or otherwise physically hurt by your partner or ex-partner?: Patient unable to answer    Family History  Problem Relation Age of Onset   Arthritis Mother    Bipolar disorder Mother    Depression Mother    Mental illness Mother    Hypertension Maternal Grandmother    Hyperlipidemia Maternal Grandfather    Heart disease Maternal Grandfather    Stroke Maternal Grandfather    Hypertension Maternal Grandfather    Colon cancer  Paternal Grandfather    Breast cancer Neg Hx     Allergies  Allergen Reactions   Lansoprazole Hives, Swelling and Anaphylaxis    Lips and tongue   Celebrex [Celecoxib] Other (See Comments) and Itching    Recommended not taking due to a stomach issue.    Sulfa Antibiotics Itching and Hives   Sulfamethoxazole-Trimethoprim Itching and Hives   Furosemide Itching and Hives     Review of Systems   Review of Systems: Negative Unless Checked Constitutional: [] Weight loss  [] Fever  [] Chills Cardiac: [] Chest pain   []  Atrial Fibrillation  [] Palpitations   [] Shortness of breath when laying flat   [] Shortness of breath with exertion. [] Shortness of breath at rest Vascular:  [] Pain in legs with walking   [] Pain in legs with  standing [] Pain in legs when laying flat   [] Claudication    [] Pain in feet when laying flat    [] History of DVT   [] Phlebitis   [] Swelling in legs   [x] Varicose veins   [] Non-healing ulcers Pulmonary:   [] Uses home oxygen   [] Productive cough   [] Hemoptysis   [] Wheeze  [] COPD   [] Asthma Neurologic:  [] Dizziness   [] Seizures  [] Blackouts [] History of stroke   [] History of TIA  [] Aphasia   [] Temporary Blindness   [] Weakness or numbness in arm   [] Weakness or numbness in leg Musculoskeletal:   [] Joint swelling   [] Joint pain   [] Low back pain  []  History of Knee Replacement [] Arthritis [] back Surgeries  []  Spinal Stenosis    Hematologic:  [] Easy bruising  [] Easy bleeding   [] Hypercoagulable state   [] Anemic Gastrointestinal:  [] Diarrhea   [] Vomiting  [] Gastroesophageal reflux/heartburn   [] Difficulty swallowing. [] Abdominal pain Genitourinary:  [] Chronic kidney disease   [] Difficult urination  [] Anuric   [] Blood in urine [] Frequent urination  [] Burning with urination   [] Hematuria Skin:  [] Rashes   [] Ulcers [] Wounds Psychological:  [] History of anxiety   []  History of major depression  []  Memory Difficulties      OBJECTIVE:     BP 103/67 (BP Location: Right Arm, Patient  Position: Sitting, Cuff Size: Normal)   Pulse 87   Resp 18   Ht 5' 4 (1.626 m)   Wt 148 lb 3.2 oz (67.2 kg)   LMP 07/15/2015 (Within Days)   BMI 25.44 kg/m   Physical Exam Vitals reviewed.  HENT:     Head: Normocephalic.  Cardiovascular:     Rate and Rhythm: Normal rate.  Pulmonary:     Effort: Pulmonary effort is normal.  Skin:    General: Skin is warm and dry.     Comments: Small varicosities in LLE  Neurological:     Mental Status: She is alert and oriented to person, place, and time.  Psychiatric:        Mood and Affect: Mood normal.        Behavior: Behavior normal.        Thought Content: Thought content normal.        Judgment: Judgment normal.    Physical Exam     CMP     Component Value Date/Time   NA 136 02/27/2024 0342   NA 140 12/07/2016 1537   K 4.2 02/27/2024 0342   CL 106 02/27/2024 0342   CO2 26 02/27/2024 0342   GLUCOSE 91 02/27/2024 0342   BUN 15 02/27/2024 0342   BUN 14 12/07/2016 1537   CREATININE 0.83 02/27/2024 0342   CREATININE 0.62 01/24/2024 0841   CALCIUM  8.3 (L) 02/27/2024 0342   PROT 5.7 (L) 02/26/2024 1413   PROT 7.1 12/07/2016 1537   ALBUMIN 3.3 (L) 02/26/2024 1413   ALBUMIN 3.9 12/07/2016 1537   AST 24 02/26/2024 1413   ALT 19 02/26/2024 1413   ALKPHOS 61 02/26/2024 1413   BILITOT 0.6 02/26/2024 1413   BILITOT 0.4 12/07/2016 1537   GFR 70.61 11/11/2015 1619   EGFR 104 01/24/2024 0841   GFRNONAA >60 02/27/2024 0342   GFRNONAA 76 05/04/2017 0807    No results found.     ASSESSMENT AND PLAN:  1. Varicose veins of bilateral lower extremities with other complications (Primary)  Assessment & Plan Status post left saphenous vein ablation for varicose veins and accessory saphenous vein reflux Post-procedure, she reported no residual pain or discomfort. Ultrasound confirmed  successful ablation with no clots. Some reflux remains in the accessory saphenous vein without significant symptoms. She preferred to leave the  condition as is. - Continue wearing compression stockings. - Contact the clinic if symptoms worsen or new symptoms develop.     Current Outpatient Medications on File Prior to Visit  Medication Sig Dispense Refill   albuterol  (VENTOLIN  HFA) 108 (90 Base) MCG/ACT inhaler TAKE 2 PUFFS BY MOUTH EVERY 6 HOURS AS NEEDED FOR WHEEZE OR SHORTNESS OF BREATH 8.5 each 2   ALPRAZolam  (XANAX ) 0.5 MG tablet Take 1st tablet 1 hour before procedure then take 2nd tablet once arrived at the office 2 tablet 0   amoxicillin -clavulanate (AUGMENTIN ) 875-125 MG tablet Take 1 tablet by mouth every 12 (twelve) hours. 14 tablet 0   Belimumab 200 MG/ML SOAJ Inject 200 mg into the skin.     Biotin 5000 MCG TABS Take 5,000 mcg by mouth every morning.     cholecalciferol  (VITAMIN D3) 25 MCG (1000 UNIT) tablet Take 1,000 Units by mouth daily.     clonazePAM  (KLONOPIN ) 0.5 MG tablet Take 1 tablet (0.5 mg total) by mouth 2 (two) times daily as needed for anxiety (Tremors). 10 tablet 0   conjugated estrogens (PREMARIN) vaginal cream Apply 0.5 g topically.     diclofenac  sodium (VOLTAREN ) 1 % GEL Apply 1 application topically as needed.     DULoxetine  (CYMBALTA ) 60 MG capsule Take 1 capsule (60 mg total) by mouth daily. 90 capsule 3   estradiol (CLIMARA - DOSED IN MG/24 HR) 0.025 mg/24hr patch APPLY 1 PATCH ON THE SKIN  ONCE A WEEK     folic acid  (FOLVITE ) 1 MG tablet Take 1 mg by mouth daily.     hydroxychloroquine  (PLAQUENIL ) 200 MG tablet Take 200 mg by mouth 2 (two) times daily. Alternating 200 mg once a day and next day twice a day.     metoprolol  tartrate (LOPRESSOR ) 25 MG tablet Take 25 mg by mouth 2 (two) times daily.     Multiple Vitamins-Minerals (MULTIVITAMIN WITH MINERALS) tablet Take 1 tablet by mouth every morning.     naproxen  sodium (ALEVE ) 220 MG tablet Take 1 tablet (220 mg total) by mouth at bedtime.     pregabalin  (LYRICA ) 25 MG capsule Take 1 capsule (25 mg total) by mouth 2 (two) times daily. 60 capsule  5   Probiotic Product (PROBIOTIC DAILY PO) Take 1 tablet by mouth daily.      progesterone  (PROMETRIUM ) 100 MG capsule Take 100 mg by mouth at bedtime.     rOPINIRole  (REQUIP ) 1 MG tablet Take 1 tablet (1 mg total) by mouth at bedtime. 90 tablet 3   valACYclovir  (VALTREX ) 500 MG tablet Take 500 mg by mouth at bedtime.     No current facility-administered medications on file prior to visit.    There are no Patient Instructions on file for this visit. No follow-ups on file.   Sueanne Maniaci E Giovonni Poirier, NP  This note was completed with Office Manager.  Any errors are purely unintentional.

## 2024-11-05 ENCOUNTER — Other Ambulatory Visit: Payer: Self-pay | Admitting: Family Medicine

## 2024-11-05 DIAGNOSIS — F32A Depression, unspecified: Secondary | ICD-10-CM

## 2024-11-08 NOTE — Telephone Encounter (Signed)
  OFFICE VISIT NEEDED FOR ADDITIONAL REFILLS   Requested Prescriptions  Pending Prescriptions Disp Refills   DULoxetine  (CYMBALTA ) 60 MG capsule [Pharmacy Med Name: DULOXETINE  CAP 60MG  DR] 30 capsule 0    Sig: TAKE 1 CAPSULE DAILY     Psychiatry: Antidepressants - SNRI - duloxetine  Failed - 11/08/2024 10:59 AM      Failed - Valid encounter within last 6 months    Recent Outpatient Visits           8 months ago Vasovagal episode   [place] [facility] [place], Marsa PARAS, DO   9 months ago Annual physical exam   [place] [facility] [place], Marsa PARAS, DO              Passed - Cr in normal range and within 360 days    Creat  Date Value Ref Range Status  01/24/2024 0.62 0.50 - 1.03 mg/dL Final   Creatinine, Ser  Date Value Ref Range Status  02/27/2024 0.83 0.44 - 1.00 mg/dL Final   Creatinine, Urine  Date Value Ref Range Status  01/21/2020 44 mg/dL Final         Passed - eGFR is 30 or above and within 360 days    GFR, Est African American  Date Value Ref Range Status  05/04/2017 87 >=60 mL/min Final   GFR calc Af Amer  Date Value Ref Range Status  01/21/2020 >60 >60 mL/min Final   GFR, Est Non African American  Date Value Ref Range Status  05/04/2017 76 >=60 mL/min Final   GFR, Estimated  Date Value Ref Range Status  02/27/2024 >60 >60 mL/min Final    Comment:    (NOTE) Calculated using the CKD-EPI Creatinine Equation (2021)    GFR  Date Value Ref Range Status  11/11/2015 70.61 >60.00 mL/min Final   eGFR  Date Value Ref Range Status  01/24/2024 104 > OR = 60 mL/min/1.35m2 Final         Passed - Completed PHQ-2 or PHQ-9 in the last 360 days      Passed - Last BP in normal range    BP Readings from Last 1 Encounters:  10/23/24 103/67

## 2024-11-29 ENCOUNTER — Other Ambulatory Visit: Payer: Self-pay | Admitting: Family Medicine

## 2024-11-29 DIAGNOSIS — F419 Anxiety disorder, unspecified: Secondary | ICD-10-CM

## 2024-11-30 NOTE — Telephone Encounter (Signed)
 Too soon for refill.  Requested Prescriptions  Pending Prescriptions Disp Refills   DULoxetine  (CYMBALTA ) 60 MG capsule [Pharmacy Med Name: DULOXETINE  CAP 60MG  DR] 30 capsule 0    Sig: TAKE 1 CAPSULE DAILY     Psychiatry: Antidepressants - SNRI - duloxetine  Failed - 11/30/2024  4:15 PM      Failed - Valid encounter within last 6 months    Recent Outpatient Visits           8 months ago Vasovagal episode   [place] [facility] [place], Marsa PARAS, DO   10 months ago Annual physical exam   [place] [facility] [place], Marsa PARAS, DO              Passed - Cr in normal range and within 360 days    Creat  Date Value Ref Range Status  01/24/2024 0.62 0.50 - 1.03 mg/dL Final   Creatinine, Ser  Date Value Ref Range Status  02/27/2024 0.83 0.44 - 1.00 mg/dL Final   Creatinine, Urine  Date Value Ref Range Status  01/21/2020 44 mg/dL Final         Passed - eGFR is 30 or above and within 360 days    GFR, Est African American  Date Value Ref Range Status  05/04/2017 87 >=60 mL/min Final   GFR calc Af Amer  Date Value Ref Range Status  01/21/2020 >60 >60 mL/min Final   GFR, Est Non African American  Date Value Ref Range Status  05/04/2017 76 >=60 mL/min Final   GFR, Estimated  Date Value Ref Range Status  02/27/2024 >60 >60 mL/min Final    Comment:    (NOTE) Calculated using the CKD-EPI Creatinine Equation (2021)    GFR  Date Value Ref Range Status  11/11/2015 70.61 >60.00 mL/min Final   eGFR  Date Value Ref Range Status  01/24/2024 104 > OR = 60 mL/min/1.62m2 Final         Passed - Completed PHQ-2 or PHQ-9 in the last 360 days      Passed - Last BP in normal range    BP Readings from Last 1 Encounters:  10/23/24 103/67

## 2024-12-04 ENCOUNTER — Encounter: Payer: Self-pay | Admitting: Family Medicine

## 2024-12-04 ENCOUNTER — Ambulatory Visit (INDEPENDENT_AMBULATORY_CARE_PROVIDER_SITE_OTHER): Admitting: Family Medicine

## 2024-12-04 VITALS — BP 110/64 | Ht 64.0 in | Wt 143.0 lb

## 2024-12-04 DIAGNOSIS — F419 Anxiety disorder, unspecified: Secondary | ICD-10-CM | POA: Insufficient documentation

## 2024-12-04 DIAGNOSIS — F3341 Major depressive disorder, recurrent, in partial remission: Secondary | ICD-10-CM | POA: Diagnosis not present

## 2024-12-04 MED ORDER — DULOXETINE HCL 60 MG PO CPEP: 60.0000 mg | ORAL_CAPSULE | Freq: Every day | ORAL | 0 refills | Status: AC

## 2024-12-04 NOTE — Patient Instructions (Addendum)
 Thank you for coming to the office today.  Refill sent to [facility] Dulextine 60mg  daily for 90 days 0 refills  Next visit we will re order for 1 year.  Consider Prevnar-20 pneumonia vaccine next time.  Please schedule a Follow-up Appointment to: Return in about 2 months (around 02/02/2025) for 2 months approx Tues 3/10 AM physical (refills for 1 year) then fasting lab after.  If you have any other questions or concerns, please feel free to call the office or send a message through MyChart. You may also schedule an earlier appointment if necessary.  Additionally, you may be receiving a survey about your experience at our office within a few days to 1 week by e-mail or mail. We value your feedback.  Marsa Officer, DO [facility] [place], [place]

## 2024-12-04 NOTE — Progress Notes (Signed)
 "  Subjective:    Patient ID: Christina Meza, female    DOB: Aug 06, 1966, 58 y.o.   MRN: [id number]  Christina Meza is a 58 y.o. female presenting on 12/04/2024 for Medical Management of Chronic Issues   HPI  Discussed the use of AI scribe software for clinical note transcription with the patient, who gave verbal consent to proceed.  History of Present Illness   Christina Meza is a 58 year old female who presents for a medication follow-up and refills for depression management.  Anxiety / Chronic Depression recurrent, partial remission Insomnia On SNRI Duloxetine  with improvement. Especially improves her chronic pain. Still has anxiety and insomnia however  Has Clonazepam  AS NEEDED Symptoms stable.  - Significant fatigue persists, attributed to increased caregiving responsibilities for her mother - Prefers 90-day supply of duloxetine , received via mail through [facility]  Tremors - Clonazepam  used as needed for tremors, especially during activities requiring dexterity such as refilling her mother's medications - Tremor management previously included ropinirole , now discontinued - Currently taking pregabalin  25 mg twice daily for tremor control  Medication management - Takes hydroxychloroquine  and metoprolol  - Follow-up appointment scheduled for metoprolol  management - Upcoming insurance change in January may affect medication refills       SLE / Lupus Followed by [facility] - Dr Nat Baumgartner On medication management.    Health Maintenance: Due Prevnar 20 next visit     12/04/2024    3:10 PM 03/08/2024    9:37 AM 01/31/2024    1:16 PM  Depression screen PHQ 2/9  Decreased Interest 1 1 1   Down, Depressed, Hopeless 0 0 1  PHQ - 2 Score 1 1 2   Altered sleeping 0 0 0  Tired, decreased energy 2 2 1   Change in appetite 0 0 0  Feeling bad or failure about yourself  0 0 0  Trouble concentrating 0 0 1  Moving slowly or fidgety/restless 0 0 0   Suicidal thoughts 0 0 0  PHQ-9 Score 3 3  4    Difficult doing work/chores Not difficult at all Not difficult at all Not difficult at all     Data saved with a previous flowsheet row definition       03/08/2024    9:37 AM 12/23/2022   10:47 AM 04/21/2022    9:35 AM 06/17/2021    1:29 PM  GAD 7 : Generalized Anxiety Score  Nervous, Anxious, on Edge 0 2 0 3  Control/stop worrying 0 2 0 3  Worry too much - different things 0 2 1 3   Trouble relaxing 0 2 1 3   Restless 0 2 1 1   Easily annoyed or irritable 2 2 1 3   Afraid - awful might happen 0 1 0 2  Total GAD 7 Score 2 13 4 18   Anxiety Difficulty Not difficult at all Somewhat difficult Somewhat difficult Somewhat difficult    Social History[1]  Review of Systems Per HPI unless specifically indicated above     Objective:    BP 110/64 (BP Location: Left Arm, Patient Position: Sitting, Cuff Size: Normal)   Ht 5' 4 (1.626 m)   Wt 143 lb (64.9 kg)   LMP 07/15/2015   BMI 24.55 kg/m   Wt Readings from Last 3 Encounters:  12/04/24 143 lb (64.9 kg)  10/23/24 148 lb 3.2 oz (67.2 kg)  09/21/24 145 lb (65.8 kg)    Physical Exam Vitals and nursing note reviewed.  Constitutional:      General: She is not  in acute distress.    Appearance: Normal appearance. She is well-developed. She is not diaphoretic.     Comments: Well-appearing, comfortable, cooperative  HENT:     Head: Normocephalic and atraumatic.  Eyes:     General:        Right eye: No discharge.        Left eye: No discharge.     Conjunctiva/sclera: Conjunctivae normal.  Cardiovascular:     Rate and Rhythm: Normal rate.  Pulmonary:     Effort: Pulmonary effort is normal.  Skin:    General: Skin is warm and dry.     Findings: No erythema or rash.  Neurological:     Mental Status: She is alert and oriented to person, place, and time.  Psychiatric:        Mood and Affect: Mood normal.        Behavior: Behavior normal.        Thought Content: Thought content normal.      Comments: Well groomed, good eye contact, normal speech and thoughts     I have personally reviewed the radiology report from 01/24/17.  Hep B Surface Antibody Specimen: Blood Component Ref Range & Units 7 yr ago Comments  Hep B S Ab Nonreactive, Grayzone Reactive Abnormal   Nonreactive and Grayzone results are considered non-immune.  Hep B Surf Ab Quant <8.00 m(IU)/mL 122.90 High    Resulting Agency [facility]   Specimen Collected: 01/24/17 11:01 Last Resulted: 01/26/17 13:40  Received From: [facility]  Result Received: 06/28/19 07:18    Results for orders placed or performed during the hospital encounter of 08/30/24  GeneConnect Molecular Screen - Blood ([facility])   Collection Time: 08/30/24  8:12 AM  Result Value Ref Range   Genetic Analysis Overall Interpretation Negative    Genetic Disease Assessed      This is a screening test and does not detect all pathogenic or likely pathogenic variant(s) in the tested genes; diagnostic testing is recommended for individuals with a personal or family history of heart disease or hereditary cancer. Helix Tier One  Population Screen is a screening test that analyzes 11 genes related to hereditary breast and ovarian cancer (HBOC) syndrome, Lynch syndrome, and familial hypercholesterolemia. This test only reports clinically significant pathogenic and likely  pathogenic variants but does not report variants of uncertain significance (VUS). In addition, analysis of the PMS2 gene excludes exons 11-15, which overlap with a known pseudogene (PMS2CL).    Genetic Analysis Report      No pathogenic or likely pathogenic variants were detected in the genes analyzed by this test.Genetic test results should be interpreted in the context of an individual's personal medical and family history. Alteration to medical management is NOT  recommended based solely on this result. Clinical correlation is  advised.Additional Considerations- This is a screening test; individuals may still carry pathogenic or likely pathogenic variant(s) in the tested genes that are not detected by this test.-  For individuals at risk for these or other related conditions based on factors including personal or family history, diagnostic testing is recommended.- The absence of pathogenic or likely pathogenic variant(s) in the analyzed genes, while reassuring,  does not eliminate the possibility of a hereditary condition; there are other variants and genes associated with heart disease and hereditary cancer that are not included in this test.    Genes Tested See Notes    Disclaimer See Notes    Sequencing Location See Notes  Interpretation Methods and Limitations See Notes       Assessment & Plan:   Problem List Items Addressed This Visit     Anxiety   Relevant Medications   DULoxetine  (CYMBALTA ) 60 MG capsule   Major depressive disorder, recurrent episode, in partial remission - Primary   Relevant Medications   DULoxetine  (CYMBALTA ) 60 MG capsule     Major depressive disorder recurrent partial remission Anxiety Controlled in partial remission stable currently Persistent fatigue likely due to caregiving responsibilities. No significant changes in symptoms since April. - Prescribed duloxetine  6 mg daily for 90 days with zero refills. - Scheduled follow-up in March for medication refill and potential one-year supply.  General Health Maintenance Up to date with hepatitis B vaccination and titer. Discussed new pneumonia vaccination guidelines for age 40 and older. She is interested in Prevnar 20. - Administer Prevnar 20 pneumonia vaccine in March.        No orders of the defined types were placed in this encounter.   Meds ordered this encounter  Medications   DULoxetine  (CYMBALTA ) 60 MG capsule    Sig: Take 1 capsule (60 mg total) by mouth daily.    Dispense:  90 capsule    Refill:  0    Follow  up plan: Return in about 2 months (around 02/02/2025) for 2 months approx Tues 3/10 AM physical (refills for 1 year) then fasting lab after.  Marsa Officer, DO [facility] 12/04/2024, 3:09 PM     [1]  Social History Tobacco Use   Smoking status: Never   Smokeless tobacco: Never  Vaping Use   Vaping status: Never Used  Substance Use Topics   Alcohol use: Not Currently    Alcohol/week: 0.0 - 1.0 standard drinks of alcohol   Drug use: No   "

## 2024-12-17 ENCOUNTER — Other Ambulatory Visit: Payer: Self-pay | Admitting: Cardiology

## 2024-12-23 ENCOUNTER — Encounter: Payer: Self-pay | Admitting: Neurology

## 2024-12-24 ENCOUNTER — Other Ambulatory Visit (HOSPITAL_COMMUNITY): Payer: Self-pay

## 2024-12-24 ENCOUNTER — Encounter (HOSPITAL_COMMUNITY): Payer: Self-pay | Admitting: Pharmacist

## 2024-12-24 ENCOUNTER — Other Ambulatory Visit: Payer: Self-pay | Admitting: Neurology

## 2024-12-24 ENCOUNTER — Telehealth: Payer: Self-pay

## 2024-12-24 ENCOUNTER — Other Ambulatory Visit: Payer: Self-pay

## 2024-12-24 MED ORDER — PREGABALIN 25 MG PO CAPS: 25.0000 mg | ORAL_CAPSULE | Freq: Two times a day (BID) | ORAL | 5 refills | Status: DC

## 2024-12-24 MED ORDER — PREGABALIN 25 MG PO CAPS
25.0000 mg | ORAL_CAPSULE | Freq: Two times a day (BID) | ORAL | 5 refills | Status: AC
  Filled 2024-12-24 – 2024-12-25 (×2): qty 60, 30d supply, fill #0

## 2024-12-24 MED ORDER — METOPROLOL TARTRATE 25 MG PO TABS
25.0000 mg | ORAL_TABLET | Freq: Two times a day (BID) | ORAL | 3 refills | Status: AC
  Filled 2024-12-24 – 2024-12-25 (×2): qty 180, 90d supply, fill #0

## 2024-12-24 NOTE — Telephone Encounter (Signed)
" °  Dispenses   Dispensed Days Supply Quantity Provider Pharmacy  PREGABALIN  25MG      CAP 11/03/2024 30 60 each Camara, Amadou, MD [facility]...  PREGABALIN  25MG      CAP 08/21/2024 30 60 each Camara, Amadou, MD [facility]...  PREGABALIN  25MG      CAP 05/04/2024 90 180 each Camara, Amadou, MD [facility]...  PREGABALIN  25MG      CAP 02/15/2024 90 180 each Camara, Amadou, MD [facility]...    Last visit: 08/21/24 Next visit: 03/26/24   "

## 2024-12-25 ENCOUNTER — Other Ambulatory Visit (HOSPITAL_COMMUNITY): Payer: Self-pay

## 2024-12-25 ENCOUNTER — Other Ambulatory Visit: Payer: Self-pay

## 2024-12-25 MED ORDER — BENLYSTA 200 MG/ML ~~LOC~~ SOAJ: SUBCUTANEOUS | 11 refills | Status: DC

## 2024-12-25 NOTE — Telephone Encounter (Signed)
 Medication refill.

## 2024-12-26 ENCOUNTER — Other Ambulatory Visit: Payer: Self-pay | Admitting: Pharmacist

## 2024-12-26 ENCOUNTER — Ambulatory Visit: Payer: Self-pay | Attending: Family Medicine | Admitting: Pharmacist

## 2024-12-26 ENCOUNTER — Other Ambulatory Visit: Payer: Self-pay

## 2024-12-26 ENCOUNTER — Telehealth: Payer: Self-pay

## 2024-12-26 DIAGNOSIS — Z7189 Other specified counseling: Secondary | ICD-10-CM

## 2024-12-26 MED ORDER — BENLYSTA 200 MG/ML ~~LOC~~ SOAJ
SUBCUTANEOUS | 11 refills | Status: AC
  Filled 2024-12-28: qty 4, 28d supply, fill #0

## 2024-12-26 NOTE — Progress Notes (Signed)
 Pharmacy Patient Advocate Encounter  Insurance verification completed.   The patient is insured through [facility]   Ran test claim for Benlysta . PA required  This test claim was processed through [facility]- copay amounts may vary at other pharmacies due to pharmacy/plan contracts, or as the patient moves through the different stages of their insurance plan.

## 2024-12-26 NOTE — Telephone Encounter (Signed)
 Pharmacy Patient Advocate Encounter   Received notification from Pt Calls Messages that prior authorization for Benlysta  is required/requested.   Insurance verification completed.   The patient is insured through [facility].   Per test claim: PA required; PA submitted to above mentioned insurance via Latent Key/confirmation #/EOC [id number] Status is pending

## 2024-12-26 NOTE — Progress Notes (Signed)
 Benefits investigation started in new encounter

## 2024-12-26 NOTE — Progress Notes (Signed)
 See OV from 12/26/2024 for complete documentation.   Herlene Fleeta Morris, PharmD, JAQUELINE, CPP Clinical Pharmacist [facility] [phone]

## 2024-12-26 NOTE — Progress Notes (Signed)
 Pharmacy Note Subjective: Patient presents today to [facility] clinic for medication review. Patient is taking Benlysta  for systemic lupus erythematosus (SLE).  She is managed by Dr. Roylene for this.   Adherence: confirms   Efficacy: reports that the medication works well for her  Dosing: 200 mg subcutaneously once every 7 days.   Objective: CMP     Component Value Date/Time   NA 136 02/27/2024 0342   NA 140 12/07/2016 1537   K 4.2 02/27/2024 0342   CL 106 02/27/2024 0342   CO2 26 02/27/2024 0342   GLUCOSE 91 02/27/2024 0342   BUN 15 02/27/2024 0342   BUN 14 12/07/2016 1537   CREATININE 0.83 02/27/2024 0342   CREATININE 0.62 01/24/2024 0841   CALCIUM  8.3 (L) 02/27/2024 0342   PROT 5.7 (L) 02/26/2024 1413   PROT 7.1 12/07/2016 1537   ALBUMIN 3.3 (L) 02/26/2024 1413   ALBUMIN 3.9 12/07/2016 1537   AST 24 02/26/2024 1413   ALT 19 02/26/2024 1413   ALKPHOS 61 02/26/2024 1413    CBC    Component Value Date/Time   WBC 2.5 (L) 02/27/2024 0342   RBC 3.58 (L) 02/27/2024 0342   HGB 11.8 (L) 02/27/2024 0342   HGB 13.3 12/07/2016 1537   HCT 35.4 (L) 02/27/2024 0342   HCT 40.1 12/07/2016 1537   PLT 173 02/27/2024 0342   PLT 204 12/07/2016 1537   MCV 98.9 02/27/2024 0342   MCV 91 12/07/2016 1537   MCH 33.0 02/27/2024 0342   MCHC 33.3 02/27/2024 0342   RDW 14.1 02/27/2024 0342   RDW 14.2 12/07/2016 1537   LYMPHSABS 0.4 (L) 02/26/2024 1413   LYMPHSABS 0.8 12/07/2016 1537   MONOABS 0.6 02/26/2024 1413   EOSABS 0.0 02/26/2024 1413   EOSABS 0.0 12/07/2016 1537   BASOSABS 0.0 02/26/2024 1413   BASOSABS 0.0 12/07/2016 1537       Latest Ref Rng & Units 02/26/2024    2:13 PM  Serum Protein Electrophoresis  Total Protein 6.5 - 8.1 g/dL 5.7     Assessment/Plan:   Counseled patient that Benlysta  is a B cell inhibitor.  Counseled patient on purpose, proper use, and adverse effects of Benlysta .  Reviewed the most common adverse effects including infections, headache,  nausea/diarrhea and injection site reactions. Discussed that there is the possibility of an increased risk of malignancy but it is not well understood if this increased risk is due to the medication or the disease state.  Counseled patient that Benlysta  should be held prior to scheduled surgery.  Counseled patient to avoid live vaccines while on Benlysta . Recommend annual influenza, Pneumovax 23, Prevnar 13, and Shingrix as indicated.     Reviewed the importance of regular labs while on Benlysta  therapy.  Advised patient to get standing labs one month after starting Benlysta  then every 3 months.  Provided patient with standing lab orders. Provided patient with medication education material and answered all questions.  Patient voiced understanding.  Patient consented to Benlysta   Will upload consent into the media tab.  Reviewed storage instructions of Benlysta .  Patient voiced understanding.   Benlysta  dose will be 200mg  subcut once weekly.    Herlene Fleeta Morris, PharmD, JAQUELINE, CPP Clinical Pharmacist [facility] [phone]

## 2024-12-27 ENCOUNTER — Other Ambulatory Visit: Payer: Self-pay

## 2024-12-27 NOTE — Progress Notes (Signed)
 Pharmacy Patient Advocate Encounter  Insurance verification completed.   The patient is insured through [employer]   Ran test claim for Benlysta . Currently a quantity of 4 is a 28 day supply and the co-pay is $846.  Copay card brings copay to $821   This test claim was processed through [facility]- copay amounts may vary at other pharmacies due to pharmacy/plan contracts, or as the patient moves through the different stages of their insurance plan.

## 2024-12-27 NOTE — Telephone Encounter (Signed)
 Pharmacy Patient Advocate Encounter  Received notification from [facility] that Prior Authorization for Benlysta  has been APPROVED from 12/27/24 to 06/25/25   PA #/Case ID/Reference #: [id number]

## 2024-12-28 ENCOUNTER — Other Ambulatory Visit: Payer: Self-pay

## 2024-12-28 ENCOUNTER — Other Ambulatory Visit (HOSPITAL_COMMUNITY): Payer: Self-pay

## 2024-12-28 NOTE — Progress Notes (Signed)
 Specialty Pharmacy Initial Fill Coordination Note  Christina Meza is a 59 y.o. female contacted today regarding initial fill of specialty medication(s) Belimumab  (Benlysta )   Patient requested Delivery   Delivery date: 01/01/25   Verified address: [address]   Medication will be filled on: 12/31/24   Patient is aware of $821 copayment.

## 2024-12-28 NOTE — Progress Notes (Addendum)
 Patient has a high deductible plan and has agreed to $821 copay. Payment information on file.  Patient advised of possible shipment delays due to inclement weather

## 2024-12-31 ENCOUNTER — Other Ambulatory Visit: Payer: Self-pay

## 2025-01-06 ENCOUNTER — Other Ambulatory Visit (HOSPITAL_COMMUNITY): Payer: Self-pay

## 2025-02-12 ENCOUNTER — Encounter: Admitting: Family Medicine

## 2025-03-26 ENCOUNTER — Ambulatory Visit: Admitting: Neurology
